# Patient Record
Sex: Female | Born: 1935 | Race: White | Hispanic: No | Marital: Married | Smoking: Never smoker
Health system: Southern US, Community
[De-identification: ages and names within clinical notes are randomized; demographics above are authoritative.]

## PROBLEM LIST (undated history)

## (undated) DIAGNOSIS — K439 Ventral hernia without obstruction or gangrene: Secondary | ICD-10-CM

## (undated) DIAGNOSIS — J9690 Respiratory failure, unspecified, unspecified whether with hypoxia or hypercapnia: Secondary | ICD-10-CM

## (undated) DIAGNOSIS — M1990 Unspecified osteoarthritis, unspecified site: Secondary | ICD-10-CM

## (undated) DIAGNOSIS — I10 Essential (primary) hypertension: Secondary | ICD-10-CM

## (undated) DIAGNOSIS — I219 Acute myocardial infarction, unspecified: Secondary | ICD-10-CM

## (undated) DIAGNOSIS — J45909 Unspecified asthma, uncomplicated: Secondary | ICD-10-CM

## (undated) DIAGNOSIS — K219 Gastro-esophageal reflux disease without esophagitis: Secondary | ICD-10-CM

## (undated) HISTORY — DX: Unspecified asthma, uncomplicated: J45.909

## (undated) HISTORY — PX: TONSILLECTOMY: SUR1361

## (undated) HISTORY — DX: Acute myocardial infarction, unspecified: I21.9

## (undated) HISTORY — PX: TUBAL LIGATION: SHX77

## (undated) HISTORY — PX: APPENDECTOMY: SHX54

## (undated) HISTORY — PX: EYE SURGERY: SHX253

## (undated) HISTORY — PX: TOE SURGERY: SHX1073

## (undated) HISTORY — PX: CHOLECYSTECTOMY: SHX55

---

## 2005-07-13 ENCOUNTER — Ambulatory Visit: Payer: Self-pay | Admitting: Cardiology

## 2005-07-14 ENCOUNTER — Ambulatory Visit: Payer: Self-pay | Admitting: Cardiology

## 2005-07-21 ENCOUNTER — Ambulatory Visit: Payer: Self-pay | Admitting: Cardiology

## 2005-09-10 ENCOUNTER — Ambulatory Visit: Payer: Self-pay | Admitting: Cardiology

## 2010-12-01 ENCOUNTER — Other Ambulatory Visit (INDEPENDENT_AMBULATORY_CARE_PROVIDER_SITE_OTHER): Payer: Self-pay | Admitting: Internal Medicine

## 2010-12-01 ENCOUNTER — Ambulatory Visit (INDEPENDENT_AMBULATORY_CARE_PROVIDER_SITE_OTHER): Payer: Medicare HMO | Admitting: Internal Medicine

## 2010-12-01 DIAGNOSIS — R609 Edema, unspecified: Secondary | ICD-10-CM

## 2010-12-01 DIAGNOSIS — R1011 Right upper quadrant pain: Secondary | ICD-10-CM

## 2010-12-02 ENCOUNTER — Ambulatory Visit (HOSPITAL_COMMUNITY)
Admission: RE | Admit: 2010-12-02 | Discharge: 2010-12-02 | Disposition: A | Discharge status: Home / Self Care | Payer: Medicare HMO | Source: Ambulatory Visit | Attending: Internal Medicine | Admitting: Internal Medicine

## 2010-12-02 DIAGNOSIS — R609 Edema, unspecified: Secondary | ICD-10-CM

## 2010-12-02 DIAGNOSIS — I10 Essential (primary) hypertension: Secondary | ICD-10-CM | POA: Insufficient documentation

## 2010-12-02 DIAGNOSIS — R1011 Right upper quadrant pain: Secondary | ICD-10-CM | POA: Insufficient documentation

## 2011-01-28 ENCOUNTER — Other Ambulatory Visit (INDEPENDENT_AMBULATORY_CARE_PROVIDER_SITE_OTHER): Payer: Self-pay | Admitting: Internal Medicine

## 2011-01-28 ENCOUNTER — Ambulatory Visit (INDEPENDENT_AMBULATORY_CARE_PROVIDER_SITE_OTHER): Payer: Medicare HMO | Admitting: Internal Medicine

## 2011-01-28 DIAGNOSIS — K219 Gastro-esophageal reflux disease without esophagitis: Secondary | ICD-10-CM

## 2011-01-28 DIAGNOSIS — R1310 Dysphagia, unspecified: Secondary | ICD-10-CM

## 2011-02-01 ENCOUNTER — Ambulatory Visit (HOSPITAL_COMMUNITY)
Admission: RE | Admit: 2011-02-01 | Discharge: 2011-02-01 | Disposition: A | Discharge status: Home / Self Care | Payer: Medicare HMO | Source: Ambulatory Visit | Attending: Internal Medicine | Admitting: Internal Medicine

## 2011-02-01 DIAGNOSIS — R1310 Dysphagia, unspecified: Secondary | ICD-10-CM | POA: Insufficient documentation

## 2011-02-01 DIAGNOSIS — K449 Diaphragmatic hernia without obstruction or gangrene: Secondary | ICD-10-CM | POA: Insufficient documentation

## 2011-02-01 DIAGNOSIS — K219 Gastro-esophageal reflux disease without esophagitis: Secondary | ICD-10-CM | POA: Insufficient documentation

## 2011-02-02 ENCOUNTER — Telehealth (INDEPENDENT_AMBULATORY_CARE_PROVIDER_SITE_OTHER): Payer: Self-pay | Admitting: Internal Medicine

## 2011-02-02 NOTE — Telephone Encounter (Signed)
Message left at home for her to return call.  

## 2011-02-11 ENCOUNTER — Telehealth (INDEPENDENT_AMBULATORY_CARE_PROVIDER_SITE_OTHER): Payer: Self-pay | Admitting: Internal Medicine

## 2011-02-11 NOTE — Telephone Encounter (Signed)
Samples of Dexilant will be given to patient x 5 boxes. Message left on her phone

## 2011-11-02 ENCOUNTER — Encounter (HOSPITAL_COMMUNITY): Payer: Self-pay | Admitting: Pharmacy Technician

## 2011-11-03 ENCOUNTER — Encounter (HOSPITAL_COMMUNITY): Payer: Self-pay

## 2011-11-03 ENCOUNTER — Encounter (HOSPITAL_COMMUNITY)
Admission: RE | Admit: 2011-11-03 | Discharge: 2011-11-03 | Disposition: A | Discharge status: Home / Self Care | Payer: Medicare Other | Source: Ambulatory Visit | Attending: Ophthalmology | Admitting: Ophthalmology

## 2011-11-03 HISTORY — DX: Unspecified osteoarthritis, unspecified site: M19.90

## 2011-11-03 HISTORY — DX: Gastro-esophageal reflux disease without esophagitis: K21.9

## 2011-11-03 HISTORY — DX: Essential (primary) hypertension: I10

## 2011-11-03 HISTORY — DX: Ventral hernia without obstruction or gangrene: K43.9

## 2011-11-03 LAB — BASIC METABOLIC PANEL
BUN: 18 mg/dL (ref 6–23)
GFR calc Af Amer: 68 mL/min — ABNORMAL LOW (ref >90)
GFR calc non Af Amer: 59 mL/min — ABNORMAL LOW (ref >90)
Potassium: 4.5 mEq/L (ref 3.5–5.1)
Sodium: 139 mEq/L (ref 135–145)

## 2011-11-03 NOTE — Pre-Procedure Instructions (Signed)
Pt. BP on arrival for pre-op interview was 237/109.  On recheck after 15 min. BP was 207/91.  Pt. States she has been off BP medicine for over a year.  Has recently started taking BP medicine again, today being her first dose.  BP reviewed with Dr. Jayme Cloud & states ok for procedure.  Pt. Encouraged to continue taking BP medicine & to take the morning of procedure.

## 2011-11-03 NOTE — Patient Instructions (Signed)
20 Melissa Ross  11/03/2011   Your procedure is scheduled on:  Tuesday, 11/09/11  Report to [facility] at 0830 AM.  Call this number if you have problems the morning of surgery: [phone]   Remember:   Do not eat food:After Midnight.  May have clear liquids:until Midnight .  Clear liquids include soda, tea, black coffee, apple or grape juice, broth.  Take these medicines the morning of surgery with A SIP OF WATER: nexium and cozaar   Do not wear jewelry, make-up or nail polish.  Do not wear lotions, powders, or perfumes. You may wear deodorant.  Do not shave 48 hours prior to surgery.  Do not bring valuables to the hospital.  Contacts, dentures or bridgework may not be worn into surgery.  Leave suitcase in the car. After surgery it may be brought to your room.  For patients admitted to the hospital, checkout time is 11:00 AM the day of discharge.   Patients discharged the day of surgery will not be allowed to drive home.  Name and phone number of your driver: driver  Special Instructions: Use eye drops as instructed.   Please read over the following fact sheets that you were given: Pain Booklet, Anesthesia Post-op Instructions and Care and Recovery After Surgery   PATIENT INSTRUCTIONS POST-ANESTHESIA  IMMEDIATELY FOLLOWING SURGERY:  Do not drive or operate machinery for the first twenty four hours after surgery.  Do not make any important decisions for twenty four hours after surgery or while taking narcotic pain medications or sedatives.  If you develop intractable nausea and vomiting or a severe headache please notify your doctor immediately.  FOLLOW-UP:  Please make an appointment with your surgeon as instructed. You do not need to follow up with anesthesia unless specifically instructed to do so.  WOUND CARE INSTRUCTIONS (if applicable):  Keep a dry clean dressing on the anesthesia/puncture wound site if there is drainage.  Once the wound has quit draining you may leave it  open to air.  Generally you should leave the bandage intact for twenty four hours unless there is drainage.  If the epidural site drains for more than 36-48 hours please call the anesthesia department.  QUESTIONS?:  Please feel free to call your physician or the hospital operator if you have any questions, and they will be happy to assist you.         Cataract Surgery  A cataract is a clouding of the lens of the eye. When a lens becomes cloudy, vision is reduced based on the degree and nature of the clouding. Surgery may be needed to improve vision. Surgery removes the cloudy lens and usually replaces it with a substitute lens (intraocular lens, IOL). LET YOUR EYE DOCTOR KNOW ABOUT:  Allergies to food or medicine.   Medicines taken including herbs, eyedrops, over-the-counter medicines, and creams.   Use of steroids (by mouth or creams).   Previous problems with anesthetics or numbing medicine.   History of bleeding problems or blood clots.   Previous surgery.   Other health problems, including diabetes and kidney problems.   Possibility of pregnancy, if this applies.  RISKS AND COMPLICATIONS  Infection.   Inflammation of the eyeball (endophthalmitis) that can spread to both eyes (sympathetic ophthalmia).   Poor wound healing.   If an IOL is inserted, it can later fall out of proper position. This is very uncommon.   Clouding of the part of your eye that holds an IOL in place. This is  called an "after-cataract." These are uncommon, but easily treated.  BEFORE THE PROCEDURE  Do not eat or drink anything except small amounts of water for 8 to 12 before your surgery, or as directed by your caregiver.   Unless you are told otherwise, continue any eyedrops you have been prescribed.   Talk to your primary caregiver about all other medicines that you take (both prescription and non-prescription). In some cases, you may need to stop or change medicines near the time of your  surgery. This is most important if you are taking blood-thinning medicine.Do not stop medicines unless you are told to do so.   Arrange for someone to drive you to and from the procedure.   Do not put contact lenses in either eye on the day of your surgery.  PROCEDURE There is more than one method for safely removing a cataract. Your doctor can explain the differences and help determine which is best for you. Phacoemulsification surgery is the most common form of cataract surgery.  An injection is given behind the eye or eyedrops are given to make this a painless procedure.   A small cut (incision) is made on the edge of the clear, dome-shaped surface that covers the front of the eye (cornea).   A tiny probe is painlessly inserted into the eye. This device gives off ultrasound waves that soften and break up the cloudy center of the lens. This makes it easier for the cloudy lens to be removed by suction.   An IOL may be implanted.   The normal lens of the eye is covered by a clear capsule. Part of that capsule is intentionally left in the eye to support the IOL.   Your surgeon may or may not use stitches to close the incision.  There are other forms of cataract surgery that require a larger incision and stiches to close the eye. This approach is taken in cases where the doctor feels that the cataract cannot be easily removed using phacoemulsification. AFTER THE PROCEDURE  When an IOL is implanted, it does not need care. It becomes a permanent part of your eye and cannot be seen or felt.   Your doctor will schedule follow-up exams to check on your progress.   Review your other medicines with your doctor to see which can be resumed after surgery.   Use eyedrops or take medicine as prescribed by your doctor.  Document Released: 06/17/2011 Document Reviewed: 06/14/2011 [facility] Patient Information 2012 [place], [place].

## 2011-11-08 MED ORDER — CYCLOPENTOLATE-PHENYLEPHRINE 0.2-1 % OP SOLN
OPHTHALMIC | Status: AC
  Administered 2011-11-09: 1 [drp] via OPHTHALMIC
  Filled 2011-11-08: qty 2

## 2011-11-08 MED ORDER — FLURBIPROFEN SODIUM 0.03 % OP SOLN
OPHTHALMIC | Status: AC
  Administered 2011-11-09: 1 [drp] via OPHTHALMIC
  Filled 2011-11-08: qty 2.5

## 2011-11-08 MED ORDER — PHENYLEPHRINE HCL 2.5 % OP SOLN
OPHTHALMIC | Status: AC
  Administered 2011-11-09: 1 [drp] via OPHTHALMIC
  Filled 2011-11-08: qty 2

## 2011-11-08 MED ORDER — TETRACAINE HCL 0.5 % OP SOLN
OPHTHALMIC | Status: AC
  Administered 2011-11-09: 1 [drp] via OPHTHALMIC
  Filled 2011-11-08: qty 2

## 2011-11-09 ENCOUNTER — Encounter (HOSPITAL_COMMUNITY): Payer: Self-pay | Admitting: Anesthesiology

## 2011-11-09 ENCOUNTER — Encounter (HOSPITAL_COMMUNITY)
Admission: RE | Disposition: A | Discharge status: Home / Self Care | Payer: Self-pay | Source: Ambulatory Visit | Attending: Ophthalmology

## 2011-11-09 ENCOUNTER — Encounter (HOSPITAL_COMMUNITY): Payer: Self-pay | Admitting: *Deleted

## 2011-11-09 ENCOUNTER — Ambulatory Visit (HOSPITAL_COMMUNITY): Payer: Medicare Other | Admitting: Anesthesiology

## 2011-11-09 ENCOUNTER — Ambulatory Visit (HOSPITAL_COMMUNITY)
Admission: RE | Admit: 2011-11-09 | Discharge: 2011-11-09 | Disposition: A | Discharge status: Home / Self Care | Payer: Medicare Other | Source: Ambulatory Visit | Attending: Ophthalmology | Admitting: Ophthalmology

## 2011-11-09 DIAGNOSIS — Z79899 Other long term (current) drug therapy: Secondary | ICD-10-CM | POA: Insufficient documentation

## 2011-11-09 DIAGNOSIS — Z01812 Encounter for preprocedural laboratory examination: Secondary | ICD-10-CM | POA: Insufficient documentation

## 2011-11-09 DIAGNOSIS — H2510 Age-related nuclear cataract, unspecified eye: Secondary | ICD-10-CM | POA: Insufficient documentation

## 2011-11-09 DIAGNOSIS — I10 Essential (primary) hypertension: Secondary | ICD-10-CM | POA: Insufficient documentation

## 2011-11-09 DIAGNOSIS — Z01810 Encounter for preprocedural cardiovascular examination: Secondary | ICD-10-CM | POA: Insufficient documentation

## 2011-11-09 HISTORY — PX: CATARACT EXTRACTION W/PHACO: SHX586

## 2011-11-09 SURGERY — PHACOEMULSIFICATION, CATARACT, WITH IOL INSERTION
Anesthesia: Monitor Anesthesia Care | Site: Eye | Laterality: Right | Wound class: Clean

## 2011-11-09 MED ORDER — PHENYLEPHRINE HCL 2.5 % OP SOLN
1.0000 [drp] | OPHTHALMIC | Status: AC
  Administered 2011-11-09 (×3): 1 [drp] via OPHTHALMIC

## 2011-11-09 MED ORDER — EPINEPHRINE HCL 1 MG/ML IJ SOLN
INTRAMUSCULAR | Status: AC
  Filled 2011-11-09: qty 1

## 2011-11-09 MED ORDER — MIDAZOLAM HCL 2 MG/2ML IJ SOLN: 1.0000 mg | INTRAMUSCULAR | Status: DC | PRN

## 2011-11-09 MED ORDER — BSS IO SOLN
INTRAOCULAR | Status: DC | PRN
  Administered 2011-11-09: 15 mL via INTRAOCULAR

## 2011-11-09 MED ORDER — LIDOCAINE 3.5 % OP GEL OPTIME - NO CHARGE
OPHTHALMIC | Status: DC | PRN
  Administered 2011-11-09: 2 [drp] via OPHTHALMIC

## 2011-11-09 MED ORDER — PROVISC 10 MG/ML IO SOLN
INTRAOCULAR | Status: DC | PRN
  Administered 2011-11-09: 8.5 mg via INTRAOCULAR

## 2011-11-09 MED ORDER — LACTATED RINGERS IV SOLN
INTRAVENOUS | Status: DC
  Administered 2011-11-09: 10:00:00 via INTRAVENOUS

## 2011-11-09 MED ORDER — ONDANSETRON HCL 4 MG/2ML IJ SOLN: 4.0000 mg | Freq: Once | INTRAMUSCULAR | Status: DC | PRN

## 2011-11-09 MED ORDER — TETRACAINE 0.5 % OP SOLN OPTIME - NO CHARGE
OPHTHALMIC | Status: DC | PRN
  Administered 2011-11-09: 2 [drp] via OPHTHALMIC

## 2011-11-09 MED ORDER — FLURBIPROFEN SODIUM 0.03 % OP SOLN
1.0000 [drp] | OPHTHALMIC | Status: AC
  Administered 2011-11-09 (×3): 1 [drp] via OPHTHALMIC

## 2011-11-09 MED ORDER — LIDOCAINE HCL 3.5 % OP GEL: 1.0000 "application " | Freq: Once | OPHTHALMIC | Status: DC

## 2011-11-09 MED ORDER — TETRACAINE HCL 0.5 % OP SOLN
1.0000 [drp] | OPHTHALMIC | Status: AC
  Administered 2011-11-09 (×3): 1 [drp] via OPHTHALMIC

## 2011-11-09 MED ORDER — FENTANYL CITRATE 0.05 MG/ML IJ SOLN: 25.0000 ug | INTRAMUSCULAR | Status: DC | PRN

## 2011-11-09 MED ORDER — LACTATED RINGERS IV SOLN
INTRAVENOUS | Status: DC
  Administered 2011-11-09: 1000 mL via INTRAVENOUS

## 2011-11-09 MED ORDER — MIDAZOLAM HCL 2 MG/2ML IJ SOLN
1.0000 mg | INTRAMUSCULAR | Status: DC | PRN
  Administered 2011-11-09: 2 mg via INTRAVENOUS

## 2011-11-09 MED ORDER — MIDAZOLAM HCL 2 MG/2ML IJ SOLN
INTRAMUSCULAR | Status: AC
  Administered 2011-11-09: 2 mg via INTRAVENOUS
  Filled 2011-11-09: qty 2

## 2011-11-09 MED ORDER — CYCLOPENTOLATE-PHENYLEPHRINE 0.2-1 % OP SOLN
1.0000 [drp] | OPHTHALMIC | Status: AC
  Administered 2011-11-09 (×3): 1 [drp] via OPHTHALMIC

## 2011-11-09 MED ORDER — EPINEPHRINE HCL 1 MG/ML IJ SOLN
INTRAOCULAR | Status: DC | PRN
  Administered 2011-11-09: 10:00:00

## 2011-11-09 SURGICAL SUPPLY — 23 items
CAPSULAR TENSION RING-AMO (OPHTHALMIC RELATED) IMPLANT
CLOTH BEACON ORANGE TIMEOUT ST (SAFETY) ×2 IMPLANT
EYE SHIELD UNIVERSAL CLEAR (GAUZE/BANDAGES/DRESSINGS) ×4 IMPLANT
GLOVE BIO SURGEON STRL SZ 6.5 (GLOVE) ×2 IMPLANT
GLOVE ECLIPSE 6.5 STRL STRAW (GLOVE) IMPLANT
GLOVE ECLIPSE 7.0 STRL STRAW (GLOVE) IMPLANT
GLOVE EXAM NITRILE LRG STRL (GLOVE) IMPLANT
GLOVE EXAM NITRILE MD LF STRL (GLOVE) ×2 IMPLANT
GLOVE SKINSENSE NS SZ6.5 (GLOVE)
GLOVE SKINSENSE STRL SZ6.5 (GLOVE) IMPLANT
HEALON 5 0.6 ML (INTRAOCULAR LENS) IMPLANT
KIT VITRECTOMY (OPHTHALMIC RELATED) IMPLANT
PAD ARMBOARD 7.5X6 YLW CONV (MISCELLANEOUS) ×2 IMPLANT
PROC W NO LENS (INTRAOCULAR LENS)
PROC W SPEC LENS (INTRAOCULAR LENS)
PROCESS W NO LENS (INTRAOCULAR LENS) IMPLANT
PROCESS W SPEC LENS (INTRAOCULAR LENS) IMPLANT
RING MALYGIN (MISCELLANEOUS) IMPLANT
SIGHTPATH CAT PROC W REG LENS (Ophthalmic Related) ×2 IMPLANT
TAPE SURG TRANSPORE 1 IN (GAUZE/BANDAGES/DRESSINGS) ×1 IMPLANT
TAPE SURGICAL TRANSPORE 1 IN (GAUZE/BANDAGES/DRESSINGS) ×1
VISCOELASTIC ADDITIONAL (OPHTHALMIC RELATED) IMPLANT
WATER STERILE IRR 250ML POUR (IV SOLUTION) ×2 IMPLANT

## 2011-11-09 NOTE — Discharge Instructions (Signed)
Arabell Neria [facility]  11/09/2011           Mollie Germany Care Instructions [address]- [place] [address] [address]      1. Avoid closing eyes tightly. One often closes the eye tightly when laughing, talking, sneezing, coughing or if they feel irritated. At these times, you should be careful not to close your eyes tightly.  2. Instill one drop [place] in operative eye 3 times each day until the bottle is finished. To instill drops in your eye, open it, look up and have someone gently pull the lower lid down and instill a couple of drops inside the lower lid.  3. Do not touch upper lid.  4. Take Advil or Tylenol for pain.  5. You may use either eye for near work, such as reading or sewing and you may watch television.  6. You may have your hair done at the beauty parlor at any time.  7. Wear dark glasses with or without your own glasses if you are in bright light.  8. Call our office at [phone] or [phone] if you have sharp pain in your eye or unusual symptoms.  9. Do not be concerned because vision in the operative eye is not good. It will not be good, no matter how successful the operation, until you get a special lens for it. Your old glasses will not be suited to the new eye that was operated on and you will not be ready for a new lens for about a month.  10. Follow up at the [facility] office.    I have received a copy of the above instructions and will follow them.

## 2011-11-09 NOTE — Anesthesia Preprocedure Evaluation (Addendum)
Anesthesia Evaluation  Patient identified by MRN, date of birth, ID band Patient awake    Reviewed: Allergy & Precautions, H&P , NPO status , Patient's Chart, lab work & pertinent test results  Airway Mallampati: II      Dental  (+) Teeth Intact   Pulmonary neg pulmonary ROS,  breath sounds clear to auscultation        Cardiovascular hypertension, Pt. on medications Rhythm:Regular     Neuro/Psych    GI/Hepatic GERD-  Medicated,  Endo/Other    Renal/GU      Musculoskeletal   Abdominal   Peds  Hematology   Anesthesia Other Findings   Reproductive/Obstetrics                          Anesthesia Physical Anesthesia Plan  ASA: II  Anesthesia Plan: MAC   Post-op Pain Management:    Induction: Intravenous  Airway Management Planned: Nasal Cannula  Additional Equipment:   Intra-op Plan:   Post-operative Plan:   Informed Consent: I have reviewed the patients History and Physical, chart, labs and discussed the procedure including the risks, benefits and alternatives for the proposed anesthesia with the patient or authorized representative who has indicated his/her understanding and acceptance.     Plan Discussed with:   Anesthesia Plan Comments:         Anesthesia Quick Evaluation

## 2011-11-09 NOTE — Transfer of Care (Signed)
Immediate Anesthesia Transfer of Care Note  Patient: Melissa Ross  Procedure(s) Performed: Procedure(s) (LRB): CATARACT EXTRACTION PHACO AND INTRAOCULAR LENS PLACEMENT (IOC) (Right)  Patient Location: PACU and Short Stay  Anesthesia Type: MAC  Level of Consciousness: awake, alert , oriented and patient cooperative  Airway & Oxygen Therapy: Patient Spontanous Breathing  Post-op Assessment: Report given to PACU RN and Patient moving all extremities  Post vital signs: Reviewed and stable  Complications: No apparent anesthesia complications

## 2011-11-09 NOTE — Op Note (Signed)
Patient brought to the operating room and prepped and draped in the usual manner.  Lid speculum inserted in right eye.  Stab incision made at the twelve o'clock position.  Provisc instilled in the anterior chamber.   A 2.4 mm. Stab incision was made temporally.  An anterior capsulotomy was done with a bent 25 gauge needle.  The nucleus was hydrodissected.  The Phaco tip was inserted in the anterior chamber and the nucleus was emulsified.  CDE was 10.01.  The cortical material was then removed with the I and A tip.  Posterior capsule was the polished.  The anterior chamber was deepened with Provisc.  A 24.0 Diopter Rayner 570C IOL was then inserted in the capsular bag.  Provisc was then removed with the I and A tip.  The wound was then hydrated.  Patient sent to the Recovery Room in good condition with follow up in my office.

## 2011-11-09 NOTE — Anesthesia Postprocedure Evaluation (Signed)
  Anesthesia Post-op Note  Patient: Melissa Ross  Procedure(s) Performed: Procedure(s) (LRB): CATARACT EXTRACTION PHACO AND INTRAOCULAR LENS PLACEMENT (IOC) (Right)  Patient Location: PACU and Short Stay  Anesthesia Type: MAC  Level of Consciousness: awake, alert , oriented and patient cooperative  Airway and Oxygen Therapy: Patient Spontanous Breathing  Post-op Pain: 3 /10, mild  Post-op Assessment: Post-op Vital signs reviewed, Patient's Cardiovascular Status Stable, Respiratory Function Stable, Patent Airway and No signs of Nausea or vomiting  Post-op Vital Signs: Reviewed and stable  Complications: No apparent anesthesia complications

## 2011-11-09 NOTE — H&P (Signed)
The patient was re examined and there is no change in the patients condition since the original H and P. 

## 2011-11-11 ENCOUNTER — Encounter (HOSPITAL_COMMUNITY): Payer: Self-pay | Admitting: Ophthalmology

## 2012-05-11 ENCOUNTER — Encounter (HOSPITAL_COMMUNITY): Payer: Self-pay | Admitting: *Deleted

## 2012-05-11 ENCOUNTER — Encounter (HOSPITAL_COMMUNITY)
Admission: RE | Disposition: A | Discharge status: Home / Self Care | Payer: Self-pay | Source: Ambulatory Visit | Attending: Internal Medicine

## 2012-05-11 ENCOUNTER — Other Ambulatory Visit (INDEPENDENT_AMBULATORY_CARE_PROVIDER_SITE_OTHER): Payer: Self-pay | Admitting: *Deleted

## 2012-05-11 ENCOUNTER — Ambulatory Visit (HOSPITAL_COMMUNITY)
Admission: RE | Admit: 2012-05-11 | Discharge: 2012-05-11 | Disposition: A | Discharge status: Home / Self Care | Payer: Medicare Other | Source: Ambulatory Visit | Attending: Internal Medicine | Admitting: Internal Medicine

## 2012-05-11 DIAGNOSIS — IMO0002 Reserved for concepts with insufficient information to code with codable children: Secondary | ICD-10-CM

## 2012-05-11 DIAGNOSIS — K449 Diaphragmatic hernia without obstruction or gangrene: Secondary | ICD-10-CM

## 2012-05-11 DIAGNOSIS — I10 Essential (primary) hypertension: Secondary | ICD-10-CM | POA: Insufficient documentation

## 2012-05-11 DIAGNOSIS — R1310 Dysphagia, unspecified: Secondary | ICD-10-CM | POA: Insufficient documentation

## 2012-05-11 DIAGNOSIS — K2960 Other gastritis without bleeding: Secondary | ICD-10-CM

## 2012-05-11 DIAGNOSIS — K2970 Gastritis, unspecified, without bleeding: Secondary | ICD-10-CM | POA: Insufficient documentation

## 2012-05-11 DIAGNOSIS — K222 Esophageal obstruction: Secondary | ICD-10-CM

## 2012-05-11 DIAGNOSIS — K2990 Gastroduodenitis, unspecified, without bleeding: Secondary | ICD-10-CM | POA: Insufficient documentation

## 2012-05-11 HISTORY — PX: ESOPHAGOGASTRODUODENOSCOPY (EGD) WITH ESOPHAGEAL DILATION: SHX5812

## 2012-05-11 SURGERY — ESOPHAGOGASTRODUODENOSCOPY (EGD) WITH ESOPHAGEAL DILATION
Anesthesia: Moderate Sedation

## 2012-05-11 MED ORDER — MEPERIDINE HCL 50 MG/ML IJ SOLN
INTRAMUSCULAR | Status: AC
  Filled 2012-05-11: qty 1

## 2012-05-11 MED ORDER — MEPERIDINE HCL 25 MG/ML IJ SOLN
INTRAMUSCULAR | Status: DC | PRN
  Administered 2012-05-11 (×2): 25 mg via INTRAVENOUS

## 2012-05-11 MED ORDER — BUTAMBEN-TETRACAINE-BENZOCAINE 2-2-14 % EX AERO
INHALATION_SPRAY | CUTANEOUS | Status: DC | PRN
  Administered 2012-05-11: 2 via TOPICAL

## 2012-05-11 MED ORDER — SODIUM CHLORIDE 0.45 % IV SOLN
INTRAVENOUS | Status: DC
  Administered 2012-05-11: 14:00:00 via INTRAVENOUS

## 2012-05-11 MED ORDER — STERILE WATER FOR IRRIGATION IR SOLN
Status: DC | PRN
  Administered 2012-05-11: 15:00:00

## 2012-05-11 MED ORDER — MIDAZOLAM HCL 5 MG/5ML IJ SOLN
INTRAMUSCULAR | Status: DC | PRN
  Administered 2012-05-11: 2 mg via INTRAVENOUS
  Administered 2012-05-11 (×2): 1 mg via INTRAVENOUS

## 2012-05-11 MED ORDER — MIDAZOLAM HCL 5 MG/5ML IJ SOLN
INTRAMUSCULAR | Status: AC
  Filled 2012-05-11: qty 10

## 2012-05-11 NOTE — H&P (Addendum)
Melissa Ross is an 78 y.o. female.   Chief Complaint: Patient is here for EGD and ED. HPI: Patient is 78 year old Caucasian female with chronic GERD and has had intermittent episodes of dysphagia usually relieved spontaneously. This morning she was eating cornbread and could not swallow. She had hypersalivation. She was seen by Dr. Sherril Ross in sent over here for EGD. She is not having difficulty swallowing saliva anymore. Feels she has pass the food bolus. She complains of frequent burping and occasional nocturnal if she eats late. He does not experience heartburn during the daytime as long as she takes her Nexium. She denies abdominal pain or melena.  Past Medical History  Diagnosis Date  . Hypertension   . GERD (gastroesophageal reflux disease)   . Arthritis   . Ventral hernia     Past Surgical History  Procedure Date  . Cholecystectomy   . Tubal ligation   . Toe surgery     right foot  . Tonsillectomy   . Appendectomy   . Eye surgery     left KPE  . Cataract extraction w/phaco 11/09/2011    Procedure: CATARACT EXTRACTION PHACO AND INTRAOCULAR LENS PLACEMENT (IOC);  Surgeon: Loraine Leriche T. Nile Riggs, MD;  Location: [facility];  Service: Ophthalmology;  Laterality: Right;  CDE 10.01    Family History  Problem Relation Age of Onset  . Malignant hyperthermia Neg Hx   . Pseudochol deficiency Neg Hx   . Hypotension Neg Hx   . Anesthesia problems Neg Hx    Social History:  reports that she has never smoked. She does not have any smokeless tobacco history on file. She reports that she does not drink alcohol or use illicit drugs.  Allergies:  Allergies  Allergen Reactions  . Neosporin (Neomycin-Bacitracin Zn-Polymyx)   . Penicillins     Medications Prior to Admission  Medication Sig Dispense Refill  . cholecalciferol (VITAMIN D) 1000 UNITS tablet Take 1,000 Units by mouth daily.      Marland Kitchen losartan (COZAAR) 25 MG tablet Take 25 mg by mouth daily.      . vitamin B-12 (CYANOCOBALAMIN) 1000 MCG  tablet Take 1,000 mcg by mouth daily.      . vitamin C (ASCORBIC ACID) 500 MG tablet Take 500 mg by mouth daily.      Marland Kitchen esomeprazole (NEXIUM) 40 MG capsule Take 40 mg by mouth daily before breakfast.        No results found for this or any previous visit (from the past 48 hour(s)). No results found.  ROS  Blood pressure 196/94, pulse 85, temperature 97.6 F (36.4 C), temperature source Oral, resp. rate 21, height 5\' 9"  (1.753 m), weight 198 lb (89.812 kg), SpO2 99.00%. Physical Exam  Constitutional: She appears well-developed and well-nourished.  HENT:  Mouth/Throat: Oropharynx is clear and moist.  Eyes: Conjunctivae normal are normal. No scleral icterus.  Neck: No thyromegaly present.  Cardiovascular: Normal rate, regular rhythm and normal heart sounds.   No murmur heard. GI: Soft. She exhibits no distension and no mass. There is no tenderness.       Completely reproducible mid abdominal hernia.  Musculoskeletal: She exhibits no edema.  Lymphadenopathy:    She has no cervical adenopathy.  Neurological: She is alert.  Skin: Skin is warm and dry.     Assessment/Plan Solid food dysphagia. Food impaction seem to have cleared spontaneously. EGD with ED.  Melissa Ross U 05/11/2012, 3:17 PM

## 2012-05-11 NOTE — Op Note (Signed)
EGD PROCEDURE REPORT  PATIENT:  Melissa Ross  MR#:  [id number] Birthdate:  1931-01-01, 78 y.o., female Endoscopist:  Dr. Malissa Hippo, MD Referred By:  Dr. Ignatius Specking, MD Procedure Date: 05/11/2012  Procedure:   EGD with ED.  Indications:  Patient is 78 year old Caucasian female with chronic GERD who is been having intermittent solid food dysphagia. She presented to her primary care physician Dr. Sherril Croon this morning with signs and symptoms of food impaction. She she is able to swallow saliva. It is felt that she has spontaneously passed food bolus. She is undergoing therapeutic EGD.            Informed Consent:  The risks, benefits, alternatives & imponderables which include, but are not limited to, bleeding, infection, perforation, drug reaction and potential missed lesion have been reviewed.  The potential for biopsy, lesion removal, esophageal dilation, etc. have also been discussed.  Questions have been answered.  All parties agreeable.  Please see history & physical in medical record for more information.  Medications:  Demerol 50 mg IV Versed 4 mg IV Cetacaine spray topically for oropharyngeal anesthesia  Description of procedure:  The endoscope was introduced through the mouth and advanced to the second portion of the duodenum without difficulty or limitations. The mucosal surfaces were surveyed very carefully during advancement of the scope and upon withdrawal.  Findings:  Esophagus:  Mucosa of the esophagus was normal. Somewhat tortuous body. Stricture noted at GE junction with focal erythema. I was able to pass the scope across it without any difficulty. GEJ:  32 cm Hiatus:  36 cm Stomach: There was some bile in the stomach which was suctioned out. Stomach distended very well with insufflation. Folds in the proximal stomach were normal. Examination mucosa at body was normal. Few small telangiectasia noted along with areas of erythema and granularity. Pyloric channel was  patent. Angularis fundus and cardia were examined by retroflexing the scope. Hernia was easily seen on this view.  Duodenum:  Normal bulbar and post bulbar mucosa.  Therapeutic/Diagnostic Maneuvers Performed:  Distal esophageal stricture was dilated with a balloon. Balloon dilator was passed of the scope. Guidewire was pushed into gastric lumen. Balloon dilator was positioned across the stricture and insufflated to a diameter of 18 mm and subsequently 19 mm. Long was passed distally. Mucosal disruption noted at 2 and 6 o'clock. Balloon was deflated and withdrawn. Stomach was decompressed the endoscope was withdrawn.  Complications:  None  Impression:  Stricture at GE junction. Was dilated with a balloon to 19 mm. Moderate size sliding hiatal hernia. Few antral telangiectasia along with changes of nonerosive gastritis.  Recommendations:  Standard instructions given. H. pylori serology.   Keishon Chavarin U  05/11/2012  3:59 PM  CC: Dr. Ignatius Specking., MD & Dr. Bonnetta Barry ref. provider found

## 2012-05-12 LAB — H. PYLORI ANTIBODY, IGG: H Pylori IgG: 1.08 {ISR} — ABNORMAL HIGH

## 2012-05-18 ENCOUNTER — Other Ambulatory Visit (INDEPENDENT_AMBULATORY_CARE_PROVIDER_SITE_OTHER): Payer: Self-pay | Admitting: Internal Medicine

## 2012-05-18 DIAGNOSIS — A048 Other specified bacterial intestinal infections: Secondary | ICD-10-CM

## 2012-05-18 MED ORDER — BIS SUBCIT-METRONID-TETRACYC 140-125-125 MG PO CAPS: 3.0000 | ORAL_CAPSULE | Freq: Three times a day (TID) | ORAL | Status: DC

## 2012-05-22 ENCOUNTER — Telehealth (INDEPENDENT_AMBULATORY_CARE_PROVIDER_SITE_OTHER): Payer: Self-pay | Admitting: *Deleted

## 2012-05-22 NOTE — Telephone Encounter (Signed)
Patient called and states that she got the Pylera, after reading the side effects bad side effects she is very concerned about taking the medication. She states in her message that she has some problems already and does not want to take any chances. Ms. Rundell is asking for a call back to help her understand this medication. I call her at the number she provided and it went to voicemail. Told her that Dr.Rehman would be made aware and either he or myself would call her back.

## 2012-05-23 NOTE — Telephone Encounter (Signed)
Per Dr.Rehman on 05/24/11 may call in Prevpac , Use as directed #1 This was called to Melissa Ross's/Melissa Ross Patient was called and a message was left, asking that she call me.

## 2012-06-22 ENCOUNTER — Encounter (INDEPENDENT_AMBULATORY_CARE_PROVIDER_SITE_OTHER): Payer: Self-pay | Admitting: General Surgery

## 2012-06-22 ENCOUNTER — Ambulatory Visit (INDEPENDENT_AMBULATORY_CARE_PROVIDER_SITE_OTHER): Payer: Medicare Other | Admitting: General Surgery

## 2012-06-22 VITALS — BP 120/72 | HR 84 | Resp 16 | Ht 68.0 in | Wt 200.0 lb

## 2012-06-22 DIAGNOSIS — K432 Incisional hernia without obstruction or gangrene: Secondary | ICD-10-CM | POA: Insufficient documentation

## 2012-06-22 NOTE — Patient Instructions (Signed)
We will get a CT scan to investigate the hernia and I will call you with the results

## 2012-06-22 NOTE — Progress Notes (Signed)
Patient ID: Melissa Ross, female   DOB: [birth year], 78 y.o.   MRN: [id number]  Chief Complaint  Patient presents with  . Hernia    HPI Melissa Ross is a 78 y.o. female.   HPI 78 year old Caucasian female comes in complaining of abdominal hernia. She states that shortly after her gallbladder surgery in 2010 she noticed a small bulge underneath her supraumbilical incision. She states over the past several years the bulge has gotten significantly larger. It tends to cause her more pain and discomfort first thing in the morning when she gets up. She states that it looks like her stomach is about to fall out of her abdomen. She states that it also causes discomfort when she coughs. She has chronic constipation-having one bowel movement per week. She also has significant reflux. She recently had her esophagus dilated at the end of October. Since having her esophagus dilated she hasn't had as much vomiting. She denies any weight loss. She denies any melena or hematochezia. She has had a prior tubal ligation and appendectomy. Her gallbladder surgery was performed at [facility]. Past Medical History  Diagnosis Date  . Hypertension   . GERD (gastroesophageal reflux disease)   . Arthritis   . Ventral hernia   . Asthma     Past Surgical History  Procedure Date  . Cholecystectomy   . Tubal ligation   . Toe surgery     right foot  . Tonsillectomy   . Appendectomy   . Eye surgery     left KPE  . Cataract extraction w/phaco 11/09/2011    Procedure: CATARACT EXTRACTION PHACO AND INTRAOCULAR LENS PLACEMENT (IOC);  Surgeon: Loraine Leriche T. Nile Riggs, MD;  Location: [facility];  Service: Ophthalmology;  Laterality: Right;  CDE 10.01  . Esophagogastroduodenoscopy (egd) with esophageal dilation 05/11/2012    Procedure: ESOPHAGOGASTRODUODENOSCOPY (EGD) WITH ESOPHAGEAL DILATION;  Surgeon: Malissa Hippo, MD;  Location: [facility] END[facility] SUITE;  Service: Endoscopy;  Laterality: N/A;    Family History  Problem  Relation Age of Onset  . Malignant hyperthermia Neg Hx   . Pseudochol deficiency Neg Hx   . Hypotension Neg Hx   . Anesthesia problems Neg Hx   . Breast cancer    . Ovarian cancer    . Colon cancer      Social History History  Substance Use Topics  . Smoking status: Never Smoker   . Smokeless tobacco: Not on file  . Alcohol Use: No    Allergies  Allergen Reactions  . Neosporin (Neomycin-Bacitracin Zn-Polymyx)   . Penicillins     Current Outpatient Prescriptions  Medication Sig Dispense Refill  . cholecalciferol (VITAMIN D) 1000 UNITS tablet Take 1,000 Units by mouth daily.      Marland Kitchen esomeprazole (NEXIUM) 40 MG capsule Take 40 mg by mouth daily before breakfast.      . losartan (COZAAR) 25 MG tablet Take 25 mg by mouth daily.      . vitamin B-12 (CYANOCOBALAMIN) 1000 MCG tablet Take 1,000 mcg by mouth daily.      . vitamin C (ASCORBIC ACID) 500 MG tablet Take 500 mg by mouth daily.        Review of Systems Review of Systems  Constitutional: Negative for fever, chills, activity change, appetite change and unexpected weight change.  HENT: Positive for hearing loss. Negative for facial swelling and neck pain.   Eyes: Negative for photophobia and visual disturbance.  Respiratory: Negative for chest tightness and shortness of breath.   Cardiovascular:  Positive for leg swelling. Negative for chest pain and palpitations.       Denies CP, SOB, orthopnea, PND. Sleeps on 4 pillows b/c of reflux  Gastrointestinal: Positive for vomiting and constipation. Negative for nausea and diarrhea.       See hpi  Genitourinary: Negative for dysuria and difficulty urinating.  Musculoskeletal:       OA pains  Neurological: Negative for dizziness, tremors, seizures, weakness, light-headedness and numbness.       No TIA, amaurosis fugax  Hematological: Does not bruise/bleed easily.  Psychiatric/Behavioral: Negative for confusion.    Blood pressure 120/72, pulse 84, resp. rate 16, height 5\' 8"   (1.727 m), weight 200 lb (90.719 kg).  Physical Exam Physical Exam  Vitals reviewed. Constitutional: She is oriented to person, place, and time. She appears well-developed and well-nourished. No distress.       obese  HENT:  Head: Normocephalic and atraumatic.  Right Ear: External ear normal.  Left Ear: External ear normal.  Eyes: Conjunctivae normal are normal. No scleral icterus.  Neck: Normal range of motion. Neck supple. No tracheal deviation present. No thyromegaly present.  Cardiovascular: Normal rate, regular rhythm, normal heart sounds and intact distal pulses.   Pulmonary/Chest: Effort normal and breath sounds normal. No respiratory distress. She has no wheezes. She has no rales.  Abdominal: Soft. Bowel sounds are normal. She exhibits no distension. There is no rebound and no guarding.         Examined supine and standing - small supraumbilical incision. Bulge above umbilicus, more pronounced standing; reducible. Difficult to palpate fasical borders. Some component of diastasis in upper midline. Mild TTP over hernia  Musculoskeletal: She exhibits no edema and no tenderness.  Neurological: She is alert and oriented to person, place, and time. She exhibits normal muscle tone.  Skin: Skin is warm and dry. No rash noted. She is not diaphoretic. No erythema.  Psychiatric: She has a normal mood and affect. Her behavior is normal. Judgment and thought content normal.    Data Reviewed EGD note - esophageal stricture - dilated  Assessment    Incisional ventral hernia Diastasis of upper midline    Plan    We discussed the etiology of ventral hernias. We discussed the signs and symptoms of incarceration and strangulation. The patient was given educational material. I also drew diagrams. She also has some evidence of a diastases of the hernia which makes it a little bit challenging on physical exam to delineate the fascial defect.  We discussed nonoperative and operative  management. With respect to operative management, we discussed both open repair and laparoscopic repair. We discussed the pros and cons of each approach. I discussed the typical aftercare with each procedure and how each procedure differs.  The patient has elected to first have a CT scan of her abdomen pelvis to delineate her abdominal wall anatomy. She is leaning toward a laparoscopic repair.  We discussed the risk and benefits of surgery including but not limited to bleeding, infection, injury to surrounding structures, hernia recurrence, mesh complications, hematoma/seroma formation, need to convert to an open procedure, blood clot formation, urinary retention, post operative ileus, general anesthesia risk, long-term abdominal pain. We discussed that this procedure can be quite uncomfortable and difficult to recover from based on how the mesh is secured to the abdominal wall. We discussed the importance of avoiding heavy lifting and straining for a period of 6 weeks.  She will be scheduled for a CT scan next week and I  will call her with the results and we will go from there. She was accompanied by her son today who is in agreement with the plan  [name]. Andrey Campanile, MD, FACS General, Bariatric, & Minimally Invasive Surgery [facility], [place]        [facility] M 06/22/2012, 2:09 PM

## 2012-06-26 ENCOUNTER — Ambulatory Visit
Admission: RE | Admit: 2012-06-26 | Discharge: 2012-06-26 | Disposition: A | Discharge status: Home / Self Care | Payer: Medicare Other | Source: Ambulatory Visit | Attending: General Surgery | Admitting: General Surgery

## 2012-06-26 DIAGNOSIS — K432 Incisional hernia without obstruction or gangrene: Secondary | ICD-10-CM

## 2012-06-26 MED ORDER — IOHEXOL 300 MG/ML  SOLN
125.0000 mL | Freq: Once | INTRAMUSCULAR | Status: AC | PRN
  Administered 2012-06-26: 125 mL via INTRAVENOUS

## 2012-06-28 ENCOUNTER — Telehealth (INDEPENDENT_AMBULATORY_CARE_PROVIDER_SITE_OTHER): Payer: Self-pay | Admitting: General Surgery

## 2012-06-28 NOTE — Telephone Encounter (Signed)
Spoke with patient and made her aware CT does show hernia. She does not know whether she wants a laparoscopic or open repair. He wants to know exactly how large this area is and wants to know what all her other organs look like. She wanted to speak directly with Dr Andrey Campanile about her scan and she told me she would let him know which surgery she wants. I told her I would pass the message to him.

## 2012-06-28 NOTE — Telephone Encounter (Signed)
Message copied by Liliana Cline on Wed Jun 28, 2012  3:29 PM ------      Message from: Zacarias Pontes      Created: Wed Jun 28, 2012  3:11 PM       Pt would like a call back with her CT Scan results please...[phone]..thanks

## 2012-06-29 ENCOUNTER — Other Ambulatory Visit (INDEPENDENT_AMBULATORY_CARE_PROVIDER_SITE_OTHER): Payer: Self-pay | Admitting: General Surgery

## 2012-06-29 NOTE — Progress Notes (Signed)
Patient ID: Melissa Ross, female   DOB: 04/19/1931, 78 y.o.   MRN: [id number] I spoke with patient and just now to discuss the results of her CT scan which confirmed our suspicions of a ventral incisional hernia. I explained that it roughly measured 5 cm x 4.5 cm. I explained that it contained a portion of colon that appeared to be nonobstructive. The patient has agreed to proceed with surgical repair. She did not have a hernia in her upper midline area. She just has this hernia supraumbilically. I explained that it would be performed after the first of the year. We reviewed signs and symptoms of incarceration strangulation. I encouraged the patient to contact our office should she have additional questions. Our schedulers will contact the patient to schedule surgery.

## 2012-07-03 ENCOUNTER — Encounter (HOSPITAL_COMMUNITY): Payer: Self-pay | Admitting: Pharmacy Technician

## 2012-07-11 ENCOUNTER — Ambulatory Visit (HOSPITAL_COMMUNITY)
Admission: RE | Admit: 2012-07-11 | Discharge: 2012-07-11 | Disposition: A | Discharge status: Home / Self Care | Payer: Medicare Other | Source: Ambulatory Visit | Attending: General Surgery | Admitting: General Surgery

## 2012-07-11 ENCOUNTER — Encounter (HOSPITAL_COMMUNITY)
Admission: RE | Admit: 2012-07-11 | Discharge: 2012-07-11 | Disposition: A | Discharge status: Home / Self Care | Payer: Medicare Other | Source: Ambulatory Visit | Attending: General Surgery | Admitting: General Surgery

## 2012-07-11 ENCOUNTER — Encounter (HOSPITAL_COMMUNITY): Payer: Self-pay

## 2012-07-11 DIAGNOSIS — Z01818 Encounter for other preprocedural examination: Secondary | ICD-10-CM | POA: Insufficient documentation

## 2012-07-11 DIAGNOSIS — K449 Diaphragmatic hernia without obstruction or gangrene: Secondary | ICD-10-CM | POA: Insufficient documentation

## 2012-07-11 LAB — CBC WITH DIFFERENTIAL/PLATELET
Eosinophils Absolute: 0.2 10*3/uL (ref 0.0–0.7)
Lymphs Abs: 1.5 10*3/uL (ref 0.7–4.0)
MCH: 30.3 pg (ref 26.0–34.0)
Neutrophils Relative %: 51 % (ref 43–77)
Platelets: 265 10*3/uL (ref 150–400)
RBC: 4.42 MIL/uL (ref 3.87–5.11)
WBC: 4.8 10*3/uL (ref 4.0–10.5)

## 2012-07-11 LAB — COMPREHENSIVE METABOLIC PANEL
ALT: 12 U/L (ref 0–35)
Albumin: 4 g/dL (ref 3.5–5.2)
Alkaline Phosphatase: 154 U/L — ABNORMAL HIGH (ref 39–117)
GFR calc Af Amer: 56 mL/min — ABNORMAL LOW (ref >90)
Glucose, Bld: 113 mg/dL — ABNORMAL HIGH (ref 70–99)
Potassium: 3.5 mEq/L (ref 3.5–5.1)
Sodium: 140 mEq/L (ref 135–145)
Total Protein: 7.2 g/dL (ref 6.0–8.3)

## 2012-07-11 LAB — SURGICAL PCR SCREEN: MRSA, PCR: NEGATIVE

## 2012-07-11 NOTE — Progress Notes (Signed)
Pt c/o n/v yest and today. She is to call dos if this cont. Surgery is 07/17/12

## 2012-07-11 NOTE — Pre-Procedure Instructions (Signed)
20 Melissa Ross  07/11/2012   Your procedure is scheduled on:  07/17/12  Report to [facility] at 900 AM.  Call this number if you have problems the morning of surgery: [phone]   Remember:   Do not eat food:After Midnight.  Take these medicines the morning of surgery with A SIP OF WATER: nexium   Do not wear jewelry, make-up or nail polish.  Do not wear lotions, powders, or perfumes. You may wear deodorant.  Do not shave 48 hours prior to surgery. Men may shave face and neck.  Do not bring valuables to the hospital.  Contacts, dentures or bridgework may not be worn into surgery.  Leave suitcase in the car. After surgery it may be brought to your room.  For patients admitted to the hospital, checkout time is 11:00 AM the day of discharge.   Patients discharged the day of surgery will not be allowed to drive home.  Name and phone number of your driver: family  Special Instructions: Shower using CHG 2 nights before surgery and the night before surgery.  If you shower the day of surgery use CHG.  Use special wash - you have one bottle of CHG for all showers.  You should use approximately 1/3 of the bottle for each shower.   Please read over the following fact sheets that you were given: Pain Booklet, Coughing and Deep Breathing, MRSA Information and Surgical Site Infection Prevention

## 2012-07-14 NOTE — Progress Notes (Signed)
Patient notified of new arrival time of 05:30 

## 2012-07-16 MED ORDER — VANCOMYCIN HCL IN DEXTROSE 1-5 GM/200ML-% IV SOLN
1000.0000 mg | INTRAVENOUS | Status: AC
  Administered 2012-07-17: 1000 mg via INTRAVENOUS
  Filled 2012-07-16: qty 200

## 2012-07-17 ENCOUNTER — Inpatient Hospital Stay (HOSPITAL_COMMUNITY)
Admission: RE | Admit: 2012-07-17 | Discharge: 2012-07-20 | DRG: 355 | Disposition: A | Discharge status: Skilled Nursing Facility | Payer: Medicare Other | Source: Ambulatory Visit | Attending: General Surgery | Admitting: General Surgery

## 2012-07-17 ENCOUNTER — Encounter (HOSPITAL_COMMUNITY): Payer: Self-pay | Admitting: Anesthesiology

## 2012-07-17 ENCOUNTER — Encounter (HOSPITAL_COMMUNITY): Payer: Self-pay | Admitting: General Practice

## 2012-07-17 ENCOUNTER — Encounter (HOSPITAL_COMMUNITY)
Admission: RE | Disposition: A | Discharge status: Skilled Nursing Facility | Payer: Self-pay | Source: Ambulatory Visit | Attending: General Surgery

## 2012-07-17 ENCOUNTER — Inpatient Hospital Stay (HOSPITAL_COMMUNITY): Payer: Medicare Other | Admitting: Anesthesiology

## 2012-07-17 DIAGNOSIS — K432 Incisional hernia without obstruction or gangrene: Secondary | ICD-10-CM

## 2012-07-17 DIAGNOSIS — H9190 Unspecified hearing loss, unspecified ear: Secondary | ICD-10-CM | POA: Diagnosis present

## 2012-07-17 DIAGNOSIS — J45909 Unspecified asthma, uncomplicated: Secondary | ICD-10-CM | POA: Diagnosis present

## 2012-07-17 DIAGNOSIS — Z888 Allergy status to other drugs, medicaments and biological substances status: Secondary | ICD-10-CM

## 2012-07-17 DIAGNOSIS — M129 Arthropathy, unspecified: Secondary | ICD-10-CM | POA: Diagnosis present

## 2012-07-17 DIAGNOSIS — Z880 Allergy status to penicillin: Secondary | ICD-10-CM

## 2012-07-17 DIAGNOSIS — R1110 Vomiting, unspecified: Secondary | ICD-10-CM | POA: Diagnosis present

## 2012-07-17 DIAGNOSIS — K219 Gastro-esophageal reflux disease without esophagitis: Secondary | ICD-10-CM | POA: Diagnosis present

## 2012-07-17 DIAGNOSIS — Z79899 Other long term (current) drug therapy: Secondary | ICD-10-CM

## 2012-07-17 DIAGNOSIS — K5909 Other constipation: Secondary | ICD-10-CM | POA: Diagnosis present

## 2012-07-17 DIAGNOSIS — I10 Essential (primary) hypertension: Secondary | ICD-10-CM | POA: Diagnosis present

## 2012-07-17 DIAGNOSIS — Z9089 Acquired absence of other organs: Secondary | ICD-10-CM

## 2012-07-17 HISTORY — PX: INSERTION OF MESH: SHX5868

## 2012-07-17 HISTORY — PX: VENTRAL HERNIA REPAIR: SHX424

## 2012-07-17 SURGERY — REPAIR, HERNIA, VENTRAL, LAPAROSCOPIC
Anesthesia: General | Site: Abdomen | Wound class: Clean

## 2012-07-17 MED ORDER — LIDOCAINE HCL (CARDIAC) 20 MG/ML IV SOLN
INTRAVENOUS | Status: DC | PRN
  Administered 2012-07-17: 100 mg via INTRAVENOUS

## 2012-07-17 MED ORDER — PROMETHAZINE HCL 25 MG/ML IJ SOLN
6.2500 mg | INTRAMUSCULAR | Status: DC | PRN
  Administered 2012-07-17: 12.5 mg via INTRAVENOUS

## 2012-07-17 MED ORDER — SUCCINYLCHOLINE CHLORIDE 20 MG/ML IJ SOLN
INTRAMUSCULAR | Status: DC | PRN
  Administered 2012-07-17: 100 mg via INTRAVENOUS

## 2012-07-17 MED ORDER — OXYCODONE HCL 5 MG PO TABS: 5.0000 mg | ORAL_TABLET | Freq: Once | ORAL | Status: DC | PRN

## 2012-07-17 MED ORDER — BUPIVACAINE 0.25 % ON-Q PUMP DUAL CATH 300 ML
300.0000 mL | INJECTION | Status: DC
  Filled 2012-07-17: qty 300

## 2012-07-17 MED ORDER — BUPIVACAINE 0.25 % ON-Q PUMP SINGLE CATH 300ML
INJECTION | Status: DC | PRN
  Administered 2012-07-17: 300 mL

## 2012-07-17 MED ORDER — LABETALOL HCL 5 MG/ML IV SOLN
INTRAVENOUS | Status: AC
  Filled 2012-07-17: qty 4

## 2012-07-17 MED ORDER — PROPOFOL 10 MG/ML IV BOLUS
INTRAVENOUS | Status: DC | PRN
  Administered 2012-07-17: 170 mg via INTRAVENOUS

## 2012-07-17 MED ORDER — KCL IN DEXTROSE-NACL 20-5-0.45 MEQ/L-%-% IV SOLN
INTRAVENOUS | Status: DC
Start: 2012-07-17 — End: 2012-07-20
  Administered 2012-07-17 – 2012-07-18 (×4): via INTRAVENOUS
  Filled 2012-07-17 (×8): qty 1000

## 2012-07-17 MED ORDER — BUPIVACAINE-EPINEPHRINE PF 0.25-1:200000 % IJ SOLN
INTRAMUSCULAR | Status: AC
  Filled 2012-07-17: qty 30

## 2012-07-17 MED ORDER — ROCURONIUM BROMIDE 100 MG/10ML IV SOLN
INTRAVENOUS | Status: DC | PRN
  Administered 2012-07-17: 40 mg via INTRAVENOUS

## 2012-07-17 MED ORDER — HYDROMORPHONE HCL PF 1 MG/ML IJ SOLN
INTRAMUSCULAR | Status: AC
  Filled 2012-07-17: qty 1

## 2012-07-17 MED ORDER — SODIUM CHLORIDE 0.9 % IV SOLN
INTRAVENOUS | Status: DC | PRN
  Administered 2012-07-17: 08:00:00 via INTRAVENOUS

## 2012-07-17 MED ORDER — LOSARTAN POTASSIUM 25 MG PO TABS
25.0000 mg | ORAL_TABLET | Freq: Every day | ORAL | Status: DC
  Administered 2012-07-17 – 2012-07-20 (×4): 25 mg via ORAL
  Filled 2012-07-17 (×4): qty 1

## 2012-07-17 MED ORDER — NEOSTIGMINE METHYLSULFATE 1 MG/ML IJ SOLN
INTRAMUSCULAR | Status: DC | PRN
  Administered 2012-07-17: 4 mg via INTRAVENOUS

## 2012-07-17 MED ORDER — ONDANSETRON HCL 4 MG/2ML IJ SOLN
4.0000 mg | Freq: Four times a day (QID) | INTRAMUSCULAR | Status: DC | PRN
  Administered 2012-07-19: 4 mg via INTRAVENOUS
  Filled 2012-07-17: qty 2

## 2012-07-17 MED ORDER — ONDANSETRON HCL 4 MG PO TABS: 4.0000 mg | ORAL_TABLET | Freq: Four times a day (QID) | ORAL | Status: DC | PRN

## 2012-07-17 MED ORDER — ONDANSETRON HCL 4 MG/2ML IJ SOLN
INTRAMUSCULAR | Status: DC | PRN
  Administered 2012-07-17: 4 mg via INTRAVENOUS

## 2012-07-17 MED ORDER — BUPIVACAINE 0.25 % ON-Q PUMP SINGLE CATH 300ML
300.0000 mL | INJECTION | Status: DC
  Filled 2012-07-17: qty 300

## 2012-07-17 MED ORDER — PROMETHAZINE HCL 25 MG/ML IJ SOLN
INTRAMUSCULAR | Status: AC
  Filled 2012-07-17: qty 1

## 2012-07-17 MED ORDER — SODIUM CHLORIDE 0.9 % IR SOLN
Status: DC | PRN
  Administered 2012-07-17: 1000 mL

## 2012-07-17 MED ORDER — HEPARIN SODIUM (PORCINE) 5000 UNIT/ML IJ SOLN
5000.0000 [IU] | Freq: Three times a day (TID) | INTRAMUSCULAR | Status: DC
  Administered 2012-07-17 – 2012-07-20 (×8): 5000 [IU] via SUBCUTANEOUS
  Filled 2012-07-17 (×11): qty 1

## 2012-07-17 MED ORDER — ACETAMINOPHEN 10 MG/ML IV SOLN
1000.0000 mg | Freq: Four times a day (QID) | INTRAVENOUS | Status: AC
  Administered 2012-07-17 – 2012-07-18 (×4): 1000 mg via INTRAVENOUS
  Filled 2012-07-17 (×4): qty 100

## 2012-07-17 MED ORDER — OXYCODONE HCL 5 MG/5ML PO SOLN: 5.0000 mg | Freq: Once | ORAL | Status: DC | PRN

## 2012-07-17 MED ORDER — HYDROMORPHONE HCL PF 1 MG/ML IJ SOLN
0.2500 mg | INTRAMUSCULAR | Status: DC | PRN
  Administered 2012-07-17 (×4): 0.5 mg via INTRAVENOUS

## 2012-07-17 MED ORDER — LACTATED RINGERS IV SOLN
INTRAVENOUS | Status: DC | PRN
  Administered 2012-07-17 (×2): via INTRAVENOUS

## 2012-07-17 MED ORDER — BUPIVACAINE-EPINEPHRINE 0.25% -1:200000 IJ SOLN
INTRAMUSCULAR | Status: DC | PRN
  Administered 2012-07-17: 14 mL

## 2012-07-17 MED ORDER — CHLORHEXIDINE GLUCONATE 4 % EX LIQD: 1.0000 "application " | Freq: Once | CUTANEOUS | Status: DC

## 2012-07-17 MED ORDER — 0.9 % SODIUM CHLORIDE (POUR BTL) OPTIME
TOPICAL | Status: DC | PRN
  Administered 2012-07-17: 1000 mL

## 2012-07-17 MED ORDER — MORPHINE SULFATE 2 MG/ML IJ SOLN
1.0000 mg | INTRAMUSCULAR | Status: DC | PRN
  Administered 2012-07-17 – 2012-07-18 (×3): 2 mg via INTRAVENOUS
  Filled 2012-07-17 (×3): qty 1

## 2012-07-17 MED ORDER — FENTANYL CITRATE 0.05 MG/ML IJ SOLN
INTRAMUSCULAR | Status: DC | PRN
  Administered 2012-07-17: 50 ug via INTRAVENOUS
  Administered 2012-07-17: 100 ug via INTRAVENOUS
  Administered 2012-07-17 (×2): 50 ug via INTRAVENOUS

## 2012-07-17 MED ORDER — LABETALOL HCL 5 MG/ML IV SOLN
5.0000 mg | INTRAVENOUS | Status: AC | PRN
  Administered 2012-07-17 (×4): 5 mg via INTRAVENOUS

## 2012-07-17 MED ORDER — PANTOPRAZOLE SODIUM 40 MG IV SOLR
40.0000 mg | INTRAVENOUS | Status: DC
  Administered 2012-07-17 – 2012-07-19 (×3): 40 mg via INTRAVENOUS
  Filled 2012-07-17 (×4): qty 40

## 2012-07-17 MED ORDER — GLYCOPYRROLATE 0.2 MG/ML IJ SOLN
INTRAMUSCULAR | Status: DC | PRN
  Administered 2012-07-17: 0.6 mg via INTRAVENOUS

## 2012-07-17 MED ORDER — BUPIVACAINE-EPINEPHRINE 0.25% -1:200000 IJ SOLN
INTRAMUSCULAR | Status: AC
  Filled 2012-07-17: qty 1

## 2012-07-17 SURGICAL SUPPLY — 55 items
ADH SKN CLS APL DERMABOND .7 (GAUZE/BANDAGES/DRESSINGS) ×1
APPLIER CLIP ROT 10 11.4 M/L (STAPLE) ×2
APR CLP MED LRG 11.4X10 (STAPLE) ×1
BINDER ABD UNIV 12 45-62 (WOUND CARE) ×1 IMPLANT
BINDER ABDOMINAL 46IN 62IN (WOUND CARE) ×2
BLADE SURG ROTATE 9660 (MISCELLANEOUS) IMPLANT
CANISTER SUCTION 2500CC (MISCELLANEOUS) ×2 IMPLANT
CATH KIT ON Q 10IN SLV (PAIN MANAGEMENT) ×2 IMPLANT
CATH KIT ON Q 5IN SLV (PAIN MANAGEMENT) ×4 IMPLANT
CHLORAPREP W/TINT 26ML (MISCELLANEOUS) ×2 IMPLANT
CLIP APPLIE ROT 10 11.4 M/L (STAPLE) ×1 IMPLANT
CLOTH BEACON ORANGE TIMEOUT ST (SAFETY) ×2 IMPLANT
COVER SURGICAL LIGHT HANDLE (MISCELLANEOUS) ×2 IMPLANT
DECANTER SPIKE VIAL GLASS SM (MISCELLANEOUS) ×2 IMPLANT
DERMABOND ADVANCED (GAUZE/BANDAGES/DRESSINGS) ×1
DERMABOND ADVANCED .7 DNX12 (GAUZE/BANDAGES/DRESSINGS) ×1 IMPLANT
DEVICE SECURE STRAP 25 ABSORB (INSTRUMENTS) ×4 IMPLANT
DEVICE TROCAR PUNCTURE CLOSURE (ENDOMECHANICALS) ×2 IMPLANT
DRAPE UTILITY 15X26 W/TAPE STR (DRAPE) ×4 IMPLANT
DRAPE WARM FLUID 44X44 (DRAPE) ×2 IMPLANT
ELECT REM PT RETURN 9FT ADLT (ELECTROSURGICAL) ×2
ELECTRODE REM PT RTRN 9FT ADLT (ELECTROSURGICAL) ×1 IMPLANT
GLOVE BIO SURGEON STRL SZ7 (GLOVE) ×2 IMPLANT
GLOVE BIO SURGEON STRL SZ7.5 (GLOVE) ×6 IMPLANT
GLOVE BIOGEL M STRL SZ7.5 (GLOVE) ×2 IMPLANT
GLOVE BIOGEL PI IND STRL 6.5 (GLOVE) ×2 IMPLANT
GLOVE BIOGEL PI IND STRL 7.5 (GLOVE) ×2 IMPLANT
GLOVE BIOGEL PI IND STRL 8 (GLOVE) ×1 IMPLANT
GLOVE BIOGEL PI INDICATOR 6.5 (GLOVE) ×2
GLOVE BIOGEL PI INDICATOR 7.5 (GLOVE) ×2
GLOVE BIOGEL PI INDICATOR 8 (GLOVE) ×1
GLOVE ECLIPSE 7.5 STRL STRAW (GLOVE) ×2 IMPLANT
GLOVE SURG SS PI 6.5 STRL IVOR (GLOVE) ×2 IMPLANT
GOWN PREVENTION PLUS XLARGE (GOWN DISPOSABLE) ×2 IMPLANT
GOWN STRL NON-REIN LRG LVL3 (GOWN DISPOSABLE) ×8 IMPLANT
KIT BASIN OR (CUSTOM PROCEDURE TRAY) ×2 IMPLANT
KIT ROOM TURNOVER OR (KITS) ×2 IMPLANT
MARKER SKIN DUAL TIP RULER LAB (MISCELLANEOUS) ×2 IMPLANT
MESH VENTRALIGHT ST 6IN CRC (Mesh General) ×2 IMPLANT
NEEDLE SPNL 22GX3.5 QUINCKE BK (NEEDLE) ×2 IMPLANT
NS IRRIG 1000ML POUR BTL (IV SOLUTION) ×2 IMPLANT
PAD ARMBOARD 7.5X6 YLW CONV (MISCELLANEOUS) ×4 IMPLANT
SCISSORS LAP 5X35 DISP (ENDOMECHANICALS) IMPLANT
SET IRRIG TUBING LAPAROSCOPIC (IRRIGATION / IRRIGATOR) ×2 IMPLANT
SLEEVE ENDOPATH XCEL 5M (ENDOMECHANICALS) ×2 IMPLANT
SUT MNCRL AB 4-0 PS2 18 (SUTURE) ×2 IMPLANT
SUT NOVA NAB DX-16 0-1 5-0 T12 (SUTURE) ×4 IMPLANT
TOWEL OR 17X24 6PK STRL BLUE (TOWEL DISPOSABLE) IMPLANT
TOWEL OR 17X26 10 PK STRL BLUE (TOWEL DISPOSABLE) ×2 IMPLANT
TRAY FOLEY CATH 14FR (SET/KITS/TRAYS/PACK) ×2 IMPLANT
TRAY LAPAROSCOPIC (CUSTOM PROCEDURE TRAY) ×2 IMPLANT
TROCAR XCEL BLUNT TIP 100MML (ENDOMECHANICALS) ×2 IMPLANT
TROCAR XCEL NON-BLD 11X100MML (ENDOMECHANICALS) ×2 IMPLANT
TROCAR XCEL NON-BLD 5MMX100MML (ENDOMECHANICALS) ×2 IMPLANT
TUNNELER SHEATH ON-Q 16GX12 DP (PAIN MANAGEMENT) ×2 IMPLANT

## 2012-07-17 NOTE — Anesthesia Procedure Notes (Signed)
Procedure Name: Intubation Date/Time: 07/17/2012 7:53 AM Performed by: Marni Griffon Pre-anesthesia Checklist: Patient identified, Emergency Drugs available, Suction available and Patient being monitored Patient Re-evaluated:Patient Re-evaluated prior to inductionOxygen Delivery Method: Circle system utilized Preoxygenation: Pre-oxygenation with 100% oxygen Intubation Type: IV induction Ventilation: Mask ventilation without difficulty Laryngoscope Size: Mac and 3 Grade View: Grade I Tube type: Oral Tube size: 7.5 mm Number of attempts: 1 Airway Equipment and Method: Stylet Placement Confirmation: ETT inserted through vocal cords under direct vision,  breath sounds checked- equal and bilateral and positive ETCO2 Secured at: 21 (cm at teeth) cm Tube secured with: Tape Dental Injury: Teeth and Oropharynx as per pre-operative assessment

## 2012-07-17 NOTE — Progress Notes (Signed)
Dr Andrey Campanile informed of pt BP,  Asked to inform anesthesia

## 2012-07-17 NOTE — Preoperative (Signed)
Beta Blockers   Reason not to administer Beta Blockers:Not Applicable 

## 2012-07-17 NOTE — Transfer of Care (Signed)
Immediate Anesthesia Transfer of Care Note  Patient: Melissa Ross  Procedure(s) Performed: Procedure(s) (LRB) with comments: LAPAROSCOPIC VENTRAL HERNIA (N/A) INSERTION OF MESH (N/A)  Patient Location: PACU  Anesthesia Type:General  Level of Consciousness: awake  Airway & Oxygen Therapy: Patient Spontanous Breathing and Patient connected to nasal cannula oxygen  Post-op Assessment: Report given to PACU RN, Post -op Vital signs reviewed and stable, Patient moving all extremities and moaning  Post vital signs: Reviewed and stable  Complications: No apparent anesthesia complications

## 2012-07-17 NOTE — Anesthesia Postprocedure Evaluation (Signed)
Anesthesia Post Note  Patient: Melissa Ross  Procedure(s) Performed: Procedure(s) (LRB): LAPAROSCOPIC VENTRAL HERNIA (N/A) INSERTION OF MESH (N/A)  Anesthesia type: general  Patient location: PACU  Post pain: Pain level controlled  Post assessment: Patient's Cardiovascular Status Stable  Last Vitals:  Filed Vitals:   07/17/12 1053  BP: 171/75  Pulse: 64  Temp:   Resp: 26    Post vital signs: Reviewed and stable  Level of consciousness: sedated  Complications: No apparent anesthesia complications

## 2012-07-17 NOTE — Op Note (Signed)
Laparoscopic Ventral Incisional Hernia Repair and Placement of 10 in On-Q pain catheter Procedure Note  Indications: Symptomatic ventral incisional hernia; patient has history of Laparoscopic cholecystectomy a few years ago and noticed a bulge at and above her umbilicus a few months after that procedure. She has stated that the area causes pain and is getting larger and desires repair  Pre-operative Diagnosis: ventral incisional hernia  Post-operative Diagnosis: same  Surgeon: Atilano Ina   Assistants: none  Anesthesia: General endotracheal anesthesia  ASA Class: 3  Procedure Details  The patient was seen in the Holding Room. The risks, benefits, complications, treatment options, and expected outcomes were discussed with the patient. The possibilities of reaction to medication, pulmonary aspiration, perforation of viscus, bleeding, recurrent infection, the need for additional procedures, failure to diagnose a condition, and creating a complication requiring transfusion or operation were discussed with the patient. The patient concurred with the proposed plan, giving informed consent.  The site of surgery properly noted/marked. The patient was taken to the operating room, identified as Melissa Ross and the procedure verified as laparoscopic ventral hernia repair with mesh. A Time Out was held and the above information confirmed.    The patient was placed supine.  After establishing general anesthesia, a Foley catheter was placed.  The abdomen was prepped with Chloraprep and draped in standard fashion.  A 5 mm Optiview was used the cannulate the peritoneal cavity in the left upper quadrant below the costal margin.  Pneumoperitoneum was obtained by insufflating CO2, maintaining a maximum pressure of 15 mmHg.  The 5 mm 30-degree laparoscopic was inserted.  There were some omental adhesions to the anterior abdominal wall in and around the hernia defect.  An 11-mm port was placed in the left  anterior axillary line at the level of the umbilicus.  Gentle traction and endoshears with and without electrocautery were used to dissect the omental adhesions away from the anterior abdominal wall. Some of the falciform ligament was taken down sharply with endoshears. There was some bleeding from the falciform ligament. It was still bleeding despite electrocautery so I placed three 10mm ligamax clips which obtained hemostasis. We cleared the entire abdominal wall and were able to visualize 1 swiss cheese fascial defect at and extending above the umbilicus toward the right. We used a spinal needle to identify the extent of the hernia defects.  This covered an area of about 6 x 5.5 cms.  We selected a 15cm piece of Bard Ventralight ST mesh.  We placed 6 stay sutures of 0 Novofil around the edges of the mesh.  The mesh was then rolled up and inserted through the 11 mm port site.  The mesh was then unrolled.  The stay sutures were then pulled up through small stab incisions using the Endo-close device.  This deployed the mesh widely over the fascial defects.  The stay sutures were then tied down.  The Secure Strap device was then used to tack down the edges of the mesh at 1 cm intervals circumferentially. We placed a few tacks inside the outer ring of tacks.  We inspected for hemostasis.  The mesh was well secured and flush against the abdominal wall. A stab incision was made in the LUQ skin and the On-Q blunt tunnler was placed thru the skin into the preperitoneal space and advanced down the left side toward the right side around the edges of the mesh. The sheath was then left in place and the tunnler was removed. A 10inch  On-Q pain catheter was advanced and the sheath was removed. The catheter was secured to the skin with steri-strips .  Pneumoperitoneum was then released as we removed the remainder of the trocars.  The port sites were closed with 4-0 Monocryl.  All of the incisions and stay suture sites were then  sealed with Dermabond.  An abdominal binder was placed around the patient's abdomen.  The patient was extubated and brought to the recovery room in stable condition.  All sponge, instrument, and needle counts were correct prior to closure and at the conclusion of the case.   Findings: 6 x 5.5 cm swiss cheese defect at & to the right of the umbilicus. Adding a 5 cm circumferential overlap, a 15 cm round Bard Ventralight ST mesh was placed into the peritoneal cavity flush against the abdominal wall  Estimated Blood Loss:  less than 50 mL         Complications:  None; patient tolerated the procedure well.         Disposition: PACU - hemodynamically stable.         Condition: stable  Melissa Ross. Andrey Campanile, MD, FACS General, Bariatric, & Minimally Invasive Surgery [facility], [place]

## 2012-07-17 NOTE — Interval H&P Note (Signed)
History and Physical Interval Note:  07/17/2012 7:22 AM  Melissa Ross  has presented today for surgery, with the diagnosis of VENTRAL INCISIONAL HERNIA  The various methods of treatment have been discussed with the patient and family. After consideration of risks, benefits and other options for treatment, the patient has consented to  Procedure(s) (LRB) with comments: LAPAROSCOPIC VENTRAL HERNIA (N/A) INSERTION OF MESH (N/A) as a surgical intervention .  The patient's history has been reviewed, patient examined, no change in status, stable for surgery.  I have reviewed the patient's chart and labs.  Questions were answered to the patient's satisfaction.    [name]. Andrey Campanile, MD, FACS General, Bariatric, & Minimally Invasive Surgery [facility], [place]   [facility] M

## 2012-07-17 NOTE — Anesthesia Preprocedure Evaluation (Addendum)
Anesthesia Evaluation  Patient identified by MRN, date of birth, ID band Patient awake    Reviewed: Allergy & Precautions, H&P , NPO status , Patient's Chart, lab work & pertinent test results, reviewed documented beta blocker date and time   History of Anesthesia Complications Negative for: history of anesthetic complications  Airway Mallampati: II TM Distance: >3 FB Neck ROM: Full    Dental  (+) Teeth Intact and Dental Advisory Given   Pulmonary asthma ,    Pulmonary exam normal       Cardiovascular hypertension, Pt. on medications     Neuro/Psych negative neurological ROS  negative psych ROS   GI/Hepatic Neg liver ROS, GERD-  Medicated and Controlled,  Endo/Other  negative endocrine ROS  Renal/GU negative Renal ROS     Musculoskeletal   Abdominal   Peds  Hematology   Anesthesia Other Findings   Reproductive/Obstetrics                         Anesthesia Physical Anesthesia Plan  ASA: III  Anesthesia Plan: MAC   Post-op Pain Management:    Induction: Intravenous  Airway Management Planned: Oral ETT  Additional Equipment:   Intra-op Plan:   Post-operative Plan: Extubation in OR  Informed Consent: I have reviewed the patients History and Physical, chart, labs and discussed the procedure including the risks, benefits and alternatives for the proposed anesthesia with the patient or authorized representative who has indicated his/her understanding and acceptance.   Dental advisory given  Plan Discussed with: CRNA, Anesthesiologist and Surgeon  Anesthesia Plan Comments:        Anesthesia Quick Evaluation

## 2012-07-17 NOTE — Progress Notes (Signed)
Spoke with dr. Krista Blue re ms. Kotz BP, ok to take to the room with a BP of 171/75

## 2012-07-17 NOTE — H&P (View-Only) (Signed)
Patient ID: Melissa Ross, female   DOB: 05/11/1931, 79 y.o.   MRN: [id number]  Chief Complaint  Patient presents with  . Hernia    HPI Melissa Ross is a 79 y.o. female.   HPI 79-year-old Caucasian female comes in complaining of abdominal hernia. She states that shortly after her gallbladder surgery in 2010 she noticed a small bulge underneath her supraumbilical incision. She states over the past several years the bulge has gotten significantly larger. It tends to cause her more pain and discomfort first thing in the morning when she gets up. She states that it looks like her stomach is about to fall out of her abdomen. She states that it also causes discomfort when she coughs. She has chronic constipation-having one bowel movement per week. She also has significant reflux. She recently had her esophagus dilated at the end of October. Since having her esophagus dilated she hasn't had as much vomiting. She denies any weight loss. She denies any melena or hematochezia. She has had a prior tubal ligation and appendectomy. Her gallbladder surgery was performed at [facility]. Past Medical History  Diagnosis Date  . Hypertension   . GERD (gastroesophageal reflux disease)   . Arthritis   . Ventral hernia   . Asthma     Past Surgical History  Procedure Date  . Cholecystectomy   . Tubal ligation   . Toe surgery     right foot  . Tonsillectomy   . Appendectomy   . Eye surgery     left KPE  . Cataract extraction w/phaco 11/09/2011    Procedure: CATARACT EXTRACTION PHACO AND INTRAOCULAR LENS PLACEMENT (IOC);  Surgeon: Mark T. Shapiro, MD;  Location: [facility];  Service: Ophthalmology;  Laterality: Right;  CDE 10.01  . Esophagogastroduodenoscopy (egd) with esophageal dilation 05/11/2012    Procedure: ESOPHAGOGASTRODUODENOSCOPY (EGD) WITH ESOPHAGEAL DILATION;  Surgeon: Najeeb U Rehman, MD;  Location: [facility] SUITE;  Service: Endoscopy;  Laterality: N/A;    Family History  Problem  Relation Age of Onset  . Malignant hyperthermia Neg Hx   . Pseudochol deficiency Neg Hx   . Hypotension Neg Hx   . Anesthesia problems Neg Hx   . Breast cancer    . Ovarian cancer    . Colon cancer      Social History History  Substance Use Topics  . Smoking status: Never Smoker   . Smokeless tobacco: Not on file  . Alcohol Use: No    Allergies  Allergen Reactions  . Neosporin (Neomycin-Bacitracin Zn-Polymyx)   . Penicillins     Current Outpatient Prescriptions  Medication Sig Dispense Refill  . cholecalciferol (VITAMIN D) 1000 UNITS tablet Take 1,000 Units by mouth daily.      . esomeprazole (NEXIUM) 40 MG capsule Take 40 mg by mouth daily before breakfast.      . losartan (COZAAR) 25 MG tablet Take 25 mg by mouth daily.      . vitamin B-12 (CYANOCOBALAMIN) 1000 MCG tablet Take 1,000 mcg by mouth daily.      . vitamin C (ASCORBIC ACID) 500 MG tablet Take 500 mg by mouth daily.        Review of Systems Review of Systems  Constitutional: Negative for fever, chills, activity change, appetite change and unexpected weight change.  HENT: Positive for hearing loss. Negative for facial swelling and neck pain.   Eyes: Negative for photophobia and visual disturbance.  Respiratory: Negative for chest tightness and shortness of breath.   Cardiovascular:   Positive for leg swelling. Negative for chest pain and palpitations.       Denies CP, SOB, orthopnea, PND. Sleeps on 4 pillows b/c of reflux  Gastrointestinal: Positive for vomiting and constipation. Negative for nausea and diarrhea.       See hpi  Genitourinary: Negative for dysuria and difficulty urinating.  Musculoskeletal:       OA pains  Neurological: Negative for dizziness, tremors, seizures, weakness, light-headedness and numbness.       No TIA, amaurosis fugax  Hematological: Does not bruise/bleed easily.  Psychiatric/Behavioral: Negative for confusion.    Blood pressure 120/72, pulse 84, resp. rate 16, height 5' 8"  (1.727 m), weight 200 lb (90.719 kg).  Physical Exam Physical Exam  Vitals reviewed. Constitutional: She is oriented to person, place, and time. She appears well-developed and well-nourished. No distress.       obese  HENT:  Head: Normocephalic and atraumatic.  Right Ear: External ear normal.  Left Ear: External ear normal.  Eyes: Conjunctivae normal are normal. No scleral icterus.  Neck: Normal range of motion. Neck supple. No tracheal deviation present. No thyromegaly present.  Cardiovascular: Normal rate, regular rhythm, normal heart sounds and intact distal pulses.   Pulmonary/Chest: Effort normal and breath sounds normal. No respiratory distress. She has no wheezes. She has no rales.  Abdominal: Soft. Bowel sounds are normal. She exhibits no distension. There is no rebound and no guarding.         Examined supine and standing - small supraumbilical incision. Bulge above umbilicus, more pronounced standing; reducible. Difficult to palpate fasical borders. Some component of diastasis in upper midline. Mild TTP over hernia  Musculoskeletal: She exhibits no edema and no tenderness.  Neurological: She is alert and oriented to person, place, and time. She exhibits normal muscle tone.  Skin: Skin is warm and dry. No rash noted. She is not diaphoretic. No erythema.  Psychiatric: She has a normal mood and affect. Her behavior is normal. Judgment and thought content normal.    Data Reviewed EGD note - esophageal stricture - dilated  Assessment    Incisional ventral hernia Diastasis of upper midline    Plan    We discussed the etiology of ventral hernias. We discussed the signs and symptoms of incarceration and strangulation. The patient was given educational material. I also drew diagrams. She also has some evidence of a diastases of the hernia which makes it a little bit challenging on physical exam to delineate the fascial defect.  We discussed nonoperative and operative  management. With respect to operative management, we discussed both open repair and laparoscopic repair. We discussed the pros and cons of each approach. I discussed the typical aftercare with each procedure and how each procedure differs.  The patient has elected to first have a CT scan of her abdomen pelvis to delineate her abdominal wall anatomy. She is leaning toward a laparoscopic repair.  We discussed the risk and benefits of surgery including but not limited to bleeding, infection, injury to surrounding structures, hernia recurrence, mesh complications, hematoma/seroma formation, need to convert to an open procedure, blood clot formation, urinary retention, post operative ileus, general anesthesia risk, long-term abdominal pain. We discussed that this procedure can be quite uncomfortable and difficult to recover from based on how the mesh is secured to the abdominal wall. We discussed the importance of avoiding heavy lifting and straining for a period of 6 weeks.  She will be scheduled for a CT scan next week and I   will call her with the results and we will go from there. She was accompanied by her son today who is in agreement with the plan  Blaine Hari M. Modena Bellemare, MD, FACS General, Bariatric, & Minimally Invasive Surgery [facility]        Neviah Braud M 06/22/2012, 2:09 PM    

## 2012-07-18 ENCOUNTER — Encounter (HOSPITAL_COMMUNITY): Payer: Self-pay | Admitting: General Surgery

## 2012-07-18 DIAGNOSIS — I10 Essential (primary) hypertension: Secondary | ICD-10-CM | POA: Diagnosis present

## 2012-07-18 LAB — CBC
Hemoglobin: 10.7 g/dL — ABNORMAL LOW (ref 12.0–15.0)
MCH: 30.1 pg (ref 26.0–34.0)
Platelets: 222 10*3/uL (ref 150–400)
RBC: 3.55 MIL/uL — ABNORMAL LOW (ref 3.87–5.11)

## 2012-07-18 LAB — BASIC METABOLIC PANEL
Calcium: 8.4 mg/dL (ref 8.4–10.5)
GFR calc Af Amer: 68 mL/min — ABNORMAL LOW (ref >90)
GFR calc non Af Amer: 58 mL/min — ABNORMAL LOW (ref >90)
Glucose, Bld: 139 mg/dL — ABNORMAL HIGH (ref 70–99)
Potassium: 4.1 mEq/L (ref 3.5–5.1)
Sodium: 139 mEq/L (ref 135–145)

## 2012-07-18 MED ORDER — DOCUSATE SODIUM 100 MG PO CAPS
100.0000 mg | ORAL_CAPSULE | Freq: Two times a day (BID) | ORAL | Status: DC
  Administered 2012-07-18 – 2012-07-20 (×5): 100 mg via ORAL
  Filled 2012-07-18 (×4): qty 1

## 2012-07-18 MED ORDER — ACETAMINOPHEN 10 MG/ML IV SOLN
1000.0000 mg | Freq: Four times a day (QID) | INTRAVENOUS | Status: AC
  Administered 2012-07-18 (×3): 1000 mg via INTRAVENOUS
  Filled 2012-07-18 (×4): qty 100

## 2012-07-18 MED ORDER — OXYCODONE HCL 5 MG PO TABS
5.0000 mg | ORAL_TABLET | ORAL | Status: DC | PRN
  Administered 2012-07-19: 10 mg via ORAL
  Filled 2012-07-18 (×3): qty 2

## 2012-07-18 NOTE — Evaluation (Signed)
Physical Therapy Evaluation Patient Details Name: Melissa Ross MRN: [id number] DOB: January 28, 1931 Today's Date: 07/18/2012 Time: 0865-7846 PT Time Calculation (min): 24 min  PT Assessment / Plan / Recommendation Clinical Impression  Pt is a 79 yo female s/p laproscopic hernia repair presenting with increased abd pain requiring patient to need assist for all transfers, adl's and mobility. Patient may need SNF placement upon d/c due to lack of assist available at home. Will con't to work with patient in hospital to improved transfer and ambulation abilities.    PT Assessment  Patient needs continued PT services    Follow Up Recommendations  SNF;Supervision/Assistance - 24 hour (can go home if have dependable 24/7 assist)    Does the patient have the potential to tolerate intense rehabilitation      Barriers to Discharge Decreased caregiver support      Equipment Recommendations  Rolling walker with 5" wheels (3n1 commode)    Recommendations for Other Services     Frequency Min 3X/week    Precautions / Restrictions Precautions Precautions: Fall Restrictions Weight Bearing Restrictions: No   Pertinent Vitals/Pain 8/10 abdominal pain      Mobility  Bed Mobility Bed Mobility: Rolling Right;Right Sidelying to Sit Rolling Right: 3: Mod assist;With rail Right Sidelying to Sit: 3: Mod assist;With rails;HOB elevated Details for Bed Mobility Assistance: max directional v/c's, assist for LE management and trunk elevation Transfers Transfers: Sit to Stand;Stand to Sit;Stand Pivot Transfers Sit to Stand: 1: +2 Total assist;With upper extremity assist;From bed Sit to Stand: Patient Percentage: 60% Stand to Sit: 4: Min assist;With upper extremity assist;With armrests;To chair/3-in-1 Stand Pivot Transfers: 1: +2 Total assist Stand Pivot Transfers: Patient Percentage: 60% Details for Transfer Assistance: bilat under arm assist used due to unsuccessful attempt earlier by RN only. pt  with short shuffled steps to chair Ambulation/Gait Ambulation/Gait Assistance: Not tested (comment) (only able to tolerate std pvt to chair)    Shoulder Instructions     Exercises     PT Diagnosis: Difficulty walking  PT Problem List: Decreased strength;Decreased activity tolerance;Decreased balance;Decreased mobility PT Treatment Interventions: Gait training;Stair training;Functional mobility training;Therapeutic activities;Therapeutic exercise   PT Goals Acute Rehab PT Goals PT Goal Formulation: With patient Time For Goal Achievement: 07/25/12 Potential to Achieve Goals: Good Pt will Roll Supine to Right Side: with supervision PT Goal: Rolling Supine to Right Side - Progress: Goal set today Pt will go Supine/Side to Sit: with supervision;with HOB 0 degrees PT Goal: Supine/Side to Sit - Progress: Goal set today Pt will go Sit to Supine/Side: with supervision;with HOB 0 degrees PT Goal: Sit to Supine/Side - Progress: Goal set today Pt will go Sit to Stand: with supervision;with upper extremity assist (up to RW) PT Goal: Sit to Stand - Progress: Goal set today Pt will Transfer Bed to Chair/Chair to Bed: with supervision (with RW) PT Transfer Goal: Bed to Chair/Chair to Bed - Progress: Goal set today Pt will Ambulate: 51 - 150 feet;with supervision;with rolling walker PT Goal: Ambulate - Progress: Goal set today Pt will Go Up / Down Stairs: 1-2 stairs;with min assist PT Goal: Up/Down Stairs - Progress: Goal set today  Visit Information  Last PT Received On: 07/18/12 Assistance Needed: +1    Subjective Data  Subjective: Pt received supine in bed with report "I'm really hurting right now." Rn provided pain meds to patient.   Prior Functioning  Home Living Lives With: Spouse Available Help at Discharge:  (pt reports spouse unable to assist due to alcoholic)  Type of Home: House Home Access: Stairs to enter [employer] of Steps: 1 Entrance Stairs-Rails: None Home  Layout: Two level;Able to live on main level with bedroom/bathroom Alternate Level Stairs-Number of Steps: 12 Bathroom Shower/Tub: Health visitor: Standard Bathroom Accessibility: Yes How Accessible: Accessible via walker Home Adaptive Equipment: Walker - standard Additional Comments: pt reports "I don't want my husband doing nothing for me." Prior Function Level of Independence: Independent Able to Take Stairs?: Yes Driving: Yes Vocation: Part time employment 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[employer] Dominant Hand: Right    Cognition  Overall Cognitive Status: Appears within functional limits for tasks assessed/performed Arousal/Alertness: Awake/alert Orientation Level: Oriented X4 / Intact Behavior During Session: [facility] for tasks performed    Extremity/Trunk Assessment Right Upper Extremity Assessment RUE ROM/Strength/Tone: Within functional levels;WFL for tasks assessed Left Upper Extremity Assessment LUE ROM/Strength/Tone: WFL for tasks assessed Right Lower Extremity Assessment RLE ROM/Strength/Tone: [facility] for tasks assessed Left Lower Extremity Assessment LLE ROM/Strength/Tone: [facility] for tasks assessed Trunk Assessment Trunk Assessment:  (increased trunk flex due to abd pain/soarness)   Balance    End of Session PT - End of Session Equipment Utilized During Treatment: Gait belt Activity Tolerance: Patient limited by pain Patient left: in chair;with call bell/phone within reach Nurse Communication: Mobility status  GP     Marcene Brawn 07/18/2012, 12:30 PM  Lewis Shock, PT, DPT Pager #: [phone] Office #: [phone]

## 2012-07-18 NOTE — Progress Notes (Signed)
1 Day Post-Op  Subjective: Doing ok. Didn't have postop shoulder pain which was her biggest concern after laparoscopic repair; no n/v. Tolerated liquids. Hasn't been out of bed yet. Pain ok but very sore  Objective: Vital signs in last 24 hours: Temp:  [97 F (36.1 C)-98.2 F (36.8 C)] 97.6 F (36.4 C) (01/07 0530) Pulse Rate:  [59-78] 73  (01/07 0530) Resp:  [11-26] 18  (01/07 0530) BP: (133-214)/(42-79) 135/45 mmHg (01/07 0530) SpO2:  [92 %-100 %] 97 % (01/07 0530) Weight:  [190 lb (86.183 kg)] 190 lb (86.183 kg) (01/06 1115)    Intake/Output from previous day: 01/06 0701 - 01/07 0700 In: 3473 [P.O.:360; I.V.:2913; IV Piggyback:200] Out: 3450 [Urine:3150; Blood:300] Intake/Output this shift:    Alert, nad cta ant with decreased BS at bases Reg Soft, obese, incisions c/d/i; +binder. +BS. Mild expected TTP No edema  Lab Results:  No results found for this basename: WBC:2,HGB:2,HCT:2,PLT:2 in the last 72 hours BMET No results found for this basename: NA:2,K:2,CL:2,CO2:2,GLUCOSE:2,BUN:2,CREATININE:2,CALCIUM:2 in the last 72 hours PT/INR No results found for this basename: LABPROT:2,INR:2 in the last 72 hours ABG No results found for this basename: PHART:2,PCO2:2,PO2:2,HCO3:2 in the last 72 hours  Studies/Results: No results found.  Anti-infectives: Anti-infectives     Start     Dose/Rate Route Frequency Ordered Stop   07/17/12 0600   vancomycin (VANCOCIN) IVPB 1000 mg/200 mL premix        1,000 mg 200 mL/hr over 60 Minutes Intravenous On call to O.R. 07/16/12 1356 07/17/12 0755          Assessment/Plan: s/p Procedure(s) (LRB) with comments: LAPAROSCOPIC VENTRAL HERNIA (N/A) INSERTION OF MESH (N/A) Adv diet  OOB, ambulate D/c foley PT/OT eval Add po pain meds + IV Tylenol  Not medically ready for discharge today. Hopefully on Wednesday depending on pain control  [name]. Andrey Campanile, MD, FACS General, Bariatric, & Minimally Invasive Surgery [facility], [place]   LOS: 1 day    Melissa Ross 07/18/2012

## 2012-07-19 MED ORDER — KETOROLAC TROMETHAMINE 15 MG/ML IJ SOLN
15.0000 mg | Freq: Four times a day (QID) | INTRAMUSCULAR | Status: DC
  Administered 2012-07-19 – 2012-07-20 (×4): 15 mg via INTRAVENOUS
  Filled 2012-07-19 (×7): qty 1

## 2012-07-19 MED ORDER — HYDRALAZINE HCL 20 MG/ML IJ SOLN
10.0000 mg | INTRAMUSCULAR | Status: DC | PRN
  Filled 2012-07-19: qty 0.5

## 2012-07-19 NOTE — Clinical Social Work Psychosocial (Addendum)
    Clinical Social Work Department BRIEF PSYCHOSOCIAL ASSESSMENT 07/19/2012  Patient:  Melissa Ross, Melissa Ross     Account Number:  [id number]     Admit date:  07/17/2012  Clinical Social Worker:  Hulan Fray  Date/Time:  07/19/2012 11:21 AM  Referred by:  Physician  Date Referred:  07/19/2012 Referred for  SNF Placement   Other Referral:   Interview type:  Patient Other interview type:    PSYCHOSOCIAL DATA Living Status:  ALONE Admitted from facility:   Level of care:   Primary support name:  Rudy Jew Primary support relationship to patient:  CHILD, ADULT Degree of support available:   supportive    CURRENT CONCERNS Current Concerns  Post-Acute Placement   Other Concerns:    SOCIAL WORK ASSESSMENT / PLAN Clinical Social Worker received referral for SNF placement at discharge. CSW introduced self and explained reason for visit. Patient reported that she is agreeable to SNF at discharge and is preferring [facility]. CSW encouraged patient to have alternative SNF's if her preferred facility does not have availability. Patient reported that she will discuss with her family about alternative facilities. Patient reported that she "has nobody at home" and her children work. CSW will complete FL2 for MD's signature and will update patient when bed offers are made.   Assessment/plan status:  Psychosocial Support/Ongoing Assessment of Needs Other assessment/ plan:   07-19-12 CSW initiated [employer] authorization for SNF placement. Information/referral to community resources:   SNF packet    PATIENT'S/FAMILY'S RESPONSE TO PLAN OF CARE: Patient is agreeable for SNF placement at discharge, with preference for [facility].

## 2012-07-19 NOTE — Progress Notes (Addendum)
2 Days Post-Op  Subjective: No n/v. Very sore when got up. Limited walking secondary to pain. It went "awful"  Objective: Vital signs in last 24 hours: Temp:  [97.9 F (36.6 C)-98.8 F (37.1 C)] 98.1 F (36.7 C) (01/08 0528) Pulse Rate:  [78-94] 94  (01/08 0528) Resp:  [18-20] 18  (01/08 0528) BP: (153-183)/(48-65) 155/64 mmHg (01/08 0528) SpO2:  [95 %-98 %] 95 % (01/08 0528)    Intake/Output from previous day: 01/07 0701 - 01/08 0700 In: 1060.8 [P.O.:480; I.V.:580.8] Out: 2100 [Urine:2100] Intake/Output this shift:    Alert, nad cta Reg Obese, soft, expected mild TTP. HypoBS. Incisions c/d/i  Lab Results:   Basename 07/18/12 0740  WBC 5.5  HGB 10.7*  HCT 33.2*  PLT 222   BMET  Basename 07/18/12 0950  NA 139  K 4.1  CL 105  CO2 25  GLUCOSE 139*  BUN 8  CREATININE 0.91  CALCIUM 8.4   PT/INR No results found for this basename: LABPROT:2,INR:2 in the last 72 hours ABG No results found for this basename: PHART:2,PCO2:2,PO2:2,HCO3:2 in the last 72 hours  Studies/Results: No results found.  Anti-infectives: Anti-infectives     Start     Dose/Rate Route Frequency Ordered Stop   07/17/12 0600   vancomycin (VANCOCIN) IVPB 1000 mg/200 mL premix        1,000 mg 200 mL/hr over 60 Minutes Intravenous On call to O.R. 07/16/12 1356 07/17/12 0755          Assessment/Plan: s/p Procedure(s) (LRB) with comments: LAPAROSCOPIC VENTRAL HERNIA (N/A) INSERTION OF MESH (N/A) Regular diet PT/OT OOB, ambulate Will add toradol for pain - monitor cbc SW consult for possible SNF placement. Will see how does with PT today. Pt states she lives at home with alcoholic husband and has limited assist  [name]. Andrey Campanile, MD, FACS General, Bariatric, & Minimally Invasive Surgery [facility], [place]   LOS: 2 days    Atilano Ina 07/19/2012

## 2012-07-19 NOTE — Progress Notes (Signed)
Physical Therapy Treatment Patient Details Name: Melissa Ross MRN: [id number] DOB: [birth year] Today's Date: 07/19/2012 Time: 1191-4782 PT Time Calculation (min): 19 min  PT Assessment / Plan / Recommendation Comments on Treatment Session  Patient able to ambulate in hallway today.  Still very limited tolerance due to nausea, belching and c/o "choking" after eating something with "garlic" at lunch.  Still likely to need increased support at discharge and will benefit from SNF level rehab.    Follow Up Recommendations  SNF                 Equipment Recommendations  Rolling walker with 5" wheels        Frequency Min 3X/week   Plan Discharge plan remains appropriate    Precautions / Restrictions Precautions Precautions: Fall   Pertinent Vitals/Pain Min c/o abdominal pain; mostly c/o nausea and choking    Mobility  Bed Mobility Details for Bed Mobility Assistance: Patient up in bathroom upon arrival Transfers Sit to Stand: 3: Mod assist;From toilet;With upper extremity assist (using grabbar on left) Stand to Sit: 4: Min assist;With armrests;To chair/3-in-1 Ambulation/Gait Ambulation/Gait Assistance: 4: Min guard Ambulation Distance (Feet): 85 Feet Assistive device: Rolling walker Ambulation/Gait Assistance Details: forward flexed, increased proximity to walker Gait Pattern: Decreased stride length      PT Goals Acute Rehab PT Goals Pt will go Sit to Stand: with supervision PT Goal: Sit to Stand - Progress: Progressing toward goal Pt will Transfer Bed to Chair/Chair to Bed: with supervision PT Transfer Goal: Bed to Chair/Chair to Bed - Progress: Progressing toward goal Pt will Ambulate: 51 - 150 feet;with supervision;with rolling walker PT Goal: Ambulate - Progress: Progressing toward goal  Visit Information  Last PT Received On: 07/19/12    Subjective Data  Subjective: Been feeling like I am choking since eating dinner.  (RN aware and plans to deliver meds)     Cognition  Overall Cognitive Status: Appears within functional limits for tasks assessed/performed Arousal/Alertness: Awake/alert Behavior During Session: [facility] for tasks performed    Balance  Static Standing Balance Static Standing - Balance Support: During functional activity;No upper extremity supported (washing hands) Static Standing - Level of Assistance: 5: Stand by assistance  End of Session PT - End of Session Activity Tolerance: Patient tolerated treatment well Patient left: in chair;with call bell/phone within reach Nurse Communication: Patient requests pain meds   GP     [facility] 07/19/2012, 3:48 PM Sheran Lawless, PT [phone] 07/19/2012

## 2012-07-19 NOTE — Clinical Social Work Placement (Addendum)
    Clinical Social Work Department CLINICAL SOCIAL WORK PLACEMENT NOTE 07/19/2012  Patient:  Melissa Ross, Melissa Ross  Account Number:  [id number] Admit date:  07/17/2012  Clinical Social Worker:  Hulan Fray  Date/time:  07/19/2012 11:54 AM  Clinical Social Work is seeking post-discharge placement for this patient at the following level of care:   SKILLED NURSING   (*CSW will update this form in Epic as items are completed)   07/19/2012  Patient/family provided with [facility] Department of Clinical Social Work's list of facilities offering this level of care within the geographic area requested by the patient (or if unable, by the patient's family).  07/19/2012  Patient/family informed of their freedom to choose among providers that offer the needed level of care, that participate in Medicare, Medicaid or managed care program needed by the patient, have an available bed and are willing to accept the patient.  07/19/2012  Patient/family informed of [facility]' ownership interest in [facility], as well as of the fact that they are under no obligation to receive care at this facility.  PASARR submitted to EDS on 07/19/2012 PASARR number received from EDS on 07/19/2012  FL2 transmitted to all facilities in geographic area requested by pt/family on  07/19/2012 FL2 transmitted to all facilities within larger geographic area on   Patient informed that his/her managed care company has contracts with or will negotiate with  certain facilities, including the following:     Patient/family informed of bed offers received:  07/20/12 Patient chooses bed at [facility] Physician recommends and patient chooses bed at    Patient to be transferred to [facility] on 07/20/12   Patient to be transferred to facility by [facility] private vehicle.  The following physician request were entered in Epic:   Additional Comments:

## 2012-07-19 NOTE — Progress Notes (Signed)
Occupational Therapy Evaluation Patient Details Name: Melissa Ross MRN: [id number] DOB: 1931-04-13 Today's Date: 07/19/2012 Time: 4401-0272 OT Time Calculation (min): 32 min  OT Assessment / Plan / Recommendation Clinical Impression  79yo s/p hernia repair. PTA, pt lived independently with husband. Pt states that husband will not be able to help her. Pt will need rehab at SNF prior to D/C home. Pt will benefit from skilled OT services to max independence with ADL and functional mobility for ADL to facilitate D/C to SNF. Pt in agreement    OT Assessment  Patient needs continued OT Services    Follow Up Recommendations  SNF    Barriers to Discharge Decreased caregiver support    Equipment Recommendations  None recommended by OT    Recommendations for Other Services  none  Frequency  Min 2X/week    Precautions / Restrictions Restrictions Weight Bearing Restrictions: No   Pertinent Vitals/Pain Requested pain meds at beginning of session. Meds given. At end of session pain score of 1    ADL  Eating/Feeding: Independent Where Assessed - Eating/Feeding: Chair Grooming: Supervision/safety;Set up Where Assessed - Grooming: Supported standing Upper Body Bathing: Set up;Supervision/safety Where Assessed - Upper Body Bathing: Supported sitting Lower Body Bathing: Moderate assistance Where Assessed - Lower Body Bathing: Supported sit to stand Upper Body Dressing: Minimal assistance Where Assessed - Upper Body Dressing: Supported sitting Lower Body Dressing: Maximal assistance Where Assessed - Lower Body Dressing: Supported sit to Pharmacist, hospital: Minimal Dentist Method: Sit to Barista: Comfort height toilet Toileting - Clothing Manipulation and Hygiene: Supervision/safety Where Assessed - Engineer, mining and Hygiene: Sit to stand from 3-in-1 or toilet Equipment Used: Gait belt;Rolling walker Transfers/Ambulation  Related to ADLs: Min A ADL Comments: Limited primiarily by pain and weakness    OT Diagnosis: Generalized weakness;Acute pain  OT Problem List: Decreased strength;Decreased activity tolerance;Decreased knowledge of use of DME or AE;Obesity;Pain OT Treatment Interventions: Self-care/ADL training;Therapeutic exercise;Energy conservation;DME and/or AE instruction;Therapeutic activities;Patient/family education   OT Goals Acute Rehab OT Goals OT Goal Formulation: With patient Time For Goal Achievement: 08/02/12 Potential to Achieve Goals: Good ADL Goals Pt Will Perform Lower Body Bathing: with min assist;Sit to stand from chair;Unsupported;with adaptive equipment;with cueing (comment type and amount) ADL Goal: Lower Body Bathing - Progress: Goal set today Pt Will Perform Lower Body Dressing: with min assist;Sit to stand from chair;Unsupported;with adaptive equipment;with cueing (comment type and amount) ADL Goal: Lower Body Dressing - Progress: Goal set today Pt Will Transfer to Toilet: with supervision;Ambulation;with DME ADL Goal: Toilet Transfer - Progress: Goal set today Additional ADL Goal #1: Pt will state 2 E conservation techniques to use during ADL. ADL Goal: Additional Goal #1 - Progress: Goal set today  Visit Information  Last OT Received On: 07/19/12    Subjective Data  Subjective: I live by myself   Prior Functioning     Home Living Lives With: Spouse Available Help at Discharge: Available PRN/intermittently (son - works full time) Type of Home: House Home Access: Stairs to enter Secretary/administrator of Steps: 1 Entrance Stairs-Rails: None Home Layout: Two level;Able to live on main level with bedroom/bathroom Alternate Level Stairs-Number of Steps: 12 Bathroom Shower/Tub: Health visitor: Standard Bathroom Accessibility: Yes How Accessible: Accessible via walker Home Adaptive Equipment: Walker - standard Additional Comments: husband is not  helpful Prior Function Level of Independence: Independent Able to Take Stairs?: Yes Driving: Yes 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 Communication Communication: No  difficulties Dominant Hand: Right         Vision/Perception  WNL   Cognition  Overall Cognitive Status: Appears within functional limits for tasks assessed/performed Arousal/Alertness: Awake/alert Orientation Level: Oriented X4 / Intact Behavior During Session: [facility] for tasks performed    Extremity/Trunk Assessment Right Upper Extremity Assessment RUE ROM/Strength/Tone: [facility] for tasks assessed Left Upper Extremity Assessment LUE ROM/Strength/Tone: WFL for tasks assessed Right Lower Extremity Assessment RLE ROM/Strength/Tone: [facility] for tasks assessed Left Lower Extremity Assessment LLE ROM/Strength/Tone: [facility] for tasks assessed Trunk Assessment Trunk Assessment: Other exceptions (kyphotic due to comfort position)     Mobility Bed Mobility Bed Mobility: Rolling Right;Right Sidelying to Sit;Sitting - Scoot to Edge of Bed Rolling Right: 5: Supervision;With rail Right Sidelying to Sit: 4: Min assist;With rails;HOB elevated Sitting - Scoot to Edge of Bed: 5: Supervision Details for Bed Mobility Assistance: vc for technique. reassurance extremely helpful Transfers Transfers: Sit to Stand;Stand to Sit Sit to Stand: 4: Min assist;With upper extremity assist;From bed Stand to Sit: 4: Min assist;With upper extremity assist;To chair/3-in-1;To toilet Details for Transfer Assistance: vc for safety and less painful technique               Balance Balance Balance Assessed: Yes Dynamic Sitting Balance Dynamic Sitting - Balance Support: No upper extremity supported;Feet supported Dynamic Sitting - Level of Assistance: 6: Modified independent (Device/Increase time) Static Standing Balance Static Standing - Balance Support: Bilateral upper extremity supported;During functional activity Static Standing  - Level of Assistance: 5: Stand by assistance   End of Session OT - End of Session Equipment Utilized During Treatment: Gait belt Activity Tolerance: Patient tolerated treatment well Patient left: in chair;with call bell/phone within reach Nurse Communication: Mobility status;Other (comment) (need for SNF)  GO     Lamoyne Palencia,HILLARY 07/19/2012, 10:25 AM [facility], OTR/L  [phone] 07/19/2012

## 2012-07-20 LAB — CBC
Hemoglobin: 11.5 g/dL — ABNORMAL LOW (ref 12.0–15.0)
MCH: 30.2 pg (ref 26.0–34.0)
Platelets: 271 10*3/uL (ref 150–400)
RBC: 3.81 MIL/uL — ABNORMAL LOW (ref 3.87–5.11)
WBC: 6.5 10*3/uL (ref 4.0–10.5)

## 2012-07-20 MED ORDER — PANTOPRAZOLE SODIUM 40 MG PO TBEC: 40.0000 mg | DELAYED_RELEASE_TABLET | Freq: Every day | ORAL | Status: DC

## 2012-07-20 MED ORDER — POLYETHYLENE GLYCOL 3350 17 G PO PACK
17.0000 g | PACK | Freq: Every day | ORAL | Status: DC
  Filled 2012-07-20: qty 1

## 2012-07-20 MED ORDER — OXYCODONE-ACETAMINOPHEN 5-325 MG PO TABS: 1.0000 | ORAL_TABLET | ORAL | Status: DC | PRN

## 2012-07-20 MED ORDER — MAGNESIUM HYDROXIDE 400 MG/5ML PO SUSP
30.0000 mL | Freq: Every day | ORAL | Status: DC | PRN
  Administered 2012-07-20: 30 mL via ORAL
  Filled 2012-07-20: qty 30

## 2012-07-20 MED ORDER — OXYCODONE-ACETAMINOPHEN 5-325 MG PO TABS
1.0000 | ORAL_TABLET | ORAL | Status: DC | PRN
  Administered 2012-07-20: 2 via ORAL
  Filled 2012-07-20: qty 2

## 2012-07-20 NOTE — Progress Notes (Signed)
3 Days Post-Op  Subjective: No n/v. No BM. Pt has chronic constipation. Yesterday was a better day than the day before. Spent more time in the chair  Objective: Vital signs in last 24 hours: Temp:  [98.2 F (36.8 C)-98.8 F (37.1 C)] 98.2 F (36.8 C) (01/09 0500) Pulse Rate:  [70-87] 70  (01/09 0500) Resp:  [18-20] 20  (01/09 0500) BP: (149-201)/(58-100) 158/58 mmHg (01/09 0500) SpO2:  [98 %-100 %] 100 % (01/09 0500) Last BM Date: 07/16/12  Intake/Output from previous day: 01/08 0701 - 01/09 0700 In: 1640.8 [P.O.:480; I.V.:1160.8] Out: 700 [Urine:700] Intake/Output this shift:    Alert, nad cta  Reg Obese, soft, approp TTP. Incision c/d/i. On-Q pain catheter removed  Lab Results:   Basename 07/20/12 0450 07/18/12 0740  WBC 6.5 5.5  HGB 11.5* 10.7*  HCT 35.7* 33.2*  PLT 271 222   BMET  Basename 07/18/12 0950  NA 139  K 4.1  CL 105  CO2 25  GLUCOSE 139*  BUN 8  CREATININE 0.91  CALCIUM 8.4   PT/INR No results found for this basename: LABPROT:2,INR:2 in the last 72 hours ABG No results found for this basename: PHART:2,PCO2:2,PO2:2,HCO3:2 in the last 72 hours  Studies/Results: No results found.  Anti-infectives: Anti-infectives     Start     Dose/Rate Route Frequency Ordered Stop   07/17/12 0600   vancomycin (VANCOCIN) IVPB 1000 mg/200 mL premix        1,000 mg 200 mL/hr over 60 Minutes Intravenous On call to O.R. 07/16/12 1356 07/17/12 0755          Assessment/Plan: s/p Procedure(s) (LRB) with comments: LAPAROSCOPIC VENTRAL HERNIA (N/A) INSERTION OF MESH (N/A)  Laxative for constipation PT/OT Cont home BP meds hgb stable, cont subcu heparin Cont toradol Switch oxycodone to percocet now that IV tylenol expired Ok for d/c when bed available.  Melissa Ross. Melissa Campanile, MD, FACS General, Bariatric, & Minimally Invasive Surgery [facility], [place]   LOS: 3 days    Melissa Ross 07/20/2012

## 2012-07-20 NOTE — Discharge Summary (Signed)
Physician Discharge Summary  Melissa Ross OZH:[id number] DOB: July 28, 1930 DOA: 07/17/2012  PCP: Ignatius Specking., MD  Admit date: 07/17/2012 Discharge date: 07/20/2012  Recommendations for Outpatient Follow-up:  1. Follow-up with your primary care physician in 4-6 weeks  Follow-up Information    Follow up with Atilano Ina, MD,FACS. On 08/01/2012. (3:00 PM)    Contact information:   [address] [place] [address] [phone]         Discharge Diagnoses:  Patient Active Problem List  Diagnosis  . Incisional hernia without mention of obstruction or gangrene  . HTN (hypertension)   Surgical Procedure: Laparoscopic repair of ventral incisional hernia with mesh, placement of On-Q pain catheter 07/17/12  Discharge Condition: fair Disposition: to SNF  Diet recommendation: Cardiac fit  Filed Weights   07/17/12 1115  Weight: 190 lb (86.183 kg)    Hospital Course:  79 year old Caucasian female admitted for planned laparoscopic repair of ventral incisional hernia. Her postoperative course was unremarkable. She was started on a clear liquid diet on postoperative day 1. Her diet was gradually advanced to solids which she tolerated. She was maintained on perioperative subcutaneous heparin for DVT prophylaxis. Physical therapy and occupational therapy were consulted which recommended skilled nursing facility placement. On day of discharge, the patient's On-Q pain catheter was removed. She was tolerating a regular diet. Her vital signs are stable. Her incisions were clean, dry and intact.  I would recommend continuing the patient on prophylactic DVT prophylaxis with either subcutaneous heparin or Lovenox while in rehabilitation. She may wear the abdominal binder for comfort.   Discharge Instructions      Discharge Orders    Future Appointments: Provider: Department: Dept Phone: Center:   08/01/2012 3:00 PM Atilano Ina, MD,FACS [facility], [place] [phone]  None     Future Orders Please Complete By Expires   Diet - low sodium heart healthy      Increase activity slowly      Discharge instructions      Comments:   See [facility] discharge instructions       Medication List     As of 07/20/2012 10:29 AM    TAKE these medications         cholecalciferol 1000 UNITS tablet   Commonly known as: VITAMIN D   Take 1,000 Units by mouth daily.      esomeprazole 40 MG capsule   Commonly known as: NEXIUM   Take 40 mg by mouth daily before breakfast.      losartan 25 MG tablet   Commonly known as: COZAAR   Take 25 mg by mouth daily.      LYSINE PO   Take 1 tablet by mouth 2 (two) times daily as needed. For fever blister outbreak      oxyCODONE-acetaminophen 5-325 MG per tablet   Commonly known as: PERCOCET/ROXICET   Take 1-2 tablets by mouth every 4 (four) hours as needed.      vitamin B-12 1000 MCG tablet   Commonly known as: CYANOCOBALAMIN   Take 1,000 mcg by mouth daily.      vitamin C 500 MG tablet   Commonly known as: ASCORBIC ACID   Take 500 mg by mouth daily.        Follow-up Information    Follow up with Atilano Ina, MD,FACS. On 08/01/2012. (3:00 PM)    Contact information:   [address] [place] [address] [phone]  The results of significant diagnostics from this hospitalization (including imaging, microbiology, ancillary and laboratory) are listed below for reference.    Significant Diagnostic Studies: Dg Chest 2 View  07/11/2012  *RADIOLOGY REPORT*  Clinical Data: Preop hiatal hernia repair  CHEST - 2 VIEW  Comparison: 06/26/12  Findings: Normal heart size.  No pleural effusion or edema.  No airspace consolidation.  Scarring noted in the left lower lobe. Review of the visualized osseous structures is unremarkable.  IMPRESSION:  1.  Left base scarring. 2.  No acute cardiopulmonary abnormalities.   Original Report Authenticated By: Signa Kell, M.D.    Ct Abdomen Pelvis W  Contrast  06/26/2012  *RADIOLOGY REPORT*  Clinical Data: Abdominal pain, evaluate for possible hernia  CT ABDOMEN AND PELVIS WITH CONTRAST  Technique:  Multidetector CT imaging of the abdomen and pelvis was performed following the standard protocol during bolus administration of intravenous contrast.  Contrast: OMNIPAQUE IOHEXOL 300 MG/ML  SOLN  Comparison: Ultrasound of the abdomen of 12/02/2010  BUN and creatinine were obtained on site at [facility] at [address] Results:  BUN 13 mg/dL,  Creatinine 1.0 mg/dL.  Findings: Linear scarring or atelectasis is noted at the lung bases.  The liver enhances with no focal abnormality and no ductal dilatation is seen.  Due to patient body habitus, a portion of the right abdomen is not within the scan circle.  Surgical clips are present from prior cholecystectomy.  The pancreas is normal in size and the pancreatic duct is not dilated.  The adrenal glands and spleen are unremarkable.  The stomach is moderately fluid distended the kidneys enhance with no calculus or mass and no hydronephrosis is seen.  The abdominal aorta is normal in caliber with moderate atheromatous change present.  No adenopathy is seen.  There is an anterior abdominal wall hernia present just to the right of midline and just above the umbilicus containing fat and a small portion of the mid transverse colon.  The uterus is normal in size and there is a calcified uterine fibroid present to the right of midline.  The urinary bladder is not well distended.  No pelvic mass or fluid is seen. Multiple diverticula are scattered throughout the entire colon.  The terminal ileum is unremarkable. Degenerative disc disease is noted at L5-S1.  Also there is coarse trabeculation of the right hemi pelvis with cortical thickening consistent with Paget's disease.  IMPRESSION:  1.  Ventral hernia in a right periumbilical location containing fat and a nondistended portion of the mid transverse colon.  2.  Scattered colonic diverticula 3.  Paget's disease of the right hemipelvis.   Original Report Authenticated By: Dwyane Dee, M.D.     Microbiology: Recent Results (from the past 240 hour(s))  SURGICAL PCR SCREEN     Status: Normal   Collection Time   07/11/12  3:22 PM      Component Value Range Status Comment   MRSA, PCR NEGATIVE  NEGATIVE Final    Staphylococcus aureus NEGATIVE  NEGATIVE Final      Labs: Basic Metabolic Panel:  Lab 07/18/12 1610  NA 139  K 4.1  CL 105  CO2 25  GLUCOSE 139*  BUN 8  CREATININE 0.91  CALCIUM 8.4  MG --  PHOS --   CBC:  Lab 07/20/12 0450 07/18/12 0740  WBC 6.5 5.5  NEUTROABS -- --  HGB 11.5* 10.7*  HCT 35.7* 33.2*  MCV 93.7 93.5  PLT 271 222  Principal Problem:  *Incisional hernia without mention of obstruction or gangrene Active Problems:  HTN (hypertension)   Time coordinating discharge: 20 minutes  Signed:  Atilano Ina, MD [facility], [place] [phone] 07/20/2012, 10:29 AM

## 2012-07-20 NOTE — Clinical Social Work Note (Addendum)
Clinical Social Worker spoke with patient regarding bed offer received from [facility], patient's preferred facility. Patient is arranging transport with family. CSW will facilitate discharge when medically stable. CSW will continue to follow.   10:13am CSW received insurance authorization for SNF placement.  10:57am CSW facilitated discharge by contacting facility and they are ready to accept patient at any time. Patient reported that her son will be able to transport patient to facility. CSW will sign off, as social work intervention is no longer needed.   Melissa Ross MSW, [employer] [phone]

## 2012-07-20 NOTE — Progress Notes (Signed)
PHARMACIST - PHYSICIAN COMMUNICATION  CONCERNING: Protonix IV to Oral Route Change Policy  RECOMMENDATION: This patient is receiving Protonix by the intravenous route.  Based on criteria approved by the Pharmacy and Therapeutics Committee, this is being converted to the equivalent oral dose form(s).   DESCRIPTION: These criteria include:  The patient is not neutropenic and does not exhibit a GI malabsorption state. Did not have GIB this admit.  The patient is eating (either orally or via tube) and/or has been taking other orally administered medications for a least 24 hours  If you have questions about this conversion, please contact the Pharmacy Department  Shandiin Eisenbeis K. Allena Katz, PharmD, BCPS.  Clinical Pharmacist Pager [phone]. 07/20/2012 9:55 AM

## 2012-07-20 NOTE — Progress Notes (Signed)
DC WITH GRANDDAUGHTER TO [facility]

## 2012-08-01 ENCOUNTER — Ambulatory Visit (INDEPENDENT_AMBULATORY_CARE_PROVIDER_SITE_OTHER): Payer: Medicare Other | Admitting: General Surgery

## 2012-08-01 ENCOUNTER — Encounter (INDEPENDENT_AMBULATORY_CARE_PROVIDER_SITE_OTHER): Payer: Self-pay | Admitting: General Surgery

## 2012-08-01 VITALS — BP 132/84 | HR 78 | Temp 98.4°F | Resp 18 | Ht 69.0 in | Wt 198.0 lb

## 2012-08-01 DIAGNOSIS — Z09 Encounter for follow-up examination after completed treatment for conditions other than malignant neoplasm: Secondary | ICD-10-CM

## 2012-08-01 NOTE — Patient Instructions (Signed)
Remember no to lift, push, or pull anything greater than 15 pounds for another 4 weeks

## 2012-08-01 NOTE — Progress Notes (Signed)
Subjective:     Patient ID: Melissa Ross, female   DOB: 02-03-1931, 79 y.o.   MRN: [id number]  HPI 79 year old Caucasian female comes in today for her first postoperative appointment after undergoing laparoscopic repair of a ventral incisional hernia with mesh on January 6. She was discharged to a skilled nursing facility on January 9 for additional physical therapy as well as lack of social support at home. She was just released from the skilled nursing facility today. She is accompanied by her son. She states that she is doing great. She denies any fevers or chills. She denies any nausea or vomiting. She denies any significant abdominal pain. She reports that she is having daily bowel movements. Occasionally she will have to take some MiraLAX.  Review of Systems     Objective:   Physical Exam BP 132/84  Pulse 78  Temp 98.4 F (36.9 C)  Resp 18  Ht 5\' 9"  (1.753 m)  Wt 198 lb (89.812 kg)  BMI 29.24 kg/m2  Gen: alert, NAD, non-toxic appearing Abd: soft, nontender, nondistended. Well-healed trocar sites. No cellulitis. No incisional hernia. No seroma Ext: no edema, no calf tenderness Skin: no rash, no jaundice     Assessment:     Status post laparoscopic repair of incisional ventral hernia with mesh January 6    Plan:     Overall I am very pleased with how she is doing. There is no evidence of significant seroma. Her incisions look well. I reminded her that she should not do any strenuous activity or heavy lifting for another 4 weeks. I'll see her back in the office in about 8 weeks to see how she is doing  [name]. Andrey Campanile, MD, FACS General, Bariatric, & Minimally Invasive Surgery [facility], [place]

## 2012-08-07 ENCOUNTER — Telehealth (INDEPENDENT_AMBULATORY_CARE_PROVIDER_SITE_OTHER): Payer: Self-pay | Admitting: General Surgery

## 2012-08-07 NOTE — Telephone Encounter (Signed)
Pt called to ask if it is still OK to wear the belt for her tummy.  She states she likes the support and comfort is gives.  Reassured the pt this is okay to wear for as long as she wants.

## 2012-09-04 ENCOUNTER — Encounter (INDEPENDENT_AMBULATORY_CARE_PROVIDER_SITE_OTHER): Payer: Self-pay | Admitting: *Deleted

## 2012-09-18 ENCOUNTER — Ambulatory Visit (INDEPENDENT_AMBULATORY_CARE_PROVIDER_SITE_OTHER): Payer: Medicare Other | Admitting: Internal Medicine

## 2012-09-27 ENCOUNTER — Ambulatory Visit (INDEPENDENT_AMBULATORY_CARE_PROVIDER_SITE_OTHER): Payer: Medicare Other | Admitting: General Surgery

## 2012-09-27 ENCOUNTER — Encounter (INDEPENDENT_AMBULATORY_CARE_PROVIDER_SITE_OTHER): Payer: Self-pay | Admitting: General Surgery

## 2012-09-27 VITALS — BP 124/84 | HR 78 | Temp 98.0°F | Resp 18 | Ht 69.0 in | Wt 194.2 lb

## 2012-09-27 DIAGNOSIS — Z09 Encounter for follow-up examination after completed treatment for conditions other than malignant neoplasm: Secondary | ICD-10-CM

## 2012-09-27 NOTE — Progress Notes (Signed)
Subjective:     Patient ID: Melissa Ross, female   DOB: 1930-09-08, 79 y.o.   MRN: [id number]  HPI 79 year old Caucasian female comes in for follow her regarding her laparoscopic ventral incisional hernia repair with mesh. Her surgery was done on 07/17/2012. I last saw her in the office on January 21. She states that she has been doing well. She denies any fever or chills. She denies any nausea or vomiting. She denies any diarrhea or constipation. She denies any abdominal pain.  Review of Systems     Objective:   Physical Exam BP 124/84  Pulse 78  Temp(Src) 98 F (36.7 C) (Temporal)  Resp 18  Ht 5\' 9"  (1.753 m)  Wt 194 lb 3.2 oz (88.089 kg)  BMI 28.67 kg/m2  Gen: alert, NAD, non-toxic appearing Pupils: equal, no scleral icterus Pulm: Lungs clear to auscultation, symmetric chest rise CV: regular rate and rhythm Abd: soft, nontender, nondistended. Well-healed trocar sites. No cellulitis. No incisional hernia Ext: no edema,  Skin: no rash, no jaundice     Assessment:     Status post laparoscopic repair of ventral incisional hernia with mesh     Plan:     Overall I think she is doing great. I have released her to gradually resume full activities. All of her questions were asked and answered. Followup as needed  [name]. Andrey Campanile, MD, FACS General, Bariatric, & Minimally Invasive Surgery [facility], [place]

## 2012-09-27 NOTE — Patient Instructions (Signed)
Can gradually resume full activities

## 2013-03-12 DIAGNOSIS — I219 Acute myocardial infarction, unspecified: Secondary | ICD-10-CM

## 2013-03-12 HISTORY — DX: Acute myocardial infarction, unspecified: I21.9

## 2013-03-18 ENCOUNTER — Inpatient Hospital Stay (HOSPITAL_COMMUNITY)
Admission: AD | Admit: 2013-03-18 | Discharge: 2013-03-20 | DRG: 282 | Disposition: A | Discharge status: Home / Self Care | Payer: Medicare Other | Source: Other Acute Inpatient Hospital | Attending: Cardiology | Admitting: Cardiology

## 2013-03-18 ENCOUNTER — Encounter (HOSPITAL_COMMUNITY): Payer: Self-pay

## 2013-03-18 DIAGNOSIS — I214 Non-ST elevation (NSTEMI) myocardial infarction: Principal | ICD-10-CM | POA: Diagnosis present

## 2013-03-18 DIAGNOSIS — Z880 Allergy status to penicillin: Secondary | ICD-10-CM

## 2013-03-18 DIAGNOSIS — Z9089 Acquired absence of other organs: Secondary | ICD-10-CM

## 2013-03-18 DIAGNOSIS — M1990 Unspecified osteoarthritis, unspecified site: Secondary | ICD-10-CM | POA: Diagnosis present

## 2013-03-18 DIAGNOSIS — E7800 Pure hypercholesterolemia, unspecified: Secondary | ICD-10-CM | POA: Diagnosis present

## 2013-03-18 DIAGNOSIS — J45909 Unspecified asthma, uncomplicated: Secondary | ICD-10-CM | POA: Diagnosis present

## 2013-03-18 DIAGNOSIS — Z961 Presence of intraocular lens: Secondary | ICD-10-CM

## 2013-03-18 DIAGNOSIS — D649 Anemia, unspecified: Secondary | ICD-10-CM | POA: Diagnosis present

## 2013-03-18 DIAGNOSIS — K219 Gastro-esophageal reflux disease without esophagitis: Secondary | ICD-10-CM | POA: Diagnosis present

## 2013-03-18 DIAGNOSIS — R079 Chest pain, unspecified: Secondary | ICD-10-CM

## 2013-03-18 DIAGNOSIS — Z9849 Cataract extraction status, unspecified eye: Secondary | ICD-10-CM

## 2013-03-18 DIAGNOSIS — I10 Essential (primary) hypertension: Secondary | ICD-10-CM | POA: Diagnosis present

## 2013-03-18 DIAGNOSIS — Z9851 Tubal ligation status: Secondary | ICD-10-CM

## 2013-03-18 LAB — COMPREHENSIVE METABOLIC PANEL
AST: 24 U/L (ref 0–37)
CO2: 25 mEq/L (ref 19–32)
Calcium: 8.4 mg/dL (ref 8.4–10.5)
Creatinine, Ser: 0.9 mg/dL (ref 0.50–1.10)
GFR calc Af Amer: 69 mL/min — ABNORMAL LOW (ref >90)
GFR calc non Af Amer: 59 mL/min — ABNORMAL LOW (ref >90)

## 2013-03-18 LAB — CBC WITH DIFFERENTIAL/PLATELET
Basophils Absolute: 0 10*3/uL (ref 0.0–0.1)
Basophils Relative: 1 % (ref 0–1)
Eosinophils Absolute: 0.1 10*3/uL (ref 0.0–0.7)
Hemoglobin: 11.7 g/dL — ABNORMAL LOW (ref 12.0–15.0)
MCH: 30.7 pg (ref 26.0–34.0)
MCHC: 33.2 g/dL (ref 30.0–36.0)
Monocytes Relative: 7 % (ref 3–12)
Neutrophils Relative %: 61 % (ref 43–77)
Platelets: 230 10*3/uL (ref 150–400)
RDW: 13.1 % (ref 11.5–15.5)

## 2013-03-18 LAB — MRSA PCR SCREENING: MRSA by PCR: NEGATIVE

## 2013-03-18 LAB — PROTIME-INR
INR: 1.14 (ref 0.00–1.49)
Prothrombin Time: 14.4 seconds (ref 11.6–15.2)

## 2013-03-18 LAB — TROPONIN I: Troponin I: 2.99 ng/mL (ref <0.30)

## 2013-03-18 MED ORDER — FAMOTIDINE 20 MG PO TABS
20.0000 mg | ORAL_TABLET | Freq: Two times a day (BID) | ORAL | Status: DC
  Administered 2013-03-18 – 2013-03-19 (×3): 20 mg via ORAL
  Filled 2013-03-18 (×6): qty 1

## 2013-03-18 MED ORDER — NITROGLYCERIN IN D5W 200-5 MCG/ML-% IV SOLN: 5.0000 ug/min | INTRAVENOUS | Status: DC

## 2013-03-18 MED ORDER — NITROGLYCERIN 0.4 MG SL SUBL: 0.4000 mg | SUBLINGUAL_TABLET | SUBLINGUAL | Status: DC | PRN

## 2013-03-18 MED ORDER — PNEUMOCOCCAL VAC POLYVALENT 25 MCG/0.5ML IJ INJ
0.5000 mL | INJECTION | INTRAMUSCULAR | Status: AC
  Filled 2013-03-18: qty 0.5

## 2013-03-18 MED ORDER — ASPIRIN EC 81 MG PO TBEC
81.0000 mg | DELAYED_RELEASE_TABLET | Freq: Every day | ORAL | Status: DC
  Filled 2013-03-18: qty 1

## 2013-03-18 MED ORDER — ASPIRIN 81 MG PO CHEW
324.0000 mg | CHEWABLE_TABLET | ORAL | Status: AC
  Administered 2013-03-18: 324 mg via ORAL
  Filled 2013-03-18: qty 1

## 2013-03-18 MED ORDER — ASPIRIN 81 MG PO CHEW
324.0000 mg | CHEWABLE_TABLET | ORAL | Status: AC
  Administered 2013-03-19: 324 mg via ORAL
  Filled 2013-03-18: qty 3
  Filled 2013-03-18: qty 4

## 2013-03-18 MED ORDER — SODIUM CHLORIDE 0.9 % IJ SOLN
3.0000 mL | INTRAMUSCULAR | Status: DC | PRN
  Administered 2013-03-19 (×2): 3 mL via INTRAVENOUS

## 2013-03-18 MED ORDER — ATORVASTATIN CALCIUM 80 MG PO TABS
80.0000 mg | ORAL_TABLET | Freq: Every day | ORAL | Status: DC
  Administered 2013-03-18 – 2013-03-19 (×2): 80 mg via ORAL
  Filled 2013-03-18 (×5): qty 1

## 2013-03-18 MED ORDER — ACETAMINOPHEN 325 MG PO TABS
650.0000 mg | ORAL_TABLET | ORAL | Status: DC | PRN
  Administered 2013-03-19: 650 mg via ORAL
  Filled 2013-03-18: qty 2

## 2013-03-18 MED ORDER — SODIUM CHLORIDE 0.9 % IJ SOLN
3.0000 mL | Freq: Two times a day (BID) | INTRAMUSCULAR | Status: DC
  Administered 2013-03-18 – 2013-03-19 (×2): 3 mL via INTRAVENOUS

## 2013-03-18 MED ORDER — ASPIRIN 300 MG RE SUPP
300.0000 mg | RECTAL | Status: AC
  Filled 2013-03-18: qty 1

## 2013-03-18 MED ORDER — SODIUM CHLORIDE 0.9 % IV SOLN: 250.0000 mL | INTRAVENOUS | Status: DC | PRN

## 2013-03-18 MED ORDER — CLOPIDOGREL BISULFATE 300 MG PO TABS
300.0000 mg | ORAL_TABLET | Freq: Once | ORAL | Status: AC
  Administered 2013-03-18: 300 mg via ORAL
  Filled 2013-03-18: qty 1

## 2013-03-18 MED ORDER — SODIUM CHLORIDE 0.9 % IV SOLN
INTRAVENOUS | Status: DC
  Administered 2013-03-19: via INTRAVENOUS

## 2013-03-18 MED ORDER — HEPARIN (PORCINE) IN NACL 100-0.45 UNIT/ML-% IJ SOLN
1050.0000 [IU]/h | INTRAMUSCULAR | Status: DC
  Administered 2013-03-18: 950 [IU]/h via INTRAVENOUS
  Filled 2013-03-18 (×2): qty 250

## 2013-03-18 MED ORDER — SODIUM CHLORIDE 0.9 % IV SOLN
INTRAVENOUS | Status: DC
  Administered 2013-03-18 – 2013-03-19 (×2): via INTRAVENOUS

## 2013-03-18 MED ORDER — METOPROLOL TARTRATE 12.5 MG HALF TABLET
12.5000 mg | ORAL_TABLET | Freq: Two times a day (BID) | ORAL | Status: DC
  Administered 2013-03-18 – 2013-03-20 (×4): 12.5 mg via ORAL
  Filled 2013-03-18 (×6): qty 1

## 2013-03-18 MED ORDER — DIAZEPAM 5 MG PO TABS
5.0000 mg | ORAL_TABLET | ORAL | Status: AC
  Administered 2013-03-19: 5 mg via ORAL
  Filled 2013-03-18: qty 1

## 2013-03-18 MED ORDER — ONDANSETRON HCL 4 MG/2ML IJ SOLN: 4.0000 mg | Freq: Four times a day (QID) | INTRAMUSCULAR | Status: DC | PRN

## 2013-03-18 MED ORDER — HEPARIN BOLUS VIA INFUSION
4000.0000 [IU] | Freq: Once | INTRAVENOUS | Status: AC
  Administered 2013-03-18: 4000 [IU] via INTRAVENOUS
  Filled 2013-03-18: qty 4000

## 2013-03-18 NOTE — Progress Notes (Signed)
Paged Dr regarding elevated troponin 2.99

## 2013-03-18 NOTE — Progress Notes (Addendum)
ANTICOAGULATION CONSULT NOTE - Initial Consult  Pharmacy Consult for Heparin Indication: chest pain/ACS  Allergies  Allergen Reactions  . Neosporin [Neomycin-Bacitracin Zn-Polymyx]   . Penicillins   Patient Measurements: Height: 5\' 8"  (172.7 cm) Weight: 189 lb 6 oz (85.9 kg) IBW/kg (Calculated) : 63.9 Heparin Dosing Weight: 81.7 kg Vital Signs: Temp: 97.9 F (36.6 C) (09/07 1800) Temp src: Oral (09/07 1800) BP: 124/58 mmHg (09/07 1900) Pulse Rate: 80 (09/07 1900) Labs: No results found for this basename: HGB, HCT, PLT, APTT, LABPROT, INR, HEPARINUNFRC, CREATININE, CKTOTAL, CKMB, TROPONINI,  in the last 72 hours  Estimated Creatinine Clearance: 57.5 ml/min (by C-G formula based on Cr of 0.91).   Medical History: Past Medical History  Diagnosis Date  . Hypertension   . GERD (gastroesophageal reflux disease)   . Arthritis   . Ventral hernia   . Asthma    Assessment: 59 YOF admitted for ACS to start IV heparin. Patient was not anticoagulation prior to admission. Patient is currently chest pain free. Baseline INR and CBC pending. Cycling cardiac enzymes. Baseline SCr is 0.90 (last in Jan 2014).  BMET pending.   Goal of Therapy:  Heparin level 0.3-0.7 units/ml Monitor platelets by anticoagulation protocol: Yes   Plan:  1. Heparin bolus of 4000 units x1 2. Heparin drip rate at 950 units/hr.  3. Heparin level in 6 hours.  4. Daily heparin level and CBC.  5. Will follow-up baseline labs and make adjustments if needed.   Link Snuffer, PharmD, BCPS Clinical Pharmacist [phone] 03/18/2013,7:13 PM  Addendum: Baseline INR 1.14. SCr 0.9, ok to continue at current dosing.

## 2013-03-18 NOTE — H&P (Signed)
Melissa Ross is an 79 y.o. female.   Chief Complaint: Chest pain HPI: Patient is 79 year old female with past medical history significant for hypertension GERD arthritis, on no medications who was transferred from [facility] for further evaluation and treatment. Patient while in chair around 11 AM developed sudden onset of retrosternal chest pain radiating to neck grade 10 over 10 EKG done in the ER showed normal sinus rhythm with septal Q waves with minimal ST elevation repeat EKG showed the septal Q waves with tall R-wave in V2 with minimal ST elevation her patient received sublingual nitroglycerin and aspirin and was started on nitro drip with relief of chest pain EKG done here her showed normal sinus rhythm with septal Q waves with minimal elevation in V2 her when V3 and nondiagnostic Q waves in infiltrates. Patient presently denies any chest pain. Patient was noted to have her elevated troponin I. of 1.6 and was transferred to [facility] for further treatment. Patient denies any history of exertional chest pain. Denies any palpitation lightheadedness or syncope. Denies history of PND orthopnea or leg swelling. Denies any cardiac workup in the past.  Past Medical History  Diagnosis Date  . Hypertension   . GERD (gastroesophageal reflux disease)   . Arthritis   . Ventral hernia   . Asthma     Past Surgical History  Procedure Laterality Date  . Cholecystectomy    . Tubal ligation    . Toe surgery      right foot  . Tonsillectomy    . Appendectomy    . Eye surgery      left KPE  . Cataract extraction w/phaco  11/09/2011    Procedure: CATARACT EXTRACTION PHACO AND INTRAOCULAR LENS PLACEMENT (IOC);  Surgeon: Loraine Leriche T. Nile Riggs, MD;  Location: [facility] ORS;  Service: Ophthalmology;  Laterality: Right;  CDE 10.01  . Esophagogastroduodenoscopy (egd) with esophageal dilation  05/11/2012    Procedure: ESOPHAGOGASTRODUODENOSCOPY (EGD) WITH ESOPHAGEAL DILATION;  Surgeon: Malissa Hippo, MD;   Location: [facility] ENDO SUITE;  Service: Endoscopy;  Laterality: N/A;  . Ventral hernia repair  07/17/2012    Procedure: LAPAROSCOPIC VENTRAL HERNIA;  Surgeon: Atilano Ina, MD,FACS;  Location: [facility] OR;  Service: General;  Laterality: N/A;  . Insertion of mesh  07/17/2012    Procedure: INSERTION OF MESH;  Surgeon: Atilano Ina, MD,FACS;  Location: [facility] OR;  Service: General;  Laterality: N/A;    Family History  Problem Relation Age of Onset  . Malignant hyperthermia Neg Hx   . Pseudochol deficiency Neg Hx   . Hypotension Neg Hx   . Anesthesia problems Neg Hx   . Breast cancer    . Ovarian cancer    . Colon cancer     Social History:  reports that she has never smoked. She does not have any smokeless tobacco history on file. She reports that she does not drink alcohol or use illicit drugs.  Allergies:  Allergies  Allergen Reactions  . Neosporin [Neomycin-Bacitracin Zn-Polymyx]   . Penicillins     Medications Prior to Admission  Medication Sig Dispense Refill  . diphenhydrAMINE (BENADRYL) 25 mg capsule Take 25-50 mg by mouth daily as needed (helps ease choking).        No results found for this or any previous visit (from the past 48 hour(s)). No results found.  Review of Systems  Constitutional: Negative for chills and weight loss.  Eyes: Negative for blurred vision, double vision and photophobia.  Respiratory: Negative for cough,  hemoptysis and sputum production.   Cardiovascular: Positive for chest pain. Negative for palpitations, orthopnea, claudication and leg swelling.  Gastrointestinal: Negative for nausea, vomiting and abdominal pain.  Genitourinary: Negative for dysuria.  Neurological: Negative for dizziness and headaches.    There were no vitals taken for this visit. Physical Exam  Constitutional: She is oriented to person, place, and time.  HENT:  Head: Normocephalic and atraumatic.  Eyes: Conjunctivae are normal. No scleral icterus.  Neck: Normal range of motion. Neck  supple. No JVD present. No tracheal deviation present. No thyromegaly present.  Cardiovascular: Normal rate and regular rhythm.   Murmur (soft systolic murmur noted no S3 gallop) heard. Respiratory: Effort normal and breath sounds normal. No respiratory distress. She has no wheezes. She has no rales.  GI: Soft. Bowel sounds are normal. She exhibits no distension. There is no tenderness. There is no rebound and no guarding.  Musculoskeletal: She exhibits no edema and no tenderness.  Neurological: She is alert and oriented to person, place, and time.     Assessment/Plan Acute septal wall myocardial infarction Hypertension GERD Degenerative joint disease Plan As per orders Patient presently chest pain-free we'll continue with IV heparin nitrates statins and antiplatelet medications. Discussed with patient regarding left eye possible PTCA stenting its risk and benefits i.e. death MI stroke need for emergency CABG local last complications etc. and consents for PCI  [facility] N 03/18/2013, 6:14 PM

## 2013-03-19 ENCOUNTER — Encounter (HOSPITAL_COMMUNITY)
Admission: AD | Disposition: A | Discharge status: Home / Self Care | Payer: Self-pay | Source: Other Acute Inpatient Hospital | Attending: Cardiology

## 2013-03-19 HISTORY — PX: LEFT HEART CATHETERIZATION WITH CORONARY ANGIOGRAM: SHX5451

## 2013-03-19 LAB — BASIC METABOLIC PANEL
BUN: 19 mg/dL (ref 6–23)
CO2: 18 mEq/L — ABNORMAL LOW (ref 19–32)
Calcium: 8.9 mg/dL (ref 8.4–10.5)
Chloride: 117 mEq/L — ABNORMAL HIGH (ref 96–112)
GFR calc non Af Amer: 64 mL/min — ABNORMAL LOW (ref >90)
Glucose, Bld: 106 mg/dL — ABNORMAL HIGH (ref 70–99)
Sodium: 142 mEq/L (ref 135–145)

## 2013-03-19 LAB — LIPID PANEL
Cholesterol: 92 mg/dL (ref 0–200)
HDL: 26 mg/dL — ABNORMAL LOW (ref >39)
Total CHOL/HDL Ratio: 3.5 RATIO

## 2013-03-19 LAB — TROPONIN I
Troponin I: 0.92 ng/mL (ref <0.30)
Troponin I: 0.52 ng/mL (ref <0.30)

## 2013-03-19 LAB — T4, FREE: Free T4: 1.41 ng/dL (ref 0.80–1.80)

## 2013-03-19 LAB — CBC
MCV: 93.4 fL (ref 78.0–100.0)
Platelets: 185 10*3/uL (ref 150–400)
RBC: 3.34 MIL/uL — ABNORMAL LOW (ref 3.87–5.11)
WBC: 6 10*3/uL (ref 4.0–10.5)

## 2013-03-19 LAB — HEPARIN LEVEL (UNFRACTIONATED): Heparin Unfractionated: 0.31 IU/mL (ref 0.30–0.70)

## 2013-03-19 SURGERY — LEFT HEART CATHETERIZATION WITH CORONARY ANGIOGRAM
Anesthesia: LOCAL

## 2013-03-19 MED ORDER — MIDAZOLAM HCL 2 MG/2ML IJ SOLN
INTRAMUSCULAR | Status: AC
  Filled 2013-03-19: qty 2

## 2013-03-19 MED ORDER — ONDANSETRON HCL 4 MG/2ML IJ SOLN: 4.0000 mg | Freq: Four times a day (QID) | INTRAMUSCULAR | Status: DC | PRN

## 2013-03-19 MED ORDER — ASPIRIN 81 MG PO CHEW
81.0000 mg | CHEWABLE_TABLET | Freq: Every day | ORAL | Status: DC
  Administered 2013-03-20: 81 mg via ORAL
  Filled 2013-03-19: qty 1

## 2013-03-19 MED ORDER — SODIUM CHLORIDE 0.9 % IV SOLN: INTRAVENOUS | Status: AC

## 2013-03-19 MED ORDER — HEPARIN (PORCINE) IN NACL 2-0.9 UNIT/ML-% IJ SOLN
INTRAMUSCULAR | Status: AC
  Filled 2013-03-19: qty 1000

## 2013-03-19 MED ORDER — FENTANYL CITRATE 0.05 MG/ML IJ SOLN
INTRAMUSCULAR | Status: AC
  Filled 2013-03-19: qty 2

## 2013-03-19 MED ORDER — LIDOCAINE HCL (PF) 1 % IJ SOLN
INTRAMUSCULAR | Status: AC
  Filled 2013-03-19: qty 30

## 2013-03-19 MED ORDER — RAMIPRIL 2.5 MG PO CAPS
2.5000 mg | ORAL_CAPSULE | Freq: Every day | ORAL | Status: DC
  Administered 2013-03-19 – 2013-03-20 (×2): 2.5 mg via ORAL
  Filled 2013-03-19 (×2): qty 1

## 2013-03-19 MED ORDER — INFLUENZA VAC SPLIT QUAD 0.5 ML IM SUSP
0.5000 mL | INTRAMUSCULAR | Status: DC
  Filled 2013-03-19 (×2): qty 0.5

## 2013-03-19 MED ORDER — NITROGLYCERIN 0.2 MG/ML ON CALL CATH LAB
INTRAVENOUS | Status: AC
  Filled 2013-03-19: qty 1

## 2013-03-19 MED ORDER — ACETAMINOPHEN 325 MG PO TABS: 650.0000 mg | ORAL_TABLET | ORAL | Status: DC | PRN

## 2013-03-19 MED FILL — Nitroglycerin IV Soln 100 MCG/ML in D5W: INTRAVENOUS | Qty: 250 | Status: AC

## 2013-03-19 NOTE — Progress Notes (Signed)
Subjective:  Patient denies any chest pain or shortness of breath. Cardiac enzymes are trending down. EKG this a.m. shows ST-T wave changes in anterolateral leads  Objective:  Vital Signs in the last 24 hours: Temp:  [97.1 F (36.2 C)-98.4 F (36.9 C)] 98.4 F (36.9 C) (09/08 0800) Pulse Rate:  [65-95] 70 (09/08 1023) Resp:  [10-20] 11 (09/08 1000) BP: (101-133)/(39-83) 109/47 mmHg (09/08 1023) SpO2:  [96 %-100 %] 100 % (09/08 1000) Weight:  [77.474 kg (170 lb 12.8 oz)-85.9 kg (189 lb 6 oz)] 77.474 kg (170 lb 12.8 oz) (09/08 0415)  Intake/Output from previous day: 09/07 0701 - 09/08 0700 In: 2053.1 [P.O.:240; I.V.:1813.1] Out: 1350 [Urine:1350] Intake/Output from this shift: Total I/O In: 340 [I.V.:340] Out: 325 [Urine:325]  Physical Exam: Neck: no adenopathy, no carotid bruit, no JVD and supple, symmetrical, trachea midline Lungs: clear to auscultation bilaterally Heart: regular rate and rhythm, S1, S2 normal, no murmur, click, rub or gallop Abdomen: soft, non-tender; bowel sounds normal; no masses,  no organomegaly Extremities: extremities normal, atraumatic, no cyanosis or edema  Lab Results:  Recent Labs  03/18/13 2021 03/19/13 0145  WBC 8.3 6.0  HGB 11.7* 10.3*  PLT 230 185    Recent Labs  03/19/13 0145 03/19/13 0633  NA 142 137  K 2.5* 4.4  CL 117* 105  CO2 18* 23  GLUCOSE 98 106*  BUN 14 19  CREATININE 0.59 0.84    Recent Labs  03/19/13 0035 03/19/13 0633  TROPONINI 0.92* 0.52*   Hepatic Function Panel  Recent Labs  03/18/13 2021  PROT 5.8*  ALBUMIN 3.3*  AST 24  ALT 12  ALKPHOS 105  BILITOT 0.3    Recent Labs  03/19/13 0145  CHOL 92   No results found for this basename: PROTIME,  in the last 72 hours  Imaging: Imaging results have been reviewed and No results found.  Cardiac Studies:  Assessment/Plan:  Status post acute non-Q-wave myocardial infarction Hypertension  GERD  Degenerative joint disease   Hypercholesteremia Acute on chronic anemia secondary to hydration Plan Continue present management Discussed again with patient regarding left eye possible PTCA stenting its risk and benefits and consents for PCI  LOS: 1 day    Melissa Ross N 03/19/2013, 11:13 AM

## 2013-03-19 NOTE — Progress Notes (Signed)
CRITICAL VALUE ALERT  Critical value received:  Potassium 2.5 and calcium 5.5  Date of notification:  03/19/13  Time of notification:  0333  Critical value read back:yes  Nurse who received alert:  Joesph July RN  MD notified :  Dr. Sharyn Lull seen critical labs and ordered repeat labs to be drawn

## 2013-03-19 NOTE — Progress Notes (Signed)
ANTICOAGULATION CONSULT NOTE Pharmacy Consult for Heparin Indication: chest pain/ACS  Allergies  Allergen Reactions  . Neosporin [Neomycin-Bacitracin Zn-Polymyx]   . Penicillins   Patient Measurements: Height: 5\' 8"  (172.7 cm) Weight: 189 lb 6 oz (85.9 kg) IBW/kg (Calculated) : 63.9 Heparin Dosing Weight: 81.7 kg Vital Signs: Temp: 97.6 F (36.4 C) (09/08 0200) Temp src: Oral (09/08 0200) BP: 107/62 mmHg (09/08 0200) Pulse Rate: 69 (09/08 0200) Labs:  Recent Labs  03/18/13 2021 03/19/13 0035 03/19/13 0145  HGB 11.7*  --  10.3*  HCT 35.2*  --  31.2*  PLT 230  --  185  APTT >200*  --   --   LABPROT 14.4  --   --   INR 1.14  --   --   HEPARINUNFRC  --   --  0.31  CREATININE 0.90  --   --   TROPONINI 2.99* 0.92*  --     Estimated Creatinine Clearance: 58.2 ml/min (by C-G formula based on Cr of 0.9).  Assessment: 79 y.o. female with chest pain for heparin  Goal of Therapy:  Heparin level 0.3-0.7 units/ml Monitor platelets by anticoagulation protocol: Yes   Plan:  Increase Heparin 1050 units/hr to keep within goal range F/U after cath today  Geannie Risen, PharmD, BCPS

## 2013-03-19 NOTE — Progress Notes (Signed)
Utilization review completed.  P.J. Kyngston Pickelsimer,RN,BSN Case Manager [phone]  

## 2013-03-20 LAB — BASIC METABOLIC PANEL
CO2: 28 mEq/L (ref 19–32)
Calcium: 9.2 mg/dL (ref 8.4–10.5)
Chloride: 104 mEq/L (ref 96–112)
Creatinine, Ser: 0.91 mg/dL (ref 0.50–1.10)
Glucose, Bld: 113 mg/dL — ABNORMAL HIGH (ref 70–99)

## 2013-03-20 LAB — CBC
HCT: 36.9 % (ref 36.0–46.0)
MCH: 30.8 pg (ref 26.0–34.0)
MCV: 93.9 fL (ref 78.0–100.0)
Platelets: 214 10*3/uL (ref 150–400)
RBC: 3.93 MIL/uL (ref 3.87–5.11)
RDW: 13.2 % (ref 11.5–15.5)

## 2013-03-20 MED ORDER — ASPIRIN 81 MG PO CHEW: 81.0000 mg | CHEWABLE_TABLET | Freq: Every day | ORAL | Status: DC

## 2013-03-20 MED ORDER — METOPROLOL SUCCINATE ER 25 MG PO TB24: 25.0000 mg | ORAL_TABLET | Freq: Every day | ORAL | Status: DC

## 2013-03-20 MED ORDER — RAMIPRIL 2.5 MG PO CAPS: 2.5000 mg | ORAL_CAPSULE | Freq: Every day | ORAL | Status: DC

## 2013-03-20 MED ORDER — CLOPIDOGREL BISULFATE 75 MG PO TABS: 75.0000 mg | ORAL_TABLET | Freq: Every day | ORAL | Status: DC

## 2013-03-20 MED ORDER — NITROGLYCERIN 0.4 MG SL SUBL: 0.4000 mg | SUBLINGUAL_TABLET | SUBLINGUAL | Status: DC | PRN

## 2013-03-20 MED ORDER — ATORVASTATIN CALCIUM 20 MG PO TABS: 20.0000 mg | ORAL_TABLET | Freq: Every day | ORAL | Status: DC

## 2013-03-20 MED ORDER — FAMOTIDINE 20 MG PO TABS: 20.0000 mg | ORAL_TABLET | Freq: Two times a day (BID) | ORAL | Status: DC

## 2013-03-20 NOTE — Discharge Summary (Signed)
NAMEAALAYAH, Melissa Ross              ACCOUNT NO.:  [id number]  MEDICAL RECORD NO.:  [id number]  LOCATION:  3W26C                        FACILITY:  [facility]  PHYSICIAN:  Renette Hsu N. Sharyn Lull, M.D. DATE OF BIRTH:  [birth year]  DATE OF ADMISSION:  03/18/2013 DATE OF DISCHARGE:  03/20/2013                              DISCHARGE SUMMARY   ADMITTING DIAGNOSES: 1. Possible acute septal wall myocardial infarction. 2. Hypertension. 3. Gastroesophageal reflux disease. 4. Degenerative joint disease.  DISCHARGE DIAGNOSES: 1. Status post non-Q-wave myocardial infarction. 2. Possible Takotsubo cardiomyopathy. 3. Hypertension. 4. Hypercholesteremia. 5. Gastroesophageal reflux disease. 6. Degenerative joint disease.  DISCHARGE HOME MEDICATIONS: 1. Aspirin 81 mg 1 tablet daily. 2. Atorvastatin 20 mg 1 tablet daily. 3. Clopidogrel 75 mg 1 tablet daily. 4. Pepcid 20 mg twice daily. 5. Toprol-XL 25 mg 1 tablet daily. 6. Ramipril 2.5 mg 1 capsule daily. 7. Nitrostat 0.4 mg sublingual use as directed.  DIET:  Low salt, low cholesterol.  ACTIVITY:  Increase activity slowly as tolerated.  Post cardiac cath instructions have been given.  Follow up with me in 1 week.  CONDITION AT DISCHARGE:  Stable.  BRIEF HISTORY AND HOSPITAL COURSE:  Melissa Ross is a 79 year old female with past medical history significant for hypertension, GERD, arthritis on no medications, was transferred from [facility] for further evaluation and treatment.  The patient while in church around 11 a.m., developed sudden onset of retrosternal chest pain radiating to the neck, grade 10/10.  EKG done in the ER showed normal sinus rhythm with septal Q-waves with minimal ST elevation.  Repeat EKG showed septal Q- waves with R-wave in V2 with minimal ST elevation.  The patient received sublingual nitro, aspirin and was started on nitro drip with relief of chest pain.  EKG done here at [facility] upon arrival showed normal sinus  rhythm with septal Q-waves with minimal ST elevation in lead V2 and V3 and nondiagnostic Q-waves in the inferior leads.  The patient presently denies any chest pain, and was noted to have elevated troponin- I of 1.6.  The patient was transferred to [facility] for further evaluation and treatment.  The patient denies any history of exertional chest pain.  Denies any palpitation, lightheadedness, or syncope. Denies history of PND, orthopnea, or leg swelling.  Denies any cardiac workup in the past.  PHYSICAL EXAMINATION:  GENERAL:  She was alert, awake, oriented x3, in no acute distress.  Hemodynamically stable.  HEENT:  Conjunctivae was pink. NECK:  Supple.  No JVD.  No bruit. LUNGS:  Clear to auscultation without rhonchi or rales. CARDIOVASCULAR:  S1, S2 was normal.  There was soft systolic murmur.  No S3 gallop. ABDOMEN:  Soft.  Bowel sounds were present.  Nontender. EXTREMITIES:  There is no clubbing, cyanosis, or edema.  LABORATORY DATA:  Sodium was 139, potassium 3.8, BUN 23, creatinine 0.90.  Blood sugar was 109.  Repeat fasting blood sugar was 98.  First set of troponin I here was 2.99.  Repeat troponin next set was 0.92, yesterday was 0.52, this morning is 0.32 which is trending down.  Her hemoglobin was 11.7, hematocrit 35.2, white count of 8.3; today hemoglobin 12.1, hematocrit 36.9, white count of  5.7.  Her cholesterol was 92, triglycerides 59, HDL was low at 26, LDL 54.  Repeat EKG yesterday showed normal sinus rhythm.  Possible inferoposterior wall MI, ST-T wave changes in anterolateral leads with minimal ST elevation.  BRIEF HOSPITAL COURSE:  The patient was admitted to step-down unit.  The patient was started on IV heparin and nitrates.  The patient ruled in for small non-Q-wave myocardial infarction due to typical chest pain and mildly elevated troponin-I.  The patient subsequently underwent left cardiac cath with selective left and right coronary angiography as  per procedure report.  The patient did not had any critical angiographic stenosis, but had significant moderate RCA and LAD stenosis with TIMI grade 3 distal flow.  The patient did not had any further episodes of chest pain during the hospital stay.  Her groin is stable with no evidence of hematoma or bruit.  LV gram was suggestive of marked anterolateral wall hypokinesia, suggestive of Takotsubo cardiomyopathy. The patient was started on ACE inhibitors, which she is tolerating well. The patient has been ambulating in hallway without any problems.  The patient will be discharged home on above medications, and will be followed up in my office in 1 week.     Eduardo Osier. Sharyn Lull, M.D.     MNH/MEDQ  D:  03/20/2013  T:  03/20/2013  Job:  [id number]

## 2013-03-20 NOTE — Discharge Summary (Signed)
  Discharge summary dictated on in 2014 dictation number is [phone]

## 2013-03-20 NOTE — Cardiovascular Report (Signed)
NAMEPOONAM, WOEHRLE              ACCOUNT NO.:  [id number]  MEDICAL RECORD NO.:  [id number]  LOCATION:  3W26C                        FACILITY:  [facility]  PHYSICIAN:  Tyjai Matuszak N. Sharyn Lull, M.D. DATE OF BIRTH:  1931/04/13  DATE OF PROCEDURE:  03/19/2013 DATE OF DISCHARGE:                           CARDIAC CATHETERIZATION   PROCEDURE:  Left cardiac catheterization with selective left and right coronary angiography, left ventriculography via right groin using Judkins technique.  INDICATION FOR THE PROCEDURE:  Ms. Helf is a 79 year old female with a past medical history significant for hypertension, GERD, arthritis, on no medications who was transferred from [facility] for further evaluation and treatment.  The patient while in the church around 11 a.m. developed sudden onset of retrosternal chest pain radiating to the neck, grade 10/10.  EKG done in the ER showed normal sinus rhythm with septal Q-waves and minimal ST elevation.  Repeat EKG showed septal Q-waves and tall R-waves in V2 with minimal ST elevation. The patient received sublingual nitro, aspirin, and was started on nitro drip with relief of chest pain.  EKG done here showed normal sinus rhythm with septal Q-waves with minimal ST elevation in leads V2 and V3 and nondiagnostic Q-waves in inferior leads.  The patient presently denies any chest pain.  The patient was noted to have elevated troponin- I of 1.6, and was transferred to [facility] for further treatment.  The patient denies any history of exertional chest pain.  Denies any palpitation, lightheadedness, or syncope.  Denies history of PND, orthopnea, or leg swelling.  Denies any cardiac workup in the past.  The patient's EKG this morning showed normal sinus rhythm with ST-T wave changes in anterolateral leads with minimal ST elevation.  Her cardiac enzymes are trending down due to typical anginal chest pain, EKG changes, and elevated cardiac enzymes.  Discussed  with the patient regarding left cath, possible PTCA, stenting, its risks and benefits, i.e., death, MI, stroke, need for emergency CABG, local vascular complications, etc., and consented for PCI.  PROCEDURE:  After obtaining the informed consent, the patient was brought to the cath lab and was placed on fluoroscopy table.  Right groin was prepped and draped in usual fashion.  With the help of thin- walled needle, 6-French arterial sheath was placed.  The sheath was aspirated and flushed.  Next, 6-French left Judkins catheter was advanced over the wire under fluoroscopic guidance up to the ascending aorta.  Wire was pulled out.  The catheter was aspirated and connected to the Manifold.  Catheter was further advanced and engaged into left coronary ostium.  Multiple views of the left system were taken.  Next, catheter was disengaged and was pulled out over the wire and was replaced with 5-French right Judkins catheter, which was advanced over the wire under fluoroscopic guidance up to the ascending aorta.  Wire was pulled out.  The catheter was aspirated and connected to the Manifold.  Catheter was further advanced and engaged into right coronary ostium.  Multiple views of the right system were taken.  Next, catheter was disengaged and was pulled out over the wire and was replaced with 5- French pigtail catheter, which was advanced over the wire under fluoroscopic  guidance up to the ascending aorta.  Wire was pulled out. The catheter was aspirated and connected to the Manifold.  Catheter was further advanced across the aortic valve into the LV.  LV pressures were recorded.  Next, LV graphy was done in 30-degree RAO position.  Post- angiographic pressures were recorded from LV and then pullback pressures were recorded from the aorta.  There was no gradient across the aortic valve.  Next, the pigtail catheter was pulled out over the wire. Sheaths were aspirated and flushed.  FINDINGS:  LV  showed anterolateral wall severe hypokinesia, EF of 45% to 50%.  Left main was patent.  LAD has 20% to 25% proximal and 50% to 60% mid stenosis.  Diagonal 1 was very small, which was patent.  Diagonal 2 was small, which was patent.  Left circumflex has 15% to 20% ostial and 20% to 30% mid stenosis.  OM 1 and OM 2 were less than 0.5 mm, which were very small.  OM 3 was large, which was patent.  RCA has 50% to 60% proximal stenosis with haziness with TIMI grade 3 distal flow.  PDA and PLV branches were patent.  The patient tolerated the procedure well. There were no complications.  The patient was transferred to recovery room in stable condition.  The patient appears to have takotsubo cardiomyopathy, we will add ACE inhibitors and continue with beta- blockers, and rest of her medications.     Eduardo Osier. Sharyn Lull, M.D.     MNH/MEDQ  D:  03/19/2013  T:  03/20/2013  Job:  [id number]

## 2013-10-03 ENCOUNTER — Encounter: Payer: Self-pay | Admitting: Podiatry

## 2013-10-03 ENCOUNTER — Ambulatory Visit (INDEPENDENT_AMBULATORY_CARE_PROVIDER_SITE_OTHER): Payer: Medicare Other | Admitting: Podiatry

## 2013-10-03 VITALS — BP 134/73 | HR 70 | Resp 16 | Ht 68.5 in | Wt 185.0 lb

## 2013-10-03 DIAGNOSIS — M79609 Pain in unspecified limb: Secondary | ICD-10-CM

## 2013-10-03 DIAGNOSIS — B351 Tinea unguium: Secondary | ICD-10-CM

## 2013-10-03 DIAGNOSIS — Q828 Other specified congenital malformations of skin: Secondary | ICD-10-CM

## 2013-10-03 NOTE — Progress Notes (Signed)
   Subjective:    Patient ID: Melissa Ross, female    DOB: [birth year], 80 y.o.   MRN: [id number]  HPI N foot pain        L right lateral dorsal foot        D 5 days ago swelling began        O after touring        C swelling and itching, hard pain at lateral right foot        A walking        T No treatment tried  Pt also complains of hard callouses and thick B/L 1st toenails. Review of Systems  Constitutional: Positive for fatigue.  HENT: Positive for hearing loss and trouble swallowing.   Eyes: Positive for visual disturbance.  Respiratory: Positive for cough and shortness of breath.   Cardiovascular: Positive for chest pain and leg swelling.  Gastrointestinal: Positive for constipation.  Endocrine: Positive for polyuria.  Genitourinary: Positive for frequency.  Musculoskeletal: Positive for arthralgias and back pain.  Skin: Positive for rash.  Allergic/Immunologic: Negative.   Neurological: Negative.   Hematological: Negative.   Psychiatric/Behavioral: Negative.        Objective:   Physical Exam  Orientated x3 space white female  Vascular: DP and PTs are 1/4 bilaterally  Neurological: Sensation to 10 g monofilament wire intact 5/5 bilaterally. Vibratory nonreactive bilaterally. Ankle reflexes reactive bilaterally.  Dermatological: Surgical scar second right toe noted. The hallux nails are hypertrophic, discolored and tender to pressure. Nucleated keratoses plantar right foot x2.  Musculoskeletal: There is no restriction of ankle, subtalar, midtarsal joints bilaterally. There is mild palpable tenderness on the dorsal lateral aspect of right foot without a palpable lesions or skin lesions noted in the area.        Assessment & Plan:    Assessment: Sprain lateral right foot with minimal symptoms Satisfactory neurovascular status Symptomatic onychomycoses 1-5 Porokeratoses x2  Plan: Nails x2 and keratoses x2 debrided without any bleeding. Advised patient  to wear his sturdy shoes and return if the pain in the right foot increases over time. Reappoint at patient's request.

## 2013-10-04 ENCOUNTER — Encounter: Payer: Self-pay | Admitting: Podiatry

## 2013-11-15 ENCOUNTER — Ambulatory Visit (INDEPENDENT_AMBULATORY_CARE_PROVIDER_SITE_OTHER): Payer: Medicare Other | Admitting: General Surgery

## 2013-11-15 ENCOUNTER — Encounter (INDEPENDENT_AMBULATORY_CARE_PROVIDER_SITE_OTHER): Payer: Self-pay | Admitting: General Surgery

## 2013-11-15 VITALS — BP 130/70 | HR 74 | Temp 97.6°F | Resp 14 | Ht 69.0 in | Wt 195.6 lb

## 2013-11-15 DIAGNOSIS — Z09 Encounter for follow-up examination after completed treatment for conditions other than malignant neoplasm: Secondary | ICD-10-CM

## 2013-11-15 NOTE — Patient Instructions (Signed)
Follow up with Dr Terrence Dupont regarding your fatigue [facility] [phone]  Wear abdominal binder as needed

## 2013-11-15 NOTE — Progress Notes (Signed)
Subjective:     Patient ID: Melissa Ross, female   DOB: 10-Dec-1930, 80 y.o.   MRN: [id number]  HPI 80 year old Caucasian female comes in because of concerns of possible hernia recurrence. She underwent laparoscopic repair of ventral hernia with mesh in January 2014. I last saw her in March 2014. She states that she had been moving some mulch around in her yard and she noticed some discomfort in her upper abdomen. It is more noticeable first thing in the morning. She also notices the bulge occasionally. She denies any problems with bowel movements. It is never hard or swollen.  She did suffer a myocardial infarction in the fall requiring a cardiac catheterization.  She complains of recent issues with fatigue PMHx, PSHx, SOCHx, FAMHx, ALL reviewed and unchanged  Review of Systems     Objective:   Physical Exam BP 130/70  Pulse 74  Temp(Src) 97.6 F (36.4 C)  Resp 14  Ht 5\' 9"  (1.753 m)  Wt 195 lb 9.6 oz (88.724 kg)  BMI 28.87 kg/m2  Gen: alert, NAD, non-toxic appearing Pupils: equal, no scleral icterus Pulm: Lungs clear to auscultation, symmetric chest rise CV: regular rate and rhythm Abd: soft, nontender, nondistended. Well-healed trocar sites. No cellulitis. No incisional hernia. Upper midline diastasis. No hernia. Pt examined supine and standing. No palpable defects Skin: no rash, no jaundice     Assessment:     S/p lap ventral hernia repair with mesh     Plan:     There is no evidence of hernia recurrence. She has a normal bowing affect of her abdominal wall due to the mesh brigding the defect. I advised her to try to avoid heavy lifting. He also discussed wearing her abdominal binder for support.  With respect to her fatigue I encouraged her to contact her cardiologist. Followup as needed  Leighton Ruff. Redmond Pulling, MD, FACS General, Bariatric, & Minimally Invasive Surgery [facility], [place]

## 2014-06-20 ENCOUNTER — Encounter (HOSPITAL_COMMUNITY): Payer: Self-pay | Admitting: Cardiology

## 2014-08-22 LAB — PULMONARY FUNCTION TEST

## 2015-07-24 ENCOUNTER — Encounter (INDEPENDENT_AMBULATORY_CARE_PROVIDER_SITE_OTHER): Payer: Self-pay | Admitting: *Deleted

## 2015-08-25 ENCOUNTER — Encounter (INDEPENDENT_AMBULATORY_CARE_PROVIDER_SITE_OTHER): Payer: Self-pay | Admitting: Internal Medicine

## 2015-08-25 ENCOUNTER — Telehealth (INDEPENDENT_AMBULATORY_CARE_PROVIDER_SITE_OTHER): Payer: Self-pay | Admitting: *Deleted

## 2015-08-25 ENCOUNTER — Other Ambulatory Visit (INDEPENDENT_AMBULATORY_CARE_PROVIDER_SITE_OTHER): Payer: Self-pay | Admitting: Internal Medicine

## 2015-08-25 ENCOUNTER — Other Ambulatory Visit (INDEPENDENT_AMBULATORY_CARE_PROVIDER_SITE_OTHER): Payer: Self-pay | Admitting: *Deleted

## 2015-08-25 ENCOUNTER — Encounter (INDEPENDENT_AMBULATORY_CARE_PROVIDER_SITE_OTHER): Payer: Self-pay | Admitting: *Deleted

## 2015-08-25 ENCOUNTER — Ambulatory Visit (INDEPENDENT_AMBULATORY_CARE_PROVIDER_SITE_OTHER): Payer: Medicare Other | Admitting: Internal Medicine

## 2015-08-25 VITALS — BP 180/100 | HR 72 | Temp 97.6°F | Ht 66.0 in | Wt 181.4 lb

## 2015-08-25 DIAGNOSIS — K5909 Other constipation: Secondary | ICD-10-CM | POA: Diagnosis not present

## 2015-08-25 DIAGNOSIS — K5900 Constipation, unspecified: Secondary | ICD-10-CM | POA: Insufficient documentation

## 2015-08-25 DIAGNOSIS — Z1211 Encounter for screening for malignant neoplasm of colon: Secondary | ICD-10-CM

## 2015-08-25 MED ORDER — SOD PHOS MONO-SOD PHOS DIBASIC 1.102-0.398 G PO TABS: 32.0000 | ORAL_TABLET | Freq: Once | ORAL | Status: DC

## 2015-08-25 NOTE — Patient Instructions (Addendum)
Colonscopy. The risks and benefits such as perforation, bleeding, and infection were reviewed with the patient and is agreeable. 

## 2015-08-25 NOTE — Progress Notes (Addendum)
Subjective:    Patient ID: Melissa Ross, female    DOB: 1931/05/08, 82 y.o.   MRN: [id number]  HPI Referred to office by Dr. Woody Seller for constipation.  She was last seen in our office in 2012 for abdominal pain and nausea.  She tells me she was in the hospital at [facility] In January for dehydration and abdominal pain. She was admitted for less than 24 hrs.  She had been taking Pepto Bismol. She had a BM in the hospital and it was black. The CT scan revealed.Colonic diverticulosis. No evidence of acute diverticulitis. Moderate stool within terminal ileum, probable incompetent ileocecal valve.  Mild redundant sigmoid colon. No evidence of distal colonic  obstruction. Her BMs are normal now. She can eat anything she wants. She usually has a BM x 3 a day. Her stools are brown in color.  She has never undergone a colonoscopy.    05/11/2012  EGD with ED.  Indications: Patient is 82 year old Caucasian female with chronic GERD who is been having intermittent solid food dysphagia. She presented to her primary care physician Dr. Woody Seller this morning with signs and symptoms of food impaction. She she is able to swallow saliva. It is felt that she has spontaneously passed food bolus. She is undergoing therapeutic EGD.  Impression: Stricture at GE junction. Was dilated with a balloon to 19 mm. Moderate size sliding hiatal hernia. Few antral telangiectasia along with changes of nonerosive gastritis.     Review of Systems Past Medical History  Diagnosis Date  . Hypertension   . GERD (gastroesophageal reflux disease)   . Arthritis   . Ventral hernia   . Asthma   . Myocardial infarction [facility]) 03/2013    Past Surgical History  Procedure Laterality Date  . Cholecystectomy    . Tubal ligation    . Toe surgery      right foot  . Tonsillectomy    . Appendectomy    . Eye surgery      left  KPE  . Cataract extraction w/phaco  11/09/2011    Procedure: CATARACT EXTRACTION PHACO AND INTRAOCULAR LENS PLACEMENT (IOC);  Surgeon: Elta Guadeloupe T. Gershon Crane, MD;  Location: [facility];  Service: Ophthalmology;  Laterality: Right;  CDE 10.01  . Esophagogastroduodenoscopy (egd) with esophageal dilation  05/11/2012    Procedure: ESOPHAGOGASTRODUODENOSCOPY (EGD) WITH ESOPHAGEAL DILATION;  Surgeon: Rogene Houston, MD;  Location: [facility];  Service: Endoscopy;  Laterality: N/A;  . Ventral hernia repair  07/17/2012    Procedure: LAPAROSCOPIC VENTRAL HERNIA;  Surgeon: Gayland Curry, MD,FACS;  Location: [place];  Service: General;  Laterality: N/A;  . Insertion of mesh  07/17/2012    Procedure: INSERTION OF MESH;  Surgeon: Gayland Curry, MD,FACS;  Location: [place];  Service: General;  Laterality: N/A;  . Left heart catheterization with coronary angiogram N/A 03/19/2013    Procedure: LEFT HEART CATHETERIZATION WITH CORONARY ANGIOGRAM;  Surgeon: Clent Demark, MD;  [facility] C[facility] LAB;  Service: Cardiovascular;  Laterality: N/A;    Allergies  Allergen Reactions  . Neosporin [Neomycin-Bacitracin Zn-Polymyx]   . Penicillins     Current Outpatient Prescriptions on File Prior to Visit  Medication Sig Dispense Refill  . aspirin 81 MG chewable tablet Chew 1 tablet (81 mg total) by mouth daily. 30 tablet 3  . atorvastatin (LIPITOR) 20 MG tablet Take 1 tablet (20 mg total) by mouth daily at 6 PM. 30 tablet 3  . clopidogrel (PLAVIX) 75 MG tablet Take 1 tablet (  75 mg total) by mouth daily. 30 tablet 3  . famotidine (PEPCID) 20 MG tablet Take 1 tablet (20 mg total) by mouth 2 (two) times daily. 60 tablet 3  . nitroGLYCERIN (NITROSTAT) 0.4 MG SL tablet Place 1 tablet (0.4 mg total) under the tongue every 5 (five) minutes x 3 doses as needed for chest pain. 25 tablet 12   No current facility-administered medications on file prior to visit.        Objective:   Physical Exam Blood pressure 180/100, pulse 72,  temperature 97.6 F (36.4 C), height 5\' 6"  (1.676 m), weight 181 lb 6.4 oz (82.283 kg). aAlert and oriented. Skin warm and dry. Oral mucosa is moist.   . Sclera anicteric, conjunctivae is pink. Thyroid not enlarged. No cervical lymphadenopathy. Lungs clear. Heart regular rate and rhythm.  Abdomen is soft. Bowel sounds are positive. No hepatomegaly. No abdominal masses felt. No tenderness.  No edema to lower extremities.      Lot [id number] Ex 9/17    Assessment & Plan:  Constipation which has now resolved. She has a BM x 3 a day since coming home from hospital.  Colonoscopy. The risks and benefits such as perforation, bleeding, and infection were reviewed with the patient and is agreeable.

## 2015-08-25 NOTE — Telephone Encounter (Signed)
Patient needs osmo pill prep 

## 2015-08-25 NOTE — Telephone Encounter (Signed)
Per Dr Terrence Dupont it is ok for Melissa Ross to hold Plavix 5 days and ASA 2 days prior to TCS sch'd 09/24/15

## 2015-09-11 ENCOUNTER — Encounter (HOSPITAL_COMMUNITY): Payer: Self-pay | Admitting: *Deleted

## 2015-09-11 ENCOUNTER — Other Ambulatory Visit: Payer: Self-pay

## 2015-09-11 ENCOUNTER — Emergency Department (HOSPITAL_COMMUNITY): Payer: Medicare Other

## 2015-09-11 ENCOUNTER — Observation Stay (HOSPITAL_COMMUNITY)
Admission: EM | Admit: 2015-09-11 | Discharge: 2015-09-13 | Disposition: A | Discharge status: Home / Self Care | Payer: Medicare Other | Attending: Internal Medicine | Admitting: Internal Medicine

## 2015-09-11 DIAGNOSIS — M889 Osteitis deformans of unspecified bone: Secondary | ICD-10-CM | POA: Diagnosis not present

## 2015-09-11 DIAGNOSIS — E785 Hyperlipidemia, unspecified: Secondary | ICD-10-CM | POA: Diagnosis present

## 2015-09-11 DIAGNOSIS — Z7902 Long term (current) use of antithrombotics/antiplatelets: Secondary | ICD-10-CM | POA: Insufficient documentation

## 2015-09-11 DIAGNOSIS — Z880 Allergy status to penicillin: Secondary | ICD-10-CM | POA: Insufficient documentation

## 2015-09-11 DIAGNOSIS — R109 Unspecified abdominal pain: Secondary | ICD-10-CM

## 2015-09-11 DIAGNOSIS — W19XXXA Unspecified fall, initial encounter: Secondary | ICD-10-CM | POA: Diagnosis not present

## 2015-09-11 DIAGNOSIS — R262 Difficulty in walking, not elsewhere classified: Secondary | ICD-10-CM | POA: Insufficient documentation

## 2015-09-11 DIAGNOSIS — M79605 Pain in left leg: Secondary | ICD-10-CM

## 2015-09-11 DIAGNOSIS — M79606 Pain in leg, unspecified: Secondary | ICD-10-CM | POA: Diagnosis not present

## 2015-09-11 DIAGNOSIS — R0602 Shortness of breath: Secondary | ICD-10-CM | POA: Diagnosis present

## 2015-09-11 DIAGNOSIS — M79604 Pain in right leg: Secondary | ICD-10-CM

## 2015-09-11 DIAGNOSIS — Y92510 Bank as the place of occurrence of the external cause: Secondary | ICD-10-CM | POA: Diagnosis not present

## 2015-09-11 DIAGNOSIS — R531 Weakness: Secondary | ICD-10-CM | POA: Diagnosis not present

## 2015-09-11 DIAGNOSIS — E43 Unspecified severe protein-calorie malnutrition: Secondary | ICD-10-CM | POA: Diagnosis not present

## 2015-09-11 DIAGNOSIS — R55 Syncope and collapse: Secondary | ICD-10-CM | POA: Diagnosis not present

## 2015-09-11 DIAGNOSIS — I2510 Atherosclerotic heart disease of native coronary artery without angina pectoris: Secondary | ICD-10-CM | POA: Diagnosis present

## 2015-09-11 DIAGNOSIS — M79609 Pain in unspecified limb: Secondary | ICD-10-CM | POA: Insufficient documentation

## 2015-09-11 DIAGNOSIS — G8929 Other chronic pain: Secondary | ICD-10-CM | POA: Diagnosis present

## 2015-09-11 DIAGNOSIS — Z79899 Other long term (current) drug therapy: Secondary | ICD-10-CM | POA: Insufficient documentation

## 2015-09-11 DIAGNOSIS — R110 Nausea: Secondary | ICD-10-CM | POA: Diagnosis not present

## 2015-09-11 DIAGNOSIS — J209 Acute bronchitis, unspecified: Secondary | ICD-10-CM | POA: Insufficient documentation

## 2015-09-11 DIAGNOSIS — W228XXA Striking against or struck by other objects, initial encounter: Secondary | ICD-10-CM | POA: Diagnosis not present

## 2015-09-11 DIAGNOSIS — Z6826 Body mass index (BMI) 26.0-26.9, adult: Secondary | ICD-10-CM | POA: Insufficient documentation

## 2015-09-11 DIAGNOSIS — Z7982 Long term (current) use of aspirin: Secondary | ICD-10-CM | POA: Diagnosis not present

## 2015-09-11 DIAGNOSIS — I252 Old myocardial infarction: Secondary | ICD-10-CM | POA: Diagnosis not present

## 2015-09-11 DIAGNOSIS — J40 Bronchitis, not specified as acute or chronic: Secondary | ICD-10-CM | POA: Diagnosis not present

## 2015-09-11 DIAGNOSIS — J9690 Respiratory failure, unspecified, unspecified whether with hypoxia or hypercapnia: Secondary | ICD-10-CM | POA: Insufficient documentation

## 2015-09-11 DIAGNOSIS — I10 Essential (primary) hypertension: Secondary | ICD-10-CM | POA: Diagnosis not present

## 2015-09-11 HISTORY — DX: Respiratory failure, unspecified, unspecified whether with hypoxia or hypercapnia: J96.90

## 2015-09-11 LAB — URINALYSIS, ROUTINE W REFLEX MICROSCOPIC
Bilirubin Urine: NEGATIVE
GLUCOSE, UA: NEGATIVE mg/dL
Hgb urine dipstick: NEGATIVE
Ketones, ur: NEGATIVE mg/dL
LEUKOCYTES UA: NEGATIVE
NITRITE: NEGATIVE
PH: 7 (ref 5.0–8.0)
Protein, ur: NEGATIVE mg/dL
SPECIFIC GRAVITY, URINE: 1.01 (ref 1.005–1.030)

## 2015-09-11 LAB — BASIC METABOLIC PANEL
ANION GAP: 8 (ref 5–15)
BUN: 28 mg/dL — ABNORMAL HIGH (ref 6–20)
CHLORIDE: 104 mmol/L (ref 101–111)
CO2: 28 mmol/L (ref 22–32)
Calcium: 9.2 mg/dL (ref 8.9–10.3)
Creatinine, Ser: 1.17 mg/dL — ABNORMAL HIGH (ref 0.44–1.00)
GFR calc Af Amer: 49 mL/min — ABNORMAL LOW (ref >60)
GFR, EST NON AFRICAN AMERICAN: 42 mL/min — AB (ref >60)
GLUCOSE: 132 mg/dL — AB (ref 65–99)
POTASSIUM: 4.8 mmol/L (ref 3.5–5.1)
SODIUM: 140 mmol/L (ref 135–145)

## 2015-09-11 LAB — BRAIN NATRIURETIC PEPTIDE: B NATRIURETIC PEPTIDE 5: 25.9 pg/mL (ref 0.0–100.0)

## 2015-09-11 LAB — CBC
HEMATOCRIT: 37.6 % (ref 36.0–46.0)
HEMOGLOBIN: 11.9 g/dL — AB (ref 12.0–15.0)
MCH: 30.1 pg (ref 26.0–34.0)
MCHC: 31.6 g/dL (ref 30.0–36.0)
MCV: 94.9 fL (ref 78.0–100.0)
Platelets: 221 10*3/uL (ref 150–400)
RBC: 3.96 MIL/uL (ref 3.87–5.11)
RDW: 13.8 % (ref 11.5–15.5)
WBC: 8 10*3/uL (ref 4.0–10.5)

## 2015-09-11 LAB — TSH: TSH: 1.161 u[IU]/mL (ref 0.350–4.500)

## 2015-09-11 LAB — I-STAT TROPONIN, ED: Troponin i, poc: 0 ng/mL (ref 0.00–0.08)

## 2015-09-11 LAB — TROPONIN I: Troponin I: <0.03 ng/mL (ref <0.031)

## 2015-09-11 MED ORDER — HEPARIN SODIUM (PORCINE) 5000 UNIT/ML IJ SOLN
5000.0000 [IU] | Freq: Three times a day (TID) | INTRAMUSCULAR | Status: DC
  Administered 2015-09-11 – 2015-09-13 (×6): 5000 [IU] via SUBCUTANEOUS
  Filled 2015-09-11 (×6): qty 1

## 2015-09-11 MED ORDER — RAMIPRIL 10 MG PO CAPS: 10.0000 mg | ORAL_CAPSULE | Freq: Every day | ORAL | Status: DC

## 2015-09-11 MED ORDER — ACETAMINOPHEN 650 MG RE SUPP: 650.0000 mg | Freq: Four times a day (QID) | RECTAL | Status: DC | PRN

## 2015-09-11 MED ORDER — ALUM & MAG HYDROXIDE-SIMETH 200-200-20 MG/5ML PO SUSP: 30.0000 mL | Freq: Four times a day (QID) | ORAL | Status: DC | PRN

## 2015-09-11 MED ORDER — ONDANSETRON HCL 4 MG/2ML IJ SOLN
4.0000 mg | Freq: Four times a day (QID) | INTRAMUSCULAR | Status: DC | PRN
  Administered 2015-09-13: 4 mg via INTRAVENOUS
  Filled 2015-09-11: qty 2

## 2015-09-11 MED ORDER — SENNOSIDES-DOCUSATE SODIUM 8.6-50 MG PO TABS
1.0000 | ORAL_TABLET | Freq: Every evening | ORAL | Status: DC | PRN
  Administered 2015-09-11: 1 via ORAL
  Filled 2015-09-11: qty 1

## 2015-09-11 MED ORDER — ATORVASTATIN CALCIUM 10 MG PO TABS
5.0000 mg | ORAL_TABLET | Freq: Every day | ORAL | Status: DC
Start: 2015-09-11 — End: 2015-09-11

## 2015-09-11 MED ORDER — CLOPIDOGREL BISULFATE 75 MG PO TABS
75.0000 mg | ORAL_TABLET | Freq: Every day | ORAL | Status: DC
  Administered 2015-09-12 – 2015-09-13 (×2): 75 mg via ORAL
  Filled 2015-09-11 (×2): qty 1

## 2015-09-11 MED ORDER — CLOPIDOGREL BISULFATE 75 MG PO TABS: 75.0000 mg | ORAL_TABLET | Freq: Every day | ORAL | Status: DC

## 2015-09-11 MED ORDER — SODIUM CHLORIDE 0.9% FLUSH
3.0000 mL | Freq: Two times a day (BID) | INTRAVENOUS | Status: DC
  Administered 2015-09-11 – 2015-09-13 (×4): 3 mL via INTRAVENOUS

## 2015-09-11 MED ORDER — BUDESONIDE-FORMOTEROL FUMARATE 160-4.5 MCG/ACT IN AERO
2.0000 | INHALATION_SPRAY | Freq: Two times a day (BID) | RESPIRATORY_TRACT | Status: DC
Start: 2015-09-12 — End: 2015-09-12
  Filled 2015-09-11: qty 6

## 2015-09-11 MED ORDER — FAMOTIDINE 20 MG PO TABS
20.0000 mg | ORAL_TABLET | Freq: Two times a day (BID) | ORAL | Status: DC
  Administered 2015-09-11 – 2015-09-13 (×4): 20 mg via ORAL
  Filled 2015-09-11 (×4): qty 1

## 2015-09-11 MED ORDER — ACETAMINOPHEN 325 MG PO TABS: 650.0000 mg | ORAL_TABLET | Freq: Four times a day (QID) | ORAL | Status: DC | PRN

## 2015-09-11 MED ORDER — ONDANSETRON HCL 4 MG PO TABS
4.0000 mg | ORAL_TABLET | Freq: Four times a day (QID) | ORAL | Status: DC | PRN
  Administered 2015-09-12: 4 mg via ORAL
  Filled 2015-09-11: qty 1

## 2015-09-11 MED ORDER — ALBUTEROL SULFATE HFA 108 (90 BASE) MCG/ACT IN AERS: 1.0000 | INHALATION_SPRAY | Freq: Four times a day (QID) | RESPIRATORY_TRACT | Status: DC | PRN

## 2015-09-11 MED ORDER — NITROGLYCERIN 0.4 MG SL SUBL: 0.4000 mg | SUBLINGUAL_TABLET | SUBLINGUAL | Status: DC | PRN

## 2015-09-11 MED ORDER — ONDANSETRON HCL 4 MG/2ML IJ SOLN
4.0000 mg | Freq: Once | INTRAMUSCULAR | Status: DC
  Filled 2015-09-11: qty 2

## 2015-09-11 MED ORDER — ATORVASTATIN CALCIUM 10 MG PO TABS
5.0000 mg | ORAL_TABLET | Freq: Every day | ORAL | Status: DC
  Administered 2015-09-12 – 2015-09-13 (×2): 5 mg via ORAL
  Filled 2015-09-11 (×2): qty 1

## 2015-09-11 MED ORDER — ASPIRIN 81 MG PO CHEW: 81.0000 mg | CHEWABLE_TABLET | Freq: Every day | ORAL | Status: DC

## 2015-09-11 MED ORDER — ASPIRIN 81 MG PO CHEW: 324.0000 mg | CHEWABLE_TABLET | Freq: Once | ORAL | Status: DC

## 2015-09-11 MED ORDER — ASPIRIN 81 MG PO CHEW
81.0000 mg | CHEWABLE_TABLET | Freq: Every day | ORAL | Status: DC
  Administered 2015-09-11 – 2015-09-13 (×3): 81 mg via ORAL
  Filled 2015-09-11 (×3): qty 1

## 2015-09-11 MED ORDER — CARVEDILOL 3.125 MG PO TABS
3.1250 mg | ORAL_TABLET | Freq: Two times a day (BID) | ORAL | Status: DC
  Administered 2015-09-11 – 2015-09-13 (×4): 3.125 mg via ORAL
  Filled 2015-09-11 (×4): qty 1

## 2015-09-11 NOTE — ED Notes (Signed)
PT was at the bank when she experienced a syncopal episode. PT states she felt "sick" and nauseated and became very sweaty. EMS reports low palpated BP upon arrival and PT was "out of it." EMS reports PT was drenched in sweat. EMS reports ST elevation in leads one and two that resolved in route.

## 2015-09-11 NOTE — H&P (Signed)
[facility] History and Physical   Patient: Melissa Ross [id number]   PCP: Melissa Ross., MD DOB: February 02, 1931   DOA: 09/11/2015   DOS: 09/11/2015   DOS: the patient was seen and examined on 09/11/2015  Referring physician: Dr. Regenia Skeeter Chief Complaint: Passing out episode  HPI: Melissa Ross is a 82 y.o. female with Past medical history of coronary artery disease, hypertension, dyslipidemia, chronic leg pain. The patient presents with complaints of passing out episode. Patient mentions that while she was in the shower this morning she suddenly felt nauseated and did not have any vomiting but felt significantly weak and went back to sleep. Patient later on was feeling better and went to bank, but she was feeling dizzy and lightheaded as well as feeling warm. She was diaphoretic there. Patient does not remember the actual event but she remembers that she was feeling weak and laid her head down on the task of the bank teller. As per the history provided from the EMS patient passed out and hit the head at the desk. [place] teller mentioned that her pulse was low, her blood pressure was low as per EMS as well. At the time of my evaluation here the patient does not have any complains of headache, vision changes, chest pain, shortness of breath, palpitation, nausea, vomiting, diarrhea, constipation, focal deficit, tingling or numbness anywhere. Patient mentions that she was recently asked to increase her Coreg and she has only taken 2 doses of the new dose, before that she has been taking 2 tablets twice a day of her older dose. No other medication change has been reported. She has cough for last 1 month. Has been treated with Z-Pak as well as prednisone as an outpatient. She also has chronic leg pain primarily affecting her in the middle of the night and feels like burning as well as tightness.  The patient is coming from home At her baseline ambulates without any support And is independent  for most of her ADL; manages her medication on her own.  Review of Systems: as mentioned in the history of present illness.  A comprehensive review of the other systems is negative.  Past Medical History  Diagnosis Date  . Hypertension   . GERD (gastroesophageal reflux disease)   . Arthritis   . Ventral hernia   . Asthma   . Myocardial infarction ([place]) 03/2013  . Respiratory failure [facility])     August 2016, requiring intubation   Past Surgical History  Procedure Laterality Date  . Cholecystectomy    . Tubal ligation    . Toe surgery      right foot  . Tonsillectomy    . Appendectomy    . Eye surgery      left KPE  . Cataract extraction w/phaco  11/09/2011    Procedure: CATARACT EXTRACTION PHACO AND INTRAOCULAR LENS PLACEMENT (IOC);  Surgeon: Melissa Guadeloupe T. Gershon Crane, MD;  Location: [facility];  Service: Ophthalmology;  Laterality: Right;  CDE 10.01  . Esophagogastroduodenoscopy (egd) with esophageal dilation  05/11/2012    Procedure: ESOPHAGOGASTRODUODENOSCOPY (EGD) WITH ESOPHAGEAL DILATION;  Surgeon: Melissa Houston, MD;  [facility];  Service: Endoscopy;  Laterality: N/A;  . Ventral hernia repair  07/17/2012    Procedure: LAPAROSCOPIC VENTRAL HERNIA;  Surgeon: Melissa Curry, MD,FACS;  Location: [place];  Service: General;  Laterality: N/A;  . Insertion of mesh  07/17/2012    Procedure: INSERTION OF MESH;  Surgeon: Melissa Curry, MD,FACS;  Location: [place];  Service: General;  Laterality: N/A;  . Left heart catheterization with coronary angiogram N/A 03/19/2013    Procedure: LEFT HEART CATHETERIZATION WITH CORONARY ANGIOGRAM;  Surgeon: Melissa Demark, MD;  Location: [facility] CATH LAB;  Service: Cardiovascular;  Laterality: N/A;   Social History:  reports that she has never smoked. She does not have any smokeless tobacco history on file. She reports that she does not drink alcohol or use illicit drugs.  Allergies  Allergen Reactions  . Neosporin [Neomycin-Bacitracin Zn-Polymyx] Other (See  Comments)    blisters  . Penicillins Swelling and Rash    Has patient had a PCN reaction causing immediate rash, facial/tongue/throat swelling, SOB or lightheadedness with hypotension:unknown Has patient had a PCN reaction causing severe rash involving mucus membranes or skin necrosis: unknown Has patient had a PCN reaction that required hospitalization: unknown Has patient had a PCN reaction occurring within the last 10 years: unknown If all of the above answers are "NO", then may proceed with Cephalosporin use.      Family History  Problem Relation Age of Onset  . Malignant hyperthermia Neg Hx   . Pseudochol deficiency Neg Hx   . Hypotension Neg Hx   . Anesthesia problems Neg Hx   . Breast cancer    . Ovarian cancer    . Colon cancer      Prior to Admission medications   Medication Sig Start Date End Date Taking? Authorizing Provider  albuterol (PROAIR HFA) 108 (90 Base) MCG/ACT inhaler Inhale 1-2 puffs into the lungs every 6 (six) hours as needed for wheezing or shortness of breath.  05/22/15  Yes Historical Provider, MD  Ascorbic Acid (VITAMIN C PO) Take 4-5 tablets by mouth daily as needed (cold).   Yes Historical Provider, MD  aspirin 81 MG chewable tablet Chew 1 tablet (81 mg total) by mouth daily. Patient taking differently: Chew 81 mg by mouth at bedtime.  03/20/13  Yes Melissa Forward, MD  atorvastatin (LIPITOR) 10 MG tablet Take 5 mg by mouth daily. 09/02/15  Yes Historical Provider, MD  BREO ELLIPTA 100-25 MCG/INH AEPB Inhale 1-2 puffs into the lungs every 6 (six) hours as needed (shortness of breath).  06/17/15  Yes Historical Provider, MD  carvedilol (COREG) 6.25 MG tablet Take 6.25 mg by mouth 2 (two) times daily. 09/02/15  Yes Historical Provider, MD  clopidogrel (PLAVIX) 75 MG tablet Take 1 tablet (75 mg total) by mouth daily. 03/20/13  Yes Melissa Forward, MD  famotidine (PEPCID) 20 MG tablet Take 1 tablet (20 mg total) by mouth 2 (two) times daily. 03/20/13  Yes Melissa Forward,  MD  nitroGLYCERIN (NITROSTAT) 0.4 MG SL tablet Place 1 tablet (0.4 mg total) under the tongue every 5 (five) minutes x 3 doses as needed for chest pain. 03/20/13  Yes Melissa Forward, MD  ramipril (ALTACE) 10 MG capsule Take 10 mg by mouth daily.   Yes Historical Provider, MD  atorvastatin (LIPITOR) 20 MG tablet Take 1 tablet (20 mg total) by mouth daily at 6 PM. Patient not taking: Reported on 09/11/2015 03/20/13   Melissa Forward, MD  sodium phosphates (OSMOPREP) 1.102-0.398 g TABS tablet Take 32 tablets by mouth once. Patient not taking: Reported on 09/11/2015 08/25/15   Butch Penny, NP    Physical Exam: Filed Vitals:   09/11/15 1645 09/11/15 1715 09/11/15 1745 09/11/15 1815  BP: 136/50 129/53 115/52 139/54  Pulse: 71 73 72 73  Resp: 10 17 18 16   Height:      Weight:  SpO2: 97% 96% 94% 95%    General: Alert, Awake and Oriented to Time, Place and Person. Appear in mild distress Eyes: PERRL ENT: Oral Mucosa clear moist. Neck: no JVD Cardiovascular: S1 and S2 Present, no Murmur, Peripheral Pulses Present Respiratory: Bilateral Air entry equal and Decreased,  Clear to Auscultation, no Crackles, no wheezes Abdomen: Bowel Sound present, Soft and no tenderness Skin: no Rash Extremities: no Pedal edema, no calf tenderness Neurologic: Mental status AAOx3, speech normal, attention normal,  Cranial Nerves PERRL, EOM normal and present, Motor strength bilateral equal strength 5/5,  Sensation present to light touch,  Cerebellar test normal finger nose finger.  Labs on Admission:  CBC:  Recent Labs Lab 09/11/15 1720  WBC 8.0  HGB 11.9*  HCT 37.6  MCV 94.9  PLT 221    CMP     Component Value Date/Time   NA 140 09/11/2015 1720   K 4.8 09/11/2015 1720   CL 104 09/11/2015 1720   CO2 28 09/11/2015 1720   GLUCOSE 132* 09/11/2015 1720   BUN 28* 09/11/2015 1720   CREATININE 1.17* 09/11/2015 1720   CALCIUM 9.2 09/11/2015 1720   PROT 5.8* 03/18/2013 2021   ALBUMIN 3.3* 03/18/2013  2021   AST 24 03/18/2013 2021   ALT 12 03/18/2013 2021   ALKPHOS 105 03/18/2013 2021   BILITOT 0.3 03/18/2013 2021   GFRNONAA 42* 09/11/2015 1720   GFRAA 49* 09/11/2015 1720    No results for input(s): CKTOTAL, CKMB, CKMBINDEX, TROPONINI in the last 168 hours. BNP (last 3 results) No results for input(s): BNP in the last 8760 hours.  ProBNP (last 3 results) No results for input(s): PROBNP in the last 8760 hours.   Radiological Exams on Admission: Dg Chest 2 View  09/11/2015  CLINICAL DATA:  Syncope today, cold like symptoms EXAM: CHEST  2 VIEW COMPARISON:  07/16/2015 FINDINGS: Cardiomediastinal silhouette is stable. No acute infiltrate or pleural effusion. No pulmonary edema. Minimal perihilar increased bronchial markings without focal consolidation. Thoracic spine osteopenia. Mild degenerative changes mid thoracic spine. IMPRESSION: No infiltrate or pulmonary edema. Minimal perihilar increased bronchial markings without focal consolidation. Thoracic spine osteopenia. Electronically Signed   By: Lahoma Crocker M.D.   On: 09/11/2015 16:27   Ct Head Wo Contrast  09/11/2015  CLINICAL DATA:  Nausea and diaphoresis well at the bank, hypotensive. Fall. EXAM: CT HEAD WITHOUT CONTRAST CT CERVICAL SPINE WITHOUT CONTRAST TECHNIQUE: Multidetector CT imaging of the head and cervical spine was performed following the standard protocol without intravenous contrast. Multiplanar CT image reconstructions of the cervical spine were also generated. COMPARISON:  03/07/2015 and 03/18/2013 FINDINGS: CT HEAD FINDINGS Partially empty sella. Otherwise, The brainstem, cerebellum, cerebral peduncles, thalami, basal ganglia, basilar cisterns, and ventricular system appear within normal limits. Periventricular white matter and corona radiata hypodensities favor chronic ischemic microvascular white matter disease. No intracranial hemorrhage, mass lesion, or acute CVA. Mild chronic ethmoid sinusitis. CT CERVICAL SPINE FINDINGS  Spurring at the anterior C1-2 articulation. No cervical spine fracture or acute subluxation identified. Mild left foraminal stenosis due to uncinate spurring at C5-6. Mild central narrowing of the thecal sac at C3-4 due to a central disc protrusion. IMPRESSION: 1. No acute intracranial findings. No acute cervical spine fracture or subluxation. 2. Partially empty sella. 3. Mild chronic ethmoid sinusitis. 4. Mild left foraminal stenosis at C5-6 due to uncinate spurring 5. Mild central narrowing of the thecal sac at C3-4 due to a central disc protrusion. Electronically Signed   By: Cindra Eves.D.  On: 09/11/2015 16:52   Ct Cervical Spine Wo Contrast  09/11/2015  CLINICAL DATA:  Nausea and diaphoresis well at the bank, hypotensive. Fall. EXAM: CT HEAD WITHOUT CONTRAST CT CERVICAL SPINE WITHOUT CONTRAST TECHNIQUE: Multidetector CT imaging of the head and cervical spine was performed following the standard protocol without intravenous contrast. Multiplanar CT image reconstructions of the cervical spine were also generated. COMPARISON:  03/07/2015 and 03/18/2013 FINDINGS: CT HEAD FINDINGS Partially empty sella. Otherwise, The brainstem, cerebellum, cerebral peduncles, thalami, basal ganglia, basilar cisterns, and ventricular system appear within normal limits. Periventricular white matter and corona radiata hypodensities favor chronic ischemic microvascular white matter disease. No intracranial hemorrhage, mass lesion, or acute CVA. Mild chronic ethmoid sinusitis. CT CERVICAL SPINE FINDINGS Spurring at the anterior C1-2 articulation. No cervical spine fracture or acute subluxation identified. Mild left foraminal stenosis due to uncinate spurring at C5-6. Mild central narrowing of the thecal sac at C3-4 due to a central disc protrusion. IMPRESSION: 1. No acute intracranial findings. No acute cervical spine fracture or subluxation. 2. Partially empty sella. 3. Mild chronic ethmoid sinusitis. 4. Mild left  foraminal stenosis at C5-6 due to uncinate spurring 5. Mild central narrowing of the thecal sac at C3-4 due to a central disc protrusion. Electronically Signed   By: Van Clines M.D.   On: 09/11/2015 16:52   EKG: Independently reviewed. normal sinus rhythm, nonspecific ST and T waves changes.  Assessment/Plan 1. Syncopal episodes The patient is presenting with complaints of syncope. She also had another near syncope event earlier in the morning. With this the patient will be admitted in the hospital. Probability of medication induced with increase in dose of her Coreg is more likely. Requesting the patient to bring her medication bottles to the hospital. Possibility of cardiac syncope also cannot be ruled out. With this the patient will be admitted in the hospital to monitor her on telemetry. Check orthostatic in the morning. We will reduce the dose of the Coreg at present. Check an echo cardiogram in the morning. Recent echo program in August 2016 showed ejection fraction of 55-60%.  2. Chronic leg pain. Check 0000000 folic acid level as well as ABI in the morning. Patient's list multiple vitamin with her benign. Next and possibility of sleep apnea or restless leg syndrome cannot be ruled out. We'll prefer outpatient workup for this conditions.  3. Subacute bronchitis. Chest x-ray without any pneumonia. We'll continue to closely monitor.  4. Essential hypertension. Continue ramipril. Lower dose of the Coreg at present and may be able to resume tomorrow.  5. Coronary artery disease. EKG does not show any evidence of acute ischemia. We will monitor serial troponin for the patient. Continue aspirin and Plavix  Nutrition: Tolerating low-salt diet DVT Prophylaxis: subcutaneous Heparin  Advance goals of care discussion: full code   Consults: none  Family Communication: no family was present at bedside, at the time of interview. Disposition: Admitted as observation, telemetry  unit.  Author: Berle Mull, MD [facility] Pager: [phone] 09/11/2015  If 7PM-7AM, please contact night-coverage [email or web address] Password [email or web address]
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
## 2015-09-12 ENCOUNTER — Ambulatory Visit (HOSPITAL_COMMUNITY): Payer: Medicare Other

## 2015-09-12 ENCOUNTER — Ambulatory Visit (HOSPITAL_BASED_OUTPATIENT_CLINIC_OR_DEPARTMENT_OTHER): Payer: Medicare Other

## 2015-09-12 DIAGNOSIS — M79604 Pain in right leg: Secondary | ICD-10-CM

## 2015-09-12 DIAGNOSIS — E43 Unspecified severe protein-calorie malnutrition: Secondary | ICD-10-CM | POA: Insufficient documentation

## 2015-09-12 DIAGNOSIS — M79605 Pain in left leg: Secondary | ICD-10-CM

## 2015-09-12 DIAGNOSIS — M79606 Pain in leg, unspecified: Secondary | ICD-10-CM | POA: Diagnosis not present

## 2015-09-12 DIAGNOSIS — I2510 Atherosclerotic heart disease of native coronary artery without angina pectoris: Secondary | ICD-10-CM | POA: Diagnosis not present

## 2015-09-12 DIAGNOSIS — R55 Syncope and collapse: Secondary | ICD-10-CM | POA: Diagnosis not present

## 2015-09-12 DIAGNOSIS — J40 Bronchitis, not specified as acute or chronic: Secondary | ICD-10-CM | POA: Diagnosis not present

## 2015-09-12 LAB — CBC
HCT: 34.7 % — ABNORMAL LOW (ref 36.0–46.0)
Hemoglobin: 11 g/dL — ABNORMAL LOW (ref 12.0–15.0)
MCH: 30.2 pg (ref 26.0–34.0)
MCHC: 31.7 g/dL (ref 30.0–36.0)
MCV: 95.3 fL (ref 78.0–100.0)
Platelets: 218 K/uL (ref 150–400)
RBC: 3.64 MIL/uL — ABNORMAL LOW (ref 3.87–5.11)
RDW: 14.1 % (ref 11.5–15.5)
WBC: 7 K/uL (ref 4.0–10.5)

## 2015-09-12 LAB — TROPONIN I

## 2015-09-12 LAB — COMPREHENSIVE METABOLIC PANEL
ALBUMIN: 3.1 g/dL — AB (ref 3.5–5.0)
ALT: 10 U/L — ABNORMAL LOW (ref 14–54)
AST: 15 U/L (ref 15–41)
Alkaline Phosphatase: 106 U/L (ref 38–126)
Anion gap: 10 (ref 5–15)
BILIRUBIN TOTAL: 0.6 mg/dL (ref 0.3–1.2)
BUN: 25 mg/dL — AB (ref 6–20)
CALCIUM: 9 mg/dL (ref 8.9–10.3)
CO2: 26 mmol/L (ref 22–32)
CREATININE: 1.04 mg/dL — AB (ref 0.44–1.00)
Chloride: 104 mmol/L (ref 101–111)
GFR calc Af Amer: 56 mL/min — ABNORMAL LOW (ref >60)
GFR, EST NON AFRICAN AMERICAN: 49 mL/min — AB (ref >60)
GLUCOSE: 143 mg/dL — AB (ref 65–99)
Potassium: 4.7 mmol/L (ref 3.5–5.1)
Sodium: 140 mmol/L (ref 135–145)
TOTAL PROTEIN: 5.1 g/dL — AB (ref 6.5–8.1)

## 2015-09-12 LAB — VITAMIN B12: Vitamin B-12: 521 pg/mL (ref 180–914)

## 2015-09-12 LAB — GLUCOSE, CAPILLARY: Glucose-Capillary: 101 mg/dL — ABNORMAL HIGH (ref 65–99)

## 2015-09-12 LAB — FOLATE: Folate: 49.8 ng/mL (ref >5.9)

## 2015-09-12 MED ORDER — ENSURE ENLIVE PO LIQD
237.0000 mL | Freq: Two times a day (BID) | ORAL | Status: DC
  Administered 2015-09-13: 237 mL via ORAL

## 2015-09-12 NOTE — Progress Notes (Signed)
*  PRELIMINARY RESULTS* Echocardiogram 2D Echocardiogram has been performed.  Leavy Cella 09/12/2015, 4:38 PM

## 2015-09-12 NOTE — Progress Notes (Signed)
[facility] Progress Note  Patient: Melissa Ross [id number]   PCP: Glenda Chroman., MD DOB: [birth year]   DOA: 09/11/2015   DOS: 09/12/2015   Date of Service: the patient was seen and examined on 09/12/2015  Subjective: No events on telemetry noted no complaints of chest pain nausea vomiting or abdominal pain. No dizziness or lightheadedness. Nutrition: Tolerating oral diet Activity: Walking in the hallway Last BM: Prior to arrival  Assessment and Plan: 1. Syncopal episodes The patient is presenting with complaints of syncope. She also had another near syncope event earlier in the morning. With this the patient will be admitted in the hospital. Probability of medication induced with increase in dose of her Coreg is more likely. Requesting the patient to bring her medication bottles to the hospital. Possibility of cardiac syncope also cannot be ruled out. With this the patient will be admitted in the hospital to monitor her on telemetry. Check orthostatic in the morning. We will reduce the dose of the Coreg at present. Echogram currently pending Recent echo in August 2016 showed ejection fraction of 55-60%.  2. Chronic leg pain. ABI unremarkable, 0000000 and folic acid level and are also normal. possibility of sleep apnea or restless leg syndrome cannot be ruled out. We'll prefer outpatient workup for this conditions.  3. Subacute bronchitis. Chest x-ray without any pneumonia. We'll continue to closely monitor.  4. Essential hypertension. Discontinue ramipril due to orthostasis Lower dose of the Coreg at present and may be able to resume tomorrow.  5. Coronary artery disease. EKG does not show any evidence of acute ischemia. We will monitor serial troponin for the patient. Continue aspirin and Plavix  DVT Prophylaxis: subcutaneous Heparin Nutrition: Cardiac diet Advance goals of care discussion: Full code  Brief Summary of Hospitalization:  Daily update, Procedures:  Vascular ultrasound ABI, echocardiogram Consultants: none Antibiotics: Anti-infectives    None       Family Communication: family was present at bedside, at the time of interview.  Opportunity was given to ask question and all questions were answered satisfactorily.   Disposition:  Expected discharge date: 09/13/2015 Barriers to safe discharge: Echocardiogram   Intake/Output Summary (Last 24 hours) at 09/12/15 1736 Last data filed at 09/12/15 1300  Gross per 24 hour  Intake    483 ml  Output    800 ml  Net   -317 ml   Filed Weights   09/11/15 1550 09/11/15 2035 09/12/15 0421  Weight: 81.647 kg (180 lb) 81.3 kg (179 lb 3.7 oz) 79.7 kg (175 lb 11.3 oz)    Objective: Physical Exam: Filed Vitals:   09/12/15 1434 09/12/15 1514 09/12/15 1516 09/12/15 1517  BP: 152/71     Pulse: 89     Temp: 98.3 F (36.8 C)     TempSrc: Oral     Resp: 18     Height:      Weight:      SpO2: 95% 99% 96% 99%     General: Appear in mild distress, no Rash; Oral Mucosa moist. Cardiovascular: S1 and S2 Present, no Murmur, no JVD Respiratory: Bilateral Air entry present and Clear to Auscultation, no Crackles, no wheezes Abdomen: Bowel Sound present, Soft and no tenderness Extremities: no Pedal edema, no calf tenderness Neurology: Grossly no focal neuro deficit.  Data Reviewed: CBC:  Recent Labs Lab 09/11/15 1720 09/12/15 0044  WBC 8.0 7.0  HGB 11.9* 11.0*  HCT 37.6 34.7*  MCV 94.9 95.3  PLT 221 [id number]   Basic Metabolic Panel:  Recent Labs Lab 09/11/15 1720 09/12/15 0044  NA 140 140  K 4.8 4.7  CL 104 104  CO2 28 26  GLUCOSE 132* 143*  BUN 28* 25*  CREATININE 1.17* 1.04*  CALCIUM 9.2 9.0   Liver Function Tests:  Recent Labs Lab 09/12/15 0044  AST 15  ALT 10*  ALKPHOS 106  BILITOT 0.6  PROT 5.1*  ALBUMIN 3.1*   No results for input(s): LIPASE, AMYLASE in the last 168 hours. No results for input(s): AMMONIA in the last 168 hours.  Cardiac Enzymes:  Recent  Labs Lab 09/11/15 1947 09/12/15 0044 09/12/15 0559  TROPONINI <0.03 <0.03 <0.03    BNP (last 3 results)  Recent Labs  09/11/15 1947  BNP 25.9    CBG:  Recent Labs Lab 09/12/15 0706  GLUCAP 101*    No results found for this or any previous visit (from the past 240 hour(s)).   Studies: No results found.   Scheduled Meds: . aspirin  81 mg Oral Daily  . atorvastatin  5 mg Oral Daily  . carvedilol  3.125 mg Oral BID  . clopidogrel  75 mg Oral Daily  . famotidine  20 mg Oral BID  . feeding supplement (ENSURE ENLIVE)  237 mL Oral BID BM  . heparin  5,000 Units Subcutaneous 3 times per day  . ondansetron (ZOFRAN) IV  4 mg Intravenous Once  . sodium chloride flush  3 mL Intravenous Q12H   Continuous Infusions:  PRN Meds: acetaminophen **OR** acetaminophen, alum & mag hydroxide-simeth, nitroGLYCERIN, ondansetron **OR** ondansetron (ZOFRAN) IV, senna-docusate  Time spent: 30 minutes  Author: Berle Mull, MD [facility] Pager: [phone] 09/12/2015 5:36 PM  If 7PM-7AM, please contact night-coverage at [email or web address], password [facility]

## 2015-09-12 NOTE — Evaluation (Signed)
Physical Therapy Evaluation Patient Details Name: Melissa Ross MRN: [id number] DOB: 09/09/1930 Today's Date: 09/12/2015   History of Present Illness  Patient is a 82 y/o female with hx of HTN, MI, CAD, chronic LE pain and respiratory failure presents with episode of passing out. Workup pending.  Clinical Impression  Patient tolerated ambulation with supervision progressing to Mod I without difficulty or balance deficits. No symptoms during ambulation. Reports mobility is at baseline. Pt independent and driving PTA. Encouraged daily ambulation with RN to maintain strength/mobility while in hospital. Pt agreeable. Pt does not require skilled therapy services as pt functioning at baseline. Discharge from therapy.     Follow Up Recommendations No PT follow up    Equipment Recommendations  None recommended by PT    Recommendations for Other Services       Precautions / Restrictions Precautions Precautions: None Restrictions Weight Bearing Restrictions: No      Mobility  Bed Mobility Overal bed mobility: Modified Independent                Transfers Overall transfer level: Needs assistance Equipment used: None Transfers: Sit to/from Stand Sit to Stand: Modified independent (Device/Increase time)         General transfer comment: Able to stand without assist. No dizziness. Transferred to chair post ambulation bout.  Ambulation/Gait Ambulation/Gait assistance: Modified independent (Device/Increase time);Supervision Ambulation Distance (Feet): 200 Feet Assistive device: None Gait Pattern/deviations: Step-through pattern;Decreased stride length Gait velocity: decreased Gait velocity interpretation: <1.8 ft/sec, indicative of risk for recurrent falls General Gait Details: Slow, steady gait with no apparent balance deficits. Pt reports as baseline. HR stable. No dizziness.   Stairs            Wheelchair Mobility    Modified Rankin (Stroke Patients Only)        Balance Overall balance assessment: Needs assistance Sitting-balance support: Feet supported;No upper extremity supported Sitting balance-Leahy Scale: Normal     Standing balance support: During functional activity Standing balance-Leahy Scale: Good                               Pertinent Vitals/Pain Pain Assessment: No/denies pain    Home Living Family/patient expects to be discharged to:: Private residence Living Arrangements: Alone Available Help at Discharge: Family;Available PRN/intermittently Type of Home: House Home Access: Level entry     Home Layout: One level (1 step from the kitchen) Home Equipment: Bedside commode;Walker - standard      Prior Function Level of Independence: Independent               Hand Dominance   Dominant Hand: Right    Extremity/Trunk Assessment   Upper Extremity Assessment: Defer to OT evaluation           Lower Extremity Assessment: Overall WFL for tasks assessed         Communication   Communication: No difficulties  Cognition Arousal/Alertness: Awake/alert Behavior During Therapy: WFL for tasks assessed/performed Overall Cognitive Status: Within Functional Limits for tasks assessed                      General Comments      Exercises        Assessment/Plan    PT Assessment Patent does not need any further PT services  PT Diagnosis Difficulty walking   PT Problem List    PT Treatment Interventions     PT Goals (Current  goals can be found in the Care Plan section) Acute Rehab PT Goals Patient Stated Goal: to go home and get my manicure with my granddaughter PT Goal Formulation: With patient Time For Goal Achievement: 09/26/15 Potential to Achieve Goals: Good    Frequency     Barriers to discharge        Co-evaluation               End of Session Equipment Utilized During Treatment: Gait belt Activity Tolerance: Patient tolerated treatment well Patient left:  in chair;with call bell/phone within reach;with family/visitor present Nurse Communication: Mobility status    Functional Assessment Tool Used: clinical judgment Functional Limitation: Mobility: Walking and moving around Mobility: Walking and Moving Around Current Status [id number]): At least 1 percent but less than 20 percent impaired, limited or restricted Mobility: Walking and Moving Around Goal Status [phone]): At least 1 percent but less than 20 percent impaired, limited or restricted Mobility: Walking and Moving Around Discharge Status [phone]): At least 1 percent but less than 20 percent impaired, limited or restricted    Time: 0952-1013 PT Time Calculation (min) (ACUTE ONLY): 21 min   Charges:   PT Evaluation $PT Eval Moderate Complexity: 1 Procedure     PT G Codes:   PT G-Codes **NOT FOR INPATIENT CLASS** Functional Assessment Tool Used: clinical judgment Functional Limitation: Mobility: Walking and moving around Mobility: Walking and Moving Around Current Status [id number]): At least 1 percent but less than 20 percent impaired, limited or restricted Mobility: Walking and Moving Around Goal Status [phone]): At least 1 percent but less than 20 percent impaired, limited or restricted Mobility: Walking and Moving Around Discharge Status [phone]): At least 1 percent but less than 20 percent impaired, limited or restricted    Jodi Criscuolo A Sandrea Boer 09/12/2015, 12:05 PM Wray Kearns, [place], DPT [phone]

## 2015-09-12 NOTE — Care Management Obs Status (Signed)
[place] NOTIFICATION   Patient Details  Name: Melissa Ross MRN: [id number] Date of Birth: 1931-04-13   Medicare Observation Status Notification Given:  Yes    Dawayne Patricia, RN 09/12/2015, 2:52 PM

## 2015-09-12 NOTE — Progress Notes (Signed)
Spoke with patient. It was reported in error that she was ETOH. Pt's husband has the actual drinking problem.

## 2015-09-12 NOTE — Evaluation (Signed)
Occupational Therapy Evaluation Patient Details Name: Melissa Ross MRN: [id number] DOB: October 07, 1930 Today's Date: 09/12/2015    History of Present Illness Patient is a 82 y/o female with hx of HTN, MI, CAD, chronic LE pain and respiratory failure presents with episode of passing out. Workup pending.   Clinical Impression   Pt is functioning independently in ADL. No OT needs.    Follow Up Recommendations  No OT follow up    Equipment Recommendations  None recommended by OT    Recommendations for Other Services       Precautions / Restrictions Precautions Precautions: None Restrictions Weight Bearing Restrictions: No      Mobility Bed Mobility               General bed mobility comments: pt in chair  Transfers Overall transfer level: Modified independent Equipment used: None   Sit to Stand: Modified independent (Device/Increase time)         General transfer comment: slow to rise, no assistance    Balance Overall balance assessment: Needs assistance Sitting-balance support: Feet supported;No upper extremity supported Sitting balance-Leahy Scale: Normal     Standing balance support: During functional activity Standing balance-Leahy Scale: Good                              ADL Overall ADL's : Independent                                             Vision     Perception     Praxis      Pertinent Vitals/Pain Pain Assessment: No/denies pain     Hand Dominance Right   Extremity/Trunk Assessment Upper Extremity Assessment Upper Extremity Assessment: Overall WFL for tasks assessed   Lower Extremity Assessment Lower Extremity Assessment: Overall WFL for tasks assessed   Cervical / Trunk Assessment Cervical / Trunk Assessment: Normal   Communication Communication Communication: No difficulties   Cognition Arousal/Alertness: Awake/alert Behavior During Therapy: WFL for tasks assessed/performed Overall  Cognitive Status: Within Functional Limits for tasks assessed                     General Comments       Exercises       Shoulder Instructions      Home Living Family/patient expects to be discharged to:: Private residence Living Arrangements: Alone Available Help at Discharge: Family;Available PRN/intermittently Type of Home: House Home Access: Level entry     Home Layout: One level (1 step to kitchen)     Bathroom Shower/Tub: Occupational psychologist: Standard     Home Equipment: Bedside commode;Walker - standard;Shower seat          Prior Functioning/Environment Level of Independence: Independent             OT Diagnosis: Generalized weakness   OT Problem List:     OT Treatment/Interventions:      OT Goals(Current goals can be found in the care plan section) Acute Rehab OT Goals Patient Stated Goal: to go home and get my manicure with my granddaughter  OT Frequency:     Barriers to D/C:            Co-evaluation              End of Session  Activity Tolerance: Patient tolerated treatment well Patient left: in chair;with call bell/phone within reach;with family/visitor present   Time: [id number] OT Time Calculation (min): 27 min Charges:  OT General Charges $OT Visit: 1 Procedure OT Evaluation $OT Eval Low Complexity: 1 Procedure G-Codes: OT G-codes **NOT FOR INPATIENT CLASS** Functional Assessment Tool Used: clinical judgment Functional Limitation: Self care Self Care Current Status [id number]): 0 percent impaired, limited or restricted Self Care Goal Status [id number]): 0 percent impaired, limited or restricted Self Care Discharge Status [id number]): 0 percent impaired, limited or restricted  Melissa Ross 09/12/2015, 4:02 PM

## 2015-09-12 NOTE — Progress Notes (Signed)
Orthostatics done, put in flow sheets
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
## 2015-09-12 NOTE — Progress Notes (Signed)
VASCULAR LAB PRELIMINARY  PRELIMINARY  PRELIMINARY  PRELIMINARY   ARTERIAL  ABI completed:Bilaterally-ABI's is suggestive mild arterial disease at rest.    RIGHT    LEFT    PRESSURE WAVEFORM  PRESSURE WAVEFORM  BRACHIAL 117 Triphasic BRACHIAL 131 Triphasic  DP 106 Triphasic DP 116 Triphasic  PT 98 Triphasic PT 118 Triphasic    RIGHT LEFT  ABI 0.82 .90     Melissa Ross, RVT, RDMS

## 2015-09-13 ENCOUNTER — Observation Stay (HOSPITAL_COMMUNITY): Payer: Medicare Other

## 2015-09-13 ENCOUNTER — Other Ambulatory Visit: Payer: Self-pay

## 2015-09-13 DIAGNOSIS — G8929 Other chronic pain: Secondary | ICD-10-CM | POA: Diagnosis not present

## 2015-09-13 DIAGNOSIS — J40 Bronchitis, not specified as acute or chronic: Secondary | ICD-10-CM | POA: Diagnosis not present

## 2015-09-13 DIAGNOSIS — R55 Syncope and collapse: Secondary | ICD-10-CM | POA: Diagnosis not present

## 2015-09-13 DIAGNOSIS — M79606 Pain in leg, unspecified: Secondary | ICD-10-CM | POA: Diagnosis not present

## 2015-09-13 DIAGNOSIS — I2510 Atherosclerotic heart disease of native coronary artery without angina pectoris: Secondary | ICD-10-CM | POA: Diagnosis not present

## 2015-09-13 DIAGNOSIS — E785 Hyperlipidemia, unspecified: Secondary | ICD-10-CM | POA: Diagnosis not present

## 2015-09-13 LAB — GLUCOSE, CAPILLARY: GLUCOSE-CAPILLARY: 95 mg/dL (ref 65–99)

## 2015-09-13 LAB — CBC WITH DIFFERENTIAL/PLATELET
BASOS PCT: 1 %
Basophils Absolute: 0 10*3/uL (ref 0.0–0.1)
Basophils Absolute: 0.1 10*3/uL (ref 0.0–0.1)
Basophils Relative: 1 %
EOS ABS: 0.2 10*3/uL (ref 0.0–0.7)
EOS PCT: 3 %
Eosinophils Absolute: 0.2 10*3/uL (ref 0.0–0.7)
Eosinophils Relative: 3 %
HCT: 35.9 % — ABNORMAL LOW (ref 36.0–46.0)
HCT: 37.6 % (ref 36.0–46.0)
HEMOGLOBIN: 11.5 g/dL — AB (ref 12.0–15.0)
Hemoglobin: 12.2 g/dL (ref 12.0–15.0)
LYMPHS ABS: 2.4 10*3/uL (ref 0.7–4.0)
LYMPHS ABS: 2.4 10*3/uL (ref 0.7–4.0)
LYMPHS PCT: 36 %
Lymphocytes Relative: 40 %
MCH: 30.6 pg (ref 26.0–34.0)
MCH: 30.7 pg (ref 26.0–34.0)
MCHC: 32 g/dL (ref 30.0–36.0)
MCHC: 32.4 g/dL (ref 30.0–36.0)
MCV: 95.5 fL (ref 78.0–100.0)
MCV: 94.5 fL (ref 78.0–100.0)
MONO ABS: 0.5 10*3/uL (ref 0.1–1.0)
MONOS PCT: 8 %
Monocytes Absolute: 0.5 10*3/uL (ref 0.1–1.0)
Monocytes Relative: 8 %
NEUTROS PCT: 48 %
Neutro Abs: 3 10*3/uL (ref 1.7–7.7)
Neutro Abs: 3.5 10*3/uL (ref 1.7–7.7)
Neutrophils Relative %: 52 %
PLATELETS: 232 10*3/uL (ref 150–400)
Platelets: 199 10*3/uL (ref 150–400)
RBC: 3.76 MIL/uL — AB (ref 3.87–5.11)
RBC: 3.98 MIL/uL (ref 3.87–5.11)
RDW: 14.1 % (ref 11.5–15.5)
RDW: 13.8 % (ref 11.5–15.5)
WBC: 6.1 10*3/uL (ref 4.0–10.5)
WBC: 6.6 10*3/uL (ref 4.0–10.5)

## 2015-09-13 LAB — COMPREHENSIVE METABOLIC PANEL
ALT: 13 U/L — ABNORMAL LOW (ref 14–54)
AST: 21 U/L (ref 15–41)
Albumin: 3.5 g/dL (ref 3.5–5.0)
Alkaline Phosphatase: 118 U/L (ref 38–126)
Anion gap: 10 (ref 5–15)
BUN: 27 mg/dL — ABNORMAL HIGH (ref 6–20)
CHLORIDE: 104 mmol/L (ref 101–111)
CO2: 28 mmol/L (ref 22–32)
CREATININE: 1.19 mg/dL — AB (ref 0.44–1.00)
Calcium: 9.2 mg/dL (ref 8.9–10.3)
GFR, EST AFRICAN AMERICAN: 48 mL/min — AB (ref >60)
GFR, EST NON AFRICAN AMERICAN: 41 mL/min — AB (ref >60)
Glucose, Bld: 161 mg/dL — ABNORMAL HIGH (ref 65–99)
POTASSIUM: 4.5 mmol/L (ref 3.5–5.1)
Sodium: 142 mmol/L (ref 135–145)
TOTAL PROTEIN: 6 g/dL — AB (ref 6.5–8.1)
Total Bilirubin: 0.5 mg/dL (ref 0.3–1.2)

## 2015-09-13 LAB — BASIC METABOLIC PANEL
Anion gap: 8 (ref 5–15)
BUN: 24 mg/dL — ABNORMAL HIGH (ref 6–20)
CHLORIDE: 104 mmol/L (ref 101–111)
CO2: 27 mmol/L (ref 22–32)
Calcium: 9.4 mg/dL (ref 8.9–10.3)
Creatinine, Ser: 1.1 mg/dL — ABNORMAL HIGH (ref 0.44–1.00)
GFR calc non Af Amer: 45 mL/min — ABNORMAL LOW (ref >60)
GFR, EST AFRICAN AMERICAN: 53 mL/min — AB (ref >60)
Glucose, Bld: 113 mg/dL — ABNORMAL HIGH (ref 65–99)
POTASSIUM: 4.7 mmol/L (ref 3.5–5.1)
SODIUM: 139 mmol/L (ref 135–145)

## 2015-09-13 LAB — LACTIC ACID, PLASMA
LACTIC ACID, VENOUS: 1.6 mmol/L (ref 0.5–2.0)
Lactic Acid, Venous: 1.7 mmol/L (ref 0.5–2.0)

## 2015-09-13 LAB — HEMOGLOBIN A1C
HEMOGLOBIN A1C: 5.7 % — AB (ref 4.8–5.6)
Mean Plasma Glucose: 117 mg/dL

## 2015-09-13 LAB — LIPASE, BLOOD: LIPASE: 43 U/L (ref 11–51)

## 2015-09-13 MED ORDER — CARVEDILOL 3.125 MG PO TABS: 3.1250 mg | ORAL_TABLET | Freq: Two times a day (BID) | ORAL | Status: DC

## 2015-09-13 MED ORDER — SODIUM CHLORIDE 0.9 % IV BOLUS (SEPSIS)
1000.0000 mL | Freq: Once | INTRAVENOUS | Status: AC
  Administered 2015-09-13: 1000 mL via INTRAVENOUS

## 2015-09-13 MED ORDER — ENSURE ENLIVE PO LIQD: 237.0000 mL | Freq: Two times a day (BID) | ORAL | Status: DC

## 2015-09-13 NOTE — Progress Notes (Signed)
Pt ambulated up 3 hall and all 2 hall and back with no complaints Will continue to monitor [employer]

## 2015-09-13 NOTE — Progress Notes (Signed)
CM received call from RN requesting I come to pt's room to explain MOON which was given to her by previous CM.  CM explained the MOON; pt expressed appreciation of explanation.  CM encouraged pt to call the 800 number on her MOON if she required additional information; pt states she understands this "now" and no other CM needs were communicated.
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
## 2015-09-15 NOTE — Progress Notes (Deleted)
[facility] Discharge Summary   Patient: Melissa Ross [id number]   PCP: Glenda Chroman., MD DOB: Jul 17, 1930   Date of admission: 09/11/2015   Date of discharge: 09/13/2015    Discharge Diagnoses:  Principal Problem:   Syncopal episodes Active Problems:   HTN (hypertension)   Chronic leg pain   Dyslipidemia   Subacute Bronchitis   CAD (coronary artery disease)   Protein-calorie malnutrition, severe   Recommendations for Outpatient Follow-up:  1. She is follow-up with PCP as well as cardiology   Follow-up Information    Follow up with VYAS,DHRUV B., MD. Schedule an appointment as soon as possible for a visit in 1 week.   Specialty:  Internal Medicine   Contact information:   [place] [address] 336 [id number]       Follow up with Charolette Forward, MD. Call in 3 days.   Specialty:  Cardiology   Why:  to schedule appointment regarding blood pressure medication adjustment   Contact information:   [address] [place]  [address] [phone]       Diet recommendation: Cardiac diet  Activity: The patient is advised to gradually reintroduce usual activities.  Discharge Condition: good  History of present illness: As per the H and P dictated on admission, "Melissa Ross is a 82 y.o. female with Past medical history of coronary artery disease, hypertension, dyslipidemia, chronic leg pain. The patient presents with complaints of passing out episode. Patient mentions that while she was in the shower this morning she suddenly felt nauseated and did not have any vomiting but felt significantly weak and went back to sleep. Patient later on was feeling better and went to bank, but she was feeling dizzy and lightheaded as well as feeling warm. She was diaphoretic there. Patient does not remember the actual event but she remembers that she was feeling weak and laid her head down on the task of the bank teller. As per the history provided from  the EMS patient passed out and hit the head at the desk. [place] teller mentioned that her pulse was low, her blood pressure was low as per EMS as well. At the time of my evaluation here the patient does not have any complains of headache, vision changes, chest pain, shortness of breath, palpitation, nausea, vomiting, diarrhea, constipation, focal deficit, tingling or numbness anywhere. Patient mentions that she was recently asked to increase her Coreg and she has only taken 2 doses of the new dose, before that she has been taking 2 tablets twice a day of her older dose. No other medication change has been reported. She has cough for last 1 month. Has been treated with Z-Pak as well as prednisone as an outpatient. She also has chronic leg pain primarily affecting her in the middle of the night and feels like burning as well as tightness.  The patient is coming from home At her baseline ambulates without any support And is independent for most of her ADL; manages her medication on her own."  Hospital Course:  Summary of her active problems in the hospital is as following. 1. Syncopal episodes The patient is presenting with complaints of syncope. She also had another near syncope event earlier in the morning prior to arrival to ER. Probability of medication induced with increase in dose of her Coreg is more likely. Orthostatics were positive and therefore her ramipril was also discontinued.  We reduce the dose of the Coreg on discharge. Echocardiogram did not  show any acute abnormality. Telemetry did not show any acute arrhythmia or events.  2. Chronic leg pain. ABI unremarkable, 0000000 and folic acid level and are also normal. possibility of sleep apnea or restless leg syndrome cannot be ruled out. We'll prefer outpatient workup for this conditions.  3. Subacute bronchitis. Chest x-ray without any pneumonia. We'll continue to closely monitor.  4. Essential hypertension. Discontinue ramipril  due to orthostasis Continue lower dose of Coreg on discharge. Patient is more likely to have white coat syndrome.  5. Coronary artery disease. EKG does not show any evidence of acute ischemia. Continue aspirin and Plavix Serial troponins were negative  6. Nausea without vomiting. Etiology is unclear, but patient only had one episode and was able to tolerate diet after that. She did not have any further episodes of nausea. With resolution of the symptoms it was felt that it might be associated with some food and with no further symptoms and tolerating oral diet patient was felt safe to be discharged   All other chronic medical condition were stable during the hospitalization.  Patient was seen by physical therapy, who recommended no further therapy needs, which was arranged by Education officer, museum and case Freight forwarder. On the day of the discharge the patient's oral intake was adequate, did not have any vomiting further, ambulating well, and no other acute medical condition were reported by patient. the patient was felt safe to be discharge at home with therapy.  Procedures and Results:  Echocardiogram  - Procedure narrative: Transthoracic echocardiography. Image quality was adequate. The study was technically difficult. - Left ventricle: The cavity size was normal. Systolic function was normal. The estimated ejection fraction was in the range of 50% to 55%. Wall motion was normal; there were no regional wall motion abnormalities. Doppler parameters are consistent with abnormal left ventricular relaxation (grade 1 diastolic dysfunction). - Atrial septum: No defect or patent foramen ovale was identified. - Pulmonary arteries: Systolic pressure was mildly increased. PA peak pressure: 35 mm Hg (S).  Consultations:  none  DISCHARGE MEDICATION: Discharge Medication List as of 09/13/2015  1:57 PM    START taking these medications   Details  feeding supplement, ENSURE ENLIVE, (ENSURE  ENLIVE) LIQD Take 237 mLs by mouth 2 (two) times daily between meals., Starting 09/13/2015, Until Discontinued, Normal      CONTINUE these medications which have CHANGED   Details  carvedilol (COREG) 3.125 MG tablet Take 1 tablet (3.125 mg total) by mouth 2 (two) times daily., Starting 09/13/2015, Until Discontinued, Normal      CONTINUE these medications which have NOT CHANGED   Details  albuterol (PROAIR HFA) 108 (90 Base) MCG/ACT inhaler Inhale 1-2 puffs into the lungs every 6 (six) hours as needed for wheezing or shortness of breath. , Starting 05/22/2015, Until Discontinued, Historical Med    Ascorbic Acid (VITAMIN C PO) Take 4-5 tablets by mouth daily as needed (cold)., Until Discontinued, Historical Med    aspirin 81 MG chewable tablet Chew 1 tablet (81 mg total) by mouth daily., Starting 03/20/2013, Until Discontinued, Print    atorvastatin (LIPITOR) 10 MG tablet Take 5 mg by mouth daily., Starting 09/02/2015, Until Discontinued, Historical Med    BREO ELLIPTA 100-25 MCG/INH AEPB Inhale 1-2 puffs into the lungs every 6 (six) hours as needed (shortness of breath). , Starting 06/17/2015, Until Discontinued, Historical Med    clopidogrel (PLAVIX) 75 MG tablet Take 1 tablet (75 mg total) by mouth daily., Starting 03/20/2013, Until Discontinued, Print    famotidine (  PEPCID) 20 MG tablet Take 1 tablet (20 mg total) by mouth 2 (two) times daily., Starting 03/20/2013, Until Discontinued, Print    nitroGLYCERIN (NITROSTAT) 0.4 MG SL tablet Place 1 tablet (0.4 mg total) under the tongue every 5 (five) minutes x 3 doses as needed for chest pain., Starting 03/20/2013, Until Discontinued, Print      STOP taking these medications     ramipril (ALTACE) 10 MG capsule      sodium phosphates (OSMOPREP) 1.102-0.398 g TABS tablet        Allergies  Allergen Reactions  . Neosporin [Neomycin-Bacitracin Zn-Polymyx] Other (See Comments)    blisters  . Penicillins Swelling and Rash    Has patient had a PCN  reaction causing immediate rash, facial/tongue/throat swelling, SOB or lightheadedness with hypotension:unknown Has patient had a PCN reaction causing severe rash involving mucus membranes or skin necrosis: unknown Has patient had a PCN reaction that required hospitalization: unknown Has patient had a PCN reaction occurring within the last 10 years: unknown If all of the above answers are "NO", then may proceed with Cephalosporin use.     Discharge Instructions    Diet - low sodium heart healthy    Complete by:  As directed      Increase activity slowly    Complete by:  As directed           Discharge Exam: Filed Weights   09/11/15 1550 09/11/15 2035 09/12/15 0421  Weight: 81.647 kg (180 lb) 81.3 kg (179 lb 3.7 oz) 79.7 kg (175 lb 11.3 oz)   Filed Vitals:   09/12/15 1936 09/13/15 0545  BP: 143/49 150/71  Pulse: 74 80  Temp: 98 F (36.7 C) 97.9 F (36.6 C)  Resp: 18 18   General: Appear in no distress, no Rash; Oral Mucosa moist. Cardiovascular: S1 and S2 Present, no Murmur, no JVD Respiratory: Bilateral Air entry present and Clear to Auscultation, no Crackles, no wheezes Abdomen: Bowel Sound present, Soft and no tenderness Extremities: no Pedal edema, n calf tenderness Neurology: Grossly no focal neuro deficit.  The results of significant diagnostics from this hospitalization (including imaging, microbiology, ancillary and laboratory) are listed below for reference.    Significant Diagnostic Studies: Dg Chest 2 View  09/11/2015  CLINICAL DATA:  Syncope today, cold like symptoms EXAM: CHEST  2 VIEW COMPARISON:  07/16/2015 FINDINGS: Cardiomediastinal silhouette is stable. No acute infiltrate or pleural effusion. No pulmonary edema. Minimal perihilar increased bronchial markings without focal consolidation. Thoracic spine osteopenia. Mild degenerative changes mid thoracic spine. IMPRESSION: No infiltrate or pulmonary edema. Minimal perihilar increased bronchial markings without  focal consolidation. Thoracic spine osteopenia. Electronically Signed   By: Lahoma Crocker M.D.   On: 09/11/2015 16:27   Ct Head Wo Contrast  09/11/2015  CLINICAL DATA:  Nausea and diaphoresis well at the bank, hypotensive. Fall. EXAM: CT HEAD WITHOUT CONTRAST CT CERVICAL SPINE WITHOUT CONTRAST TECHNIQUE: Multidetector CT imaging of the head and cervical spine was performed following the standard protocol without intravenous contrast. Multiplanar CT image reconstructions of the cervical spine were also generated. COMPARISON:  03/07/2015 and 03/18/2013 FINDINGS: CT HEAD FINDINGS Partially empty sella. Otherwise, The brainstem, cerebellum, cerebral peduncles, thalami, basal ganglia, basilar cisterns, and ventricular system appear within normal limits. Periventricular white matter and corona radiata hypodensities favor chronic ischemic microvascular white matter disease. No intracranial hemorrhage, mass lesion, or acute CVA. Mild chronic ethmoid sinusitis. CT CERVICAL SPINE FINDINGS Spurring at the anterior C1-2 articulation. No cervical spine fracture or acute subluxation  identified. Mild left foraminal stenosis due to uncinate spurring at C5-6. Mild central narrowing of the thecal sac at C3-4 due to a central disc protrusion. IMPRESSION: 1. No acute intracranial findings. No acute cervical spine fracture or subluxation. 2. Partially empty sella. 3. Mild chronic ethmoid sinusitis. 4. Mild left foraminal stenosis at C5-6 due to uncinate spurring 5. Mild central narrowing of the thecal sac at C3-4 due to a central disc protrusion. Electronically Signed   By: Van Clines M.D.   On: 09/11/2015 16:52   Ct Cervical Spine Wo Contrast  09/11/2015  CLINICAL DATA:  Nausea and diaphoresis well at the bank, hypotensive. Fall. EXAM: CT HEAD WITHOUT CONTRAST CT CERVICAL SPINE WITHOUT CONTRAST TECHNIQUE: Multidetector CT imaging of the head and cervical spine was performed following the standard protocol without intravenous  contrast. Multiplanar CT image reconstructions of the cervical spine were also generated. COMPARISON:  03/07/2015 and 03/18/2013 FINDINGS: CT HEAD FINDINGS Partially empty sella. Otherwise, The brainstem, cerebellum, cerebral peduncles, thalami, basal ganglia, basilar cisterns, and ventricular system appear within normal limits. Periventricular white matter and corona radiata hypodensities favor chronic ischemic microvascular white matter disease. No intracranial hemorrhage, mass lesion, or acute CVA. Mild chronic ethmoid sinusitis. CT CERVICAL SPINE FINDINGS Spurring at the anterior C1-2 articulation. No cervical spine fracture or acute subluxation identified. Mild left foraminal stenosis due to uncinate spurring at C5-6. Mild central narrowing of the thecal sac at C3-4 due to a central disc protrusion. IMPRESSION: 1. No acute intracranial findings. No acute cervical spine fracture or subluxation. 2. Partially empty sella. 3. Mild chronic ethmoid sinusitis. 4. Mild left foraminal stenosis at C5-6 due to uncinate spurring 5. Mild central narrowing of the thecal sac at C3-4 due to a central disc protrusion. Electronically Signed   By: Van Clines M.D.   On: 09/11/2015 16:52   Dg Abd Portable 1v  09/13/2015  CLINICAL DATA:  Pain and nausea.  Stated mass in colon. EXAM: PORTABLE ABDOMEN - 1 VIEW COMPARISON:  July 17, 2015 FINDINGS: No free air, portal venous gas, or pneumatosis. Mild fecal loading in the colon. No bowel distention to suggest obstruction. Sclerosis and trabecular thickening in the right hemipelvis is unchanged, likely Paget's disease. No other bony changes identified. No other acute abnormalities. IMPRESSION: 1. No acute abdominal abnormalities. 2. 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 Electronically Signed   By: Dorise Bullion III M.D   On: 09/13/2015 15:23   Microbiology: No results found for this or any previous visit (from the past 240 hour(s)).   Labs: CBC:  Recent  Labs Lab 09/11/15 1720 09/12/15 0044 09/13/15 0436 09/13/15 1441  WBC 8.0 7.0 6.1 6.6  NEUTROABS  --   --  3.0 3.5  HGB 11.9* 11.0* 11.5* 12.2  HCT 37.6 34.7* 35.9* 37.6  MCV 94.9 95.3 95.5 94.5  PLT 221 218 199 [id number]   Basic Metabolic Panel:  Recent Labs Lab 09/11/15 1720 09/12/15 0044 09/13/15 0436 09/13/15 1441  NA 140 140 139 142  K 4.8 4.7 4.7 4.5  CL 104 104 104 104  CO2 28 26 27 28   GLUCOSE 132* 143* 113* 161*  BUN 28* 25* 24* 27*  CREATININE 1.17* 1.04* 1.10* 1.19*  CALCIUM 9.2 9.0 9.4 9.2   Liver Function Tests:  Recent Labs Lab 09/12/15 0044 09/13/15 1441  AST 15 21  ALT 10* 13*  ALKPHOS 106 118  BILITOT 0.6 0.5  PROT 5.1* 6.0*  ALBUMIN 3.1* 3.5    Recent Labs Lab 09/13/15  1441  LIPASE 43   No results for input(s): AMMONIA in the last 168 hours. Cardiac Enzymes:  Recent Labs Lab 09/11/15 1947 09/12/15 0044 09/12/15 0559  TROPONINI <0.03 <0.03 <0.03   BNP (last 3 results)  Recent Labs  09/11/15 1947  BNP 25.9   CBG:  Recent Labs Lab 09/12/15 0706 09/13/15 0623  GLUCAP 101* 95   Time spent: 30 minutes  Signed:  Cordarrell Sane  [facility] 09/13/2015 , 1:52 PM

## 2015-09-22 ENCOUNTER — Encounter (INDEPENDENT_AMBULATORY_CARE_PROVIDER_SITE_OTHER): Payer: Self-pay | Admitting: *Deleted

## 2015-09-22 ENCOUNTER — Other Ambulatory Visit (INDEPENDENT_AMBULATORY_CARE_PROVIDER_SITE_OTHER): Payer: Self-pay | Admitting: *Deleted

## 2015-09-22 NOTE — Telephone Encounter (Signed)
Patient needs trilyte 

## 2015-09-23 MED ORDER — PEG 3350-KCL-NA BICARB-NACL 420 G PO SOLR: 4000.0000 mL | Freq: Once | ORAL | Status: DC

## 2015-10-30 ENCOUNTER — Ambulatory Visit (HOSPITAL_COMMUNITY)
Admission: RE | Admit: 2015-10-30 | Discharge: 2015-10-30 | Disposition: A | Discharge status: Home / Self Care | Payer: Medicare Other | Source: Ambulatory Visit | Attending: Internal Medicine | Admitting: Internal Medicine

## 2015-10-30 ENCOUNTER — Encounter (HOSPITAL_COMMUNITY): Payer: Self-pay | Admitting: *Deleted

## 2015-10-30 ENCOUNTER — Encounter (HOSPITAL_COMMUNITY)
Admission: RE | Disposition: A | Discharge status: Home / Self Care | Payer: Self-pay | Source: Ambulatory Visit | Attending: Internal Medicine

## 2015-10-30 DIAGNOSIS — Z538 Procedure and treatment not carried out for other reasons: Secondary | ICD-10-CM

## 2015-10-30 DIAGNOSIS — K219 Gastro-esophageal reflux disease without esophagitis: Secondary | ICD-10-CM | POA: Insufficient documentation

## 2015-10-30 DIAGNOSIS — Z7982 Long term (current) use of aspirin: Secondary | ICD-10-CM | POA: Insufficient documentation

## 2015-10-30 DIAGNOSIS — Q438 Other specified congenital malformations of intestine: Secondary | ICD-10-CM | POA: Insufficient documentation

## 2015-10-30 DIAGNOSIS — Z1211 Encounter for screening for malignant neoplasm of colon: Secondary | ICD-10-CM

## 2015-10-30 DIAGNOSIS — D125 Benign neoplasm of sigmoid colon: Secondary | ICD-10-CM | POA: Insufficient documentation

## 2015-10-30 DIAGNOSIS — Z79899 Other long term (current) drug therapy: Secondary | ICD-10-CM | POA: Diagnosis not present

## 2015-10-30 DIAGNOSIS — M1991 Primary osteoarthritis, unspecified site: Secondary | ICD-10-CM | POA: Insufficient documentation

## 2015-10-30 DIAGNOSIS — I10 Essential (primary) hypertension: Secondary | ICD-10-CM | POA: Insufficient documentation

## 2015-10-30 DIAGNOSIS — K6289 Other specified diseases of anus and rectum: Secondary | ICD-10-CM | POA: Diagnosis not present

## 2015-10-30 DIAGNOSIS — I252 Old myocardial infarction: Secondary | ICD-10-CM | POA: Insufficient documentation

## 2015-10-30 DIAGNOSIS — K5730 Diverticulosis of large intestine without perforation or abscess without bleeding: Secondary | ICD-10-CM | POA: Insufficient documentation

## 2015-10-30 DIAGNOSIS — Z7901 Long term (current) use of anticoagulants: Secondary | ICD-10-CM | POA: Insufficient documentation

## 2015-10-30 HISTORY — PX: COLONOSCOPY: SHX5424

## 2015-10-30 SURGERY — COLONOSCOPY
Anesthesia: Moderate Sedation

## 2015-10-30 MED ORDER — MIDAZOLAM HCL 5 MG/5ML IJ SOLN
INTRAMUSCULAR | Status: AC
  Filled 2015-10-30: qty 10

## 2015-10-30 MED ORDER — STERILE WATER FOR IRRIGATION IR SOLN
Status: DC | PRN
  Administered 2015-10-30: 14:00:00

## 2015-10-30 MED ORDER — MEPERIDINE HCL 50 MG/ML IJ SOLN
INTRAMUSCULAR | Status: DC | PRN
  Administered 2015-10-30 (×2): 25 mg via INTRAVENOUS

## 2015-10-30 MED ORDER — ASPIRIN 81 MG PO CHEW: 81.0000 mg | CHEWABLE_TABLET | Freq: Every day | ORAL | Status: DC

## 2015-10-30 MED ORDER — MIDAZOLAM HCL 5 MG/5ML IJ SOLN
INTRAMUSCULAR | Status: DC | PRN
  Administered 2015-10-30 (×5): 1 mg via INTRAVENOUS

## 2015-10-30 MED ORDER — MEPERIDINE HCL 50 MG/ML IJ SOLN
INTRAMUSCULAR | Status: AC
  Filled 2015-10-30: qty 1

## 2015-10-30 MED ORDER — SODIUM CHLORIDE 0.9 % IV SOLN
INTRAVENOUS | Status: DC
  Administered 2015-10-30: 13:00:00 via INTRAVENOUS

## 2015-10-30 NOTE — H&P (Signed)
Melissa Ross is an 82 y.o. female.   Chief Complaint: Patient is here for colonoscopy. HPI: She is 82 year old Caucasian female with chronic constipation. She was recently evaluated by Dr. Woody Seller for abdominal pain and CT revealed diverticulosis and dilated TI with stool. She is on high fiber diet and has MiraLAX she says she is back to having 1-2 formed stools daily. She denies rectal bleeding anorexia weight loss. She has never been screened for CRC. Family History significant for CRC and paternal uncle who was in his 58s or 65s at the time of diagnosis and if to be in his 17s.  Past Medical History  Diagnosis Date  . Hypertension   . GERD (gastroesophageal reflux disease)   . Arthritis   . Ventral hernia   . Asthma   . Myocardial infarction ([place]) 03/2013  . Respiratory failure [facility])     August 2016, requiring intubation    Past Surgical History  Procedure Laterality Date  . Cholecystectomy    . Tubal ligation    . Toe surgery      right foot  . Tonsillectomy    . Appendectomy    . Eye surgery      left KPE  . Cataract extraction w/phaco  11/09/2011    Procedure: CATARACT EXTRACTION PHACO AND INTRAOCULAR LENS PLACEMENT (IOC);  Surgeon: Elta Guadeloupe T. Gershon Crane, MD;  Location: [facility];  Service: Ophthalmology;  Laterality: Right;  CDE 10.01  . Esophagogastroduodenoscopy (egd) with esophageal dilation  05/11/2012    Procedure: ESOPHAGOGASTRODUODENOSCOPY (EGD) WITH ESOPHAGEAL DILATION;  Surgeon: Rogene Houston, MD;  Location: [facility];  Service: Endoscopy;  Laterality: N/A;  . Ventral hernia repair  07/17/2012    Procedure: LAPAROSCOPIC VENTRAL HERNIA;  Surgeon: Gayland Curry, MD,FACS;  Location: [facility];  Service: General;  Laterality: N/A;  . Insertion of mesh  07/17/2012    Procedure: INSERTION OF MESH;  Surgeon: Gayland Curry, MD,FACS;  Location: [facility];  Service: General;  Laterality: N/A;  . Left heart catheterization with coronary angiogram N/A 03/19/2013    Procedure: LEFT HEART  CATHETERIZATION WITH CORONARY ANGIOGRAM;  Surgeon: Clent Demark, MD;  Location[facility] CATH LAB;  Service: Cardiovascular;  Laterality: N/A;    Family History  Problem Relation Age of Onset  . Malignant hyperthermia Neg Hx   . Pseudochol deficiency Neg Hx   . Hypotension Neg Hx   . Anesthesia problems Neg Hx   . Breast cancer    . Ovarian cancer    . Colon cancer     Social History:  reports that she has never smoked. She does not have any smokeless tobacco history on file. She reports that she does not drink alcohol or use illicit drugs.  Allergies:  Allergies  Allergen Reactions  . Neosporin [Neomycin-Bacitracin Zn-Polymyx] Other (See Comments)    blisters  . Penicillins Swelling and Rash    Has patient had a PCN reaction causing immediate rash, facial/tongue/throat swelling, SOB or lightheadedness with hypotension:unknown Has patient had a PCN reaction causing severe rash involving mucus membranes or skin necrosis: unknown Has patient had a PCN reaction that required hospitalization: unknown Has patient had a PCN reaction occurring within the last 10 years: unknown If all of the above answers are "NO", then may proceed with Cephalosporin use.      Medications Prior to Admission  Medication Sig Dispense Refill  . albuterol (PROAIR HFA) 108 (90 Base) MCG/ACT inhaler Inhale 1-2 puffs into the lungs every 6 (six) hours  as needed for wheezing or shortness of breath.     Marland Kitchen aspirin 81 MG chewable tablet Chew 1 tablet (81 mg total) by mouth daily. (Patient taking differently: Chew 81 mg by mouth at bedtime. ) 30 tablet 3  . atorvastatin (LIPITOR) 10 MG tablet Take 5 mg by mouth daily.  2  . BREO ELLIPTA 100-25 MCG/INH AEPB Inhale 1-2 puffs into the lungs every 6 (six) hours as needed (shortness of breath).   4  . carvedilol (COREG) 3.125 MG tablet Take 1 tablet (3.125 mg total) by mouth 2 (two) times daily. 30 tablet 0  . clopidogrel (PLAVIX) 75 MG tablet Take 1 tablet (75 mg total) by  mouth daily. 30 tablet 3  . famotidine (PEPCID) 20 MG tablet Take 1 tablet (20 mg total) by mouth 2 (two) times daily. 60 tablet 3  . polyethylene glycol-electrolytes (NULYTELY/GOLYTELY) 420 g solution Take 4,000 mLs by mouth once. 4000 mL 0  . Ascorbic Acid (VITAMIN C PO) Take 4-5 tablets by mouth daily as needed (cold).    . feeding supplement, ENSURE ENLIVE, (ENSURE ENLIVE) LIQD Take 237 mLs by mouth 2 (two) times daily between meals. 237 mL 12  . nitroGLYCERIN (NITROSTAT) 0.4 MG SL tablet Place 1 tablet (0.4 mg total) under the tongue every 5 (five) minutes x 3 doses as needed for chest pain. 25 tablet 12    No results found for this or any previous visit (from the past 48 hour(s)). No results found.  ROS  Blood pressure 147/74, pulse 80, temperature 98.8 F (37.1 C), temperature source Oral, resp. rate 20, height 5\' 6"  (1.676 m), weight 180 lb (81.647 kg), SpO2 98 %. Physical Exam  Constitutional: She appears well-developed and well-nourished.  HENT:  Mouth/Throat: Oropharynx is clear and moist.  Eyes: Conjunctivae are normal. No scleral icterus.  Neck: No thyromegaly present.  Cardiovascular: Normal rate, regular rhythm and normal heart sounds.   No murmur heard. Respiratory: Effort normal and breath sounds normal.  GI:  Abdomen is full but soft and nontender without organomegaly or masses.  Musculoskeletal: She exhibits no edema.  Lymphadenopathy:    She has no cervical adenopathy.  Neurological: She is alert.  Skin: Skin is warm and dry.     Assessment/Plan Chronic constipation. Colonoscopy primarily for screening purposes(he has never been screened before).  Rogene Houston, MD 10/30/2015, 2:09 PM

## 2015-10-30 NOTE — Op Note (Signed)
[facility] Patient Name: Melissa Ross Procedure Date: 10/30/2015 2:00 PM MRN: [id number] Date of Birth: 01/10/1931 Attending MD: Hildred Laser , MD CSN: [id number] Age: 82 Admit Type: Outpatient Procedure:                Colonoscopy incomplete to hepatic flexure Indications:              Screening for colorectal malignant neoplasm Providers:                Hildred Laser, MD, Rosina Lowenstein, RN, Randa Spike, Technician Referring MD:             Glenda Chroman Medicines:                Meperidine 50 mg IV, Midazolam 5 mg IV Complications:            No immediate complications. Estimated Blood Loss:     Estimated blood loss: none. Procedure:                Pre-Anesthesia Assessment:                           - Prior to the procedure, a History and Physical                            was performed, and patient medications and                            allergies were reviewed. The patient's tolerance of                            previous anesthesia was also reviewed. The risks                            and benefits of the procedure and the sedation                            options and risks were discussed with the patient.                            All questions were answered, and informed consent                            was obtained. Prior Anticoagulants: The patient                            last took aspirin 7 days prior to the procedure.                            ASA Grade Assessment: III - A patient with severe                            systemic disease. After reviewing the risks and  benefits, the patient was deemed in satisfactory                            condition to undergo the procedure.                           After obtaining informed consent, the colonoscope                            was passed under direct vision. Throughout the                            procedure, the patient's blood pressure,  pulse, and                            oxygen saturations were monitored continuously. The                            [id number]) scope was introduced through                            the anus and advanced to the the hepatic flexure.                            The colonoscopy was aborted due to the difficulty                            of the procedure. Changing the patient to a supine                            position, changing the patient to a prone position,                            withdrawing and reinserting the scope,                            straightening and shortening the scope to obtain                            bowel loop reduction, using scope torsion and                            applying abdominal pressure did not allow for the                            successful completion of the procedure. The quality                            of the bowel preparation was adequate to identify                            polyps 6 mm and larger in size. Scope In: 2:23:07 PM Scope Out: 2:45:25 PM Total Procedure Duration: 0 hours 22  minutes 18 seconds  Findings:      The sigmoid colon was significantly redundant.      Multiple small and large-mouthed diverticula were found in the sigmoid       colon, descending colon, splenic flexure and transverse colon.      A 7 mm polyp was found in the sigmoid colon. The polyp was sessile. The       polyp was removed with a hot snare. Resection and retrieval were       complete.      Anal papilla(e) were hypertrophied. Impression:               - The procedure was aborted due to the difficulty                            of the procedure.                           - Redundant colon.                           - Diverticulosis in the sigmoid colon, in the                            descending colon, at the splenic flexure and in the                            transverse colon.                           - One 7 mm polyp in the sigmoid  colon, removed with                            a hot snare. Resected and retrieved.                           - Anal papilla(e) were hypertrophied. Moderate Sedation:      Moderate (conscious) sedation was administered by the endoscopy nurse       and supervised by the endoscopist. The following parameters were       monitored: oxygen saturation, heart rate, blood pressure, CO2       capnography and response to care. Total physician intraservice time was       29 minutes. Recommendation:           - Patient has a contact number available for                            emergencies. The signs and symptoms of potential                            delayed complications were discussed with the                            patient. Return to normal activities tomorrow.                            Written discharge instructions  were provided to the                            patient.                           - High fiber diet today.                           - Continue present medications.                           - Resume aspirin at prior dose in 5 days.                           - Await pathology results.                           - Repeat colonoscopy. Procedure Code(s):        --- Professional ---                           [phone], 52, Colonoscopy, flexible; with removal of                            tumor(s), polyp(s), or other lesion(s) by snare                            technique                           99152, Moderate sedation services provided by the                            same physician or other qualified health care                            professional performing the diagnostic or                            therapeutic service that the sedation supports,                            requiring the presence of an independent trained                            observer to assist in the monitoring of the                            patient's level of consciousness and physiological                             status; initial 15 minutes of intraservice time,                            patient age 34 years or older  99153, Moderate sedation services; each additional                            15 minutes intraservice time Diagnosis Code(s):        --- Professional ---                           Z12.11, Encounter for screening for malignant                            neoplasm of colon                           Z53.8, Procedure and treatment not carried out for                            other reasons                           D12.5, Benign neoplasm of sigmoid colon                           K62.89, Other specified diseases of anus and rectum                           K57.30, Diverticulosis of large intestine without                            perforation or abscess without bleeding                           Q43.8, Other specified congenital malformations of                            intestine CPT copyright 2016 American Medical Association. All rights reserved. The codes documented in this report are preliminary and upon coder review may  be revised to meet current compliance requirements. Hildred Laser, MD Hildred Laser, MD 10/30/2015 3:00:26 PM This report has been signed electronically. Number of Addenda: 0

## 2015-10-30 NOTE — Discharge Instructions (Signed)
Resume aspirin on 11/04/2015. Resume other medications and high fiber diet as before. No driving for 24 hours. Physician will call with biopsy results and further recommendations.    Colonoscopy, Care After These instructions give you information on caring for yourself after your procedure. Your doctor may also give you more specific instructions. Call your doctor if you have any problems or questions after your procedure. HOME CARE  Do not drive for 24 hours.  Do not sign important papers or use machinery for 24 hours.  You may shower.  You may go back to your usual activities, but go slower for the first 24 hours.  Take rest breaks often during the first 24 hours.  Walk around or use warm packs on your belly (abdomen) if you have belly cramping or gas.  Drink enough fluids to keep your pee (urine) clear or pale yellow.  Resume your normal diet. Avoid heavy or fried foods.  Avoid drinking alcohol for 24 hours or as told by your doctor.  Only take medicines as told by your doctor. If a tissue sample (biopsy) was taken during the procedure:   Do not take aspirin or blood thinners for 7 days, or as told by your doctor.  Do not drink alcohol for 7 days, or as told by your doctor.  Eat soft foods for the first 24 hours. GET HELP IF: You still have a small amount of blood in your poop (stool) 2-3 days after the procedure. GET HELP RIGHT AWAY IF:  You have more than a small amount of blood in your poop.  You see clumps of tissue (blood clots) in your poop.  Your belly is puffy (swollen).  You feel sick to your stomach (nauseous) or throw up (vomit).  You have a fever.  You have belly pain that gets worse and medicine does not help. MAKE SURE YOU:  Understand these instructions.  Will watch your condition.  Will get help right away if you are not doing well or get worse.   This information is not intended to replace advice given to you by your health care  provider. Make sure you discuss any questions you have with your health care provider.   Document Released: 07/31/2010 Document Revised: 07/03/2013 Document Reviewed: 03/05/2013 Elsevier Interactive Patient Education 2016 Elsevier Inc.  High-Fiber Diet Fiber, also called dietary fiber, is a type of carbohydrate found in fruits, vegetables, whole grains, and beans. A high-fiber diet can have many health benefits. Your health care provider may recommend a high-fiber diet to help:  Prevent constipation. Fiber can make your bowel movements more regular.  Lower your cholesterol.  Relieve hemorrhoids, uncomplicated diverticulosis, or irritable bowel syndrome.  Prevent overeating as part of a weight-loss plan.  Prevent heart disease, type 2 diabetes, and certain cancers. WHAT IS MY PLAN? The recommended daily intake of fiber includes:  38 grams for men under age 75.  45 grams for men over age 4.  52 grams for women under age 36.  24 grams for women over age 30. You can get the recommended daily intake of dietary fiber by eating a variety of fruits, vegetables, grains, and beans. Your health care provider may also recommend a fiber supplement if it is not possible to get enough fiber through your diet. WHAT DO I NEED TO KNOW ABOUT A HIGH-FIBER DIET?  Fiber supplements have not been widely studied for their effectiveness, so it is better to get fiber through food sources.  Always check the fiber content  on thenutrition facts label of any prepackaged food. Look for foods that contain at least 5 grams of fiber per serving.  Ask your dietitian if you have questions about specific foods that are related to your condition, especially if those foods are not listed in the following section.  Increase your daily fiber consumption gradually. Increasing your intake of dietary fiber too quickly may cause bloating, cramping, or gas.  Drink plenty of water. Water helps you to digest fiber. WHAT  FOODS CAN I EAT? Grains Whole-grain breads. Multigrain cereal. Oats and oatmeal. Brown rice. Barley. Bulgur wheat. Mondamin. Bran muffins. Popcorn. Rye wafer crackers. Vegetables Sweet potatoes. Spinach. Kale. Artichokes. Cabbage. Broccoli. Green peas. Carrots. Squash. Fruits Berries. Pears. Apples. Oranges. Avocados. Prunes and raisins. Dried figs. Meats and Other Protein Sources Navy, kidney, pinto, and soy beans. Split peas. Lentils. Nuts and seeds. Dairy Fiber-fortified yogurt. Beverages Fiber-fortified soy milk. Fiber-fortified orange juice. Other Fiber bars. The items listed above may not be a complete list of recommended foods or beverages. Contact your dietitian for more options. WHAT FOODS ARE NOT RECOMMENDED? Grains White bread. Pasta made with refined flour. White rice. Vegetables Fried potatoes. Canned vegetables. Well-cooked vegetables.  Fruits Fruit juice. Cooked, strained fruit. Meats and Other Protein Sources Fatty cuts of meat. Fried Sales executive or fried fish. Dairy Milk. Yogurt. Cream cheese. Sour cream. Beverages Soft drinks. Other Cakes and pastries. Butter and oils. The items listed above may not be a complete list of foods and beverages to avoid. Contact your dietitian for more information. WHAT ARE SOME TIPS FOR INCLUDING HIGH-FIBER FOODS IN MY DIET?  Eat a wide variety of high-fiber foods.  Make sure that half of all grains consumed each day are whole grains.  Replace breads and cereals made from refined flour or white flour with whole-grain breads and cereals.  Replace white rice with brown rice, bulgur wheat, or millet.  Start the day with a breakfast that is high in fiber, such as a cereal that contains at least 5 grams of fiber per serving.  Use beans in place of meat in soups, salads, or pasta.  Eat high-fiber snacks, such as berries, raw vegetables, nuts, or popcorn.   This information is not intended to replace advice given to you by your  health care provider. Make sure you discuss any questions you have with your health care provider.   Document Released: 06/28/2005 Document Revised: 07/19/2014 Document Reviewed: 12/11/2013 Elsevier Interactive Patient Education 2016 Elsevier Inc.  Colon Polyps Polyps are lumps of extra tissue growing inside the body. Polyps can grow in the large intestine (colon). Most colon polyps are noncancerous (benign). However, some colon polyps can become cancerous over time. Polyps that are larger than a pea may be harmful. To be safe, caregivers remove and test all polyps. CAUSES  Polyps form when mutations in the genes cause your cells to grow and divide even though no more tissue is needed. RISK FACTORS There are a number of risk factors that can increase your chances of getting colon polyps. They include:  Being older than 50 years.  Family history of colon polyps or colon cancer.  Long-term colon diseases, such as colitis or Crohn disease.  Being overweight.  Smoking.  Being inactive.  Drinking too much alcohol. SYMPTOMS  Most small polyps do not cause symptoms. If symptoms are present, they may include:  Blood in the stool. The stool may look dark red or black.  Constipation or diarrhea that lasts longer than 1 week. DIAGNOSIS  People often do not know they have polyps until their caregiver finds them during a regular checkup. Your caregiver can use 4 tests to check for polyps:  Digital rectal exam. The caregiver wears gloves and feels inside the rectum. This test would find polyps only in the rectum.  Barium enema. The caregiver puts a liquid called barium into your rectum before taking X-rays of your colon. Barium makes your colon look white. Polyps are dark, so they are easy to see in the X-ray pictures.  Sigmoidoscopy. A thin, flexible tube (sigmoidoscope) is placed into your rectum. The sigmoidoscope has a light and tiny camera in it. The caregiver uses the sigmoidoscope to  look at the last third of your colon.  Colonoscopy. This test is like sigmoidoscopy, but the caregiver looks at the entire colon. This is the most common method for finding and removing polyps. TREATMENT  Any polyps will be removed during a sigmoidoscopy or colonoscopy. The polyps are then tested for cancer. PREVENTION  To help lower your risk of getting more colon polyps:  Eat plenty of fruits and vegetables. Avoid eating fatty foods.  Do not smoke.  Avoid drinking alcohol.  Exercise every day.  Lose weight if recommended by your caregiver.  Eat plenty of calcium and folate. Foods that are rich in calcium include milk, cheese, and broccoli. Foods that are rich in folate include chickpeas, kidney beans, and spinach. HOME CARE INSTRUCTIONS Keep all follow-up appointments as directed by your caregiver. You may need periodic exams to check for polyps. SEEK MEDICAL CARE IF: You notice bleeding during a bowel movement.   This information is not intended to replace advice given to you by your health care provider. Make sure you discuss any questions you have with your health care provider.   Document Released: 03/24/2004 Document Revised: 07/19/2014 Document Reviewed: 09/07/2011 Elsevier Interactive Patient Education [employer].

## 2015-11-04 ENCOUNTER — Encounter (HOSPITAL_COMMUNITY): Payer: Self-pay | Admitting: Internal Medicine

## 2015-11-11 ENCOUNTER — Other Ambulatory Visit (INDEPENDENT_AMBULATORY_CARE_PROVIDER_SITE_OTHER): Payer: Self-pay | Admitting: Internal Medicine

## 2015-11-11 ENCOUNTER — Encounter (INDEPENDENT_AMBULATORY_CARE_PROVIDER_SITE_OTHER): Payer: Self-pay | Admitting: *Deleted

## 2015-11-11 DIAGNOSIS — Z5309 Procedure and treatment not carried out because of other contraindication: Secondary | ICD-10-CM

## 2015-11-11 DIAGNOSIS — K562 Volvulus: Secondary | ICD-10-CM

## 2015-11-11 DIAGNOSIS — Z86010 Personal history of colonic polyps: Secondary | ICD-10-CM

## 2015-12-01 ENCOUNTER — Ambulatory Visit
Admission: RE | Admit: 2015-12-01 | Discharge: 2015-12-01 | Disposition: A | Discharge status: Home / Self Care | Payer: Medicare Other | Source: Ambulatory Visit | Attending: Internal Medicine | Admitting: Internal Medicine

## 2015-12-01 DIAGNOSIS — Z5309 Procedure and treatment not carried out because of other contraindication: Secondary | ICD-10-CM

## 2015-12-01 DIAGNOSIS — Z86010 Personal history of colonic polyps: Secondary | ICD-10-CM

## 2015-12-01 DIAGNOSIS — K562 Volvulus: Secondary | ICD-10-CM

## 2016-04-06 ENCOUNTER — Encounter (INDEPENDENT_AMBULATORY_CARE_PROVIDER_SITE_OTHER): Payer: Self-pay

## 2016-08-10 ENCOUNTER — Telehealth: Payer: Self-pay | Admitting: Family Medicine

## 2016-08-10 DIAGNOSIS — I10 Essential (primary) hypertension: Secondary | ICD-10-CM

## 2016-08-10 DIAGNOSIS — E785 Hyperlipidemia, unspecified: Secondary | ICD-10-CM

## 2016-08-10 DIAGNOSIS — I2510 Atherosclerotic heart disease of native coronary artery without angina pectoris: Secondary | ICD-10-CM

## 2016-08-10 NOTE — Telephone Encounter (Signed)
Labs ordered  Laroy Apple, MD [facility] 08/10/2016, 3:53 PM

## 2016-08-10 NOTE — Telephone Encounter (Signed)
Please review and advise Pt has appt on 08/20/2016 to est care

## 2016-08-10 NOTE — Telephone Encounter (Signed)
Pt aware of placed orders and recommedations

## 2016-08-16 ENCOUNTER — Other Ambulatory Visit: Payer: Medicare Other

## 2016-08-16 DIAGNOSIS — E785 Hyperlipidemia, unspecified: Secondary | ICD-10-CM

## 2016-08-16 DIAGNOSIS — I10 Essential (primary) hypertension: Secondary | ICD-10-CM

## 2016-08-17 LAB — CBC WITH DIFFERENTIAL/PLATELET
BASOS ABS: 0 10*3/uL (ref 0.0–0.2)
Basos: 1 %
EOS (ABSOLUTE): 0.1 10*3/uL (ref 0.0–0.4)
EOS: 3 %
HEMATOCRIT: 39.8 % (ref 34.0–46.6)
Hemoglobin: 12.3 g/dL (ref 11.1–15.9)
Immature Grans (Abs): 0 10*3/uL (ref 0.0–0.1)
Immature Granulocytes: 0 %
LYMPHS ABS: 1.8 10*3/uL (ref 0.7–3.1)
Lymphs: 37 %
MCH: 30.4 pg (ref 26.6–33.0)
MCHC: 30.9 g/dL — AB (ref 31.5–35.7)
MCV: 98 fL — ABNORMAL HIGH (ref 79–97)
MONOCYTES: 7 %
Monocytes Absolute: 0.4 10*3/uL (ref 0.1–0.9)
Neutrophils Absolute: 2.5 10*3/uL (ref 1.4–7.0)
Neutrophils: 52 %
Platelets: 255 10*3/uL (ref 150–379)
RBC: 4.05 x10E6/uL (ref 3.77–5.28)
RDW: 14.4 % (ref 12.3–15.4)
WBC: 4.8 10*3/uL (ref 3.4–10.8)

## 2016-08-17 LAB — LIPID PANEL
CHOL/HDL RATIO: 3 ratio (ref 0.0–4.4)
CHOLESTEROL TOTAL: 154 mg/dL (ref 100–199)
HDL: 51 mg/dL (ref >39)
LDL CALC: 72 mg/dL (ref 0–99)
TRIGLYCERIDES: 155 mg/dL — AB (ref 0–149)
VLDL Cholesterol Cal: 31 mg/dL (ref 5–40)

## 2016-08-17 LAB — CMP14+EGFR
ALBUMIN: 4.4 g/dL (ref 3.5–4.7)
ALK PHOS: 168 IU/L — AB (ref 39–117)
ALT: 14 IU/L (ref 0–32)
AST: 18 IU/L (ref 0–40)
Albumin/Globulin Ratio: 1.9 (ref 1.2–2.2)
BUN / CREAT RATIO: 21 (ref 12–28)
BUN: 23 mg/dL (ref 8–27)
Bilirubin Total: 0.7 mg/dL (ref 0.0–1.2)
CO2: 25 mmol/L (ref 18–29)
CREATININE: 1.08 mg/dL — AB (ref 0.57–1.00)
Calcium: 9.7 mg/dL (ref 8.7–10.3)
Chloride: 103 mmol/L (ref 96–106)
GFR calc Af Amer: 55 mL/min/{1.73_m2} — ABNORMAL LOW (ref >59)
GFR calc non Af Amer: 48 mL/min/{1.73_m2} — ABNORMAL LOW (ref >59)
GLUCOSE: 113 mg/dL — AB (ref 65–99)
Globulin, Total: 2.3 g/dL (ref 1.5–4.5)
Potassium: 5.5 mmol/L — ABNORMAL HIGH (ref 3.5–5.2)
Sodium: 143 mmol/L (ref 134–144)
TOTAL PROTEIN: 6.7 g/dL (ref 6.0–8.5)

## 2016-08-17 LAB — TSH: TSH: 1.47 u[IU]/mL (ref 0.450–4.500)

## 2016-08-20 ENCOUNTER — Ambulatory Visit (INDEPENDENT_AMBULATORY_CARE_PROVIDER_SITE_OTHER): Payer: Medicare Other | Admitting: Family Medicine

## 2016-08-20 ENCOUNTER — Encounter: Payer: Self-pay | Admitting: Family Medicine

## 2016-08-20 VITALS — BP 111/67 | HR 90 | Temp 97.1°F | Ht 68.0 in | Wt 179.4 lb

## 2016-08-20 DIAGNOSIS — R531 Weakness: Secondary | ICD-10-CM

## 2016-08-20 DIAGNOSIS — I10 Essential (primary) hypertension: Secondary | ICD-10-CM

## 2016-08-20 DIAGNOSIS — E875 Hyperkalemia: Secondary | ICD-10-CM | POA: Diagnosis not present

## 2016-08-20 DIAGNOSIS — I2510 Atherosclerotic heart disease of native coronary artery without angina pectoris: Secondary | ICD-10-CM | POA: Diagnosis not present

## 2016-08-20 MED ORDER — RAMIPRIL 2.5 MG PO CAPS: 2.5000 mg | ORAL_CAPSULE | Freq: Every day | ORAL | 3 refills | Status: DC

## 2016-08-20 NOTE — Progress Notes (Signed)
   HPI  Patient presents today here to establish care  Patient has some complaints about feeling weak whenever she takes her blood pressure medication. Pt often does not take her medication. She states that when her blood pressures 150 she feels better, it's as low as 202 or 334 systolic on room or ramipril 5 mg  She has a history of CAD, STEMI in 03/2013, no stents on cath On asa nd plavix     PMH: Smoking status noted, arthritis, asthma, GERD, hypertension, MI and September 2014, History significant for appendectomy, cholecystectomy, cataract removal, Family history positive for breast cancer, liver cancer in brother, heart disease in mother and father, stroke in mother and father No alcohol or tobacco use-social history ROS: Per HPI  Objective: BP 111/67   Pulse 90   Temp 97.1 F (36.2 C) (Oral)   Ht _0  (1.727 m)   Wt 179 lb 6.4 oz (81.4 kg)   BMI 27.28 kg/m  Gen: NAD, alert, cooperative with exam HEENT: NCAT CV: RRR, good S1/S2, no murmur Resp: CTABL, no wheezes, non-labored Ext: No edema, warm, mild ttp of le over varicose veins, 2+ DP pulses and brisk cap refill of R foot (pt left L shoe on) Neuro: Alert and oriented, No gross deficits Skin:  Two thick calluses on R plantar surface  Assessment and plan:  # Generalized weakness Usual weakness of octogenarian vs deconditioning vs medical cause Hgb WNl, TSH today  # Hypertension pt seems to be sensitive to very low-dose ACE inhibitor, changed her from 5 mg ramipril to 2.5 mg Emphasized compliance and discussing if she cannot tolerate the medication.  # CAD Stable, no chest pain, however does have some exertional dyspnea which is stable Continue ACE inhibitor, aspirin, Plavix, statin   # Elevated serum potassium Very mild, repeat labs, expect normal result, discontinue ACE inhibitor if not improved.   Orders Placed This Encounter  Procedures  . TSH + free T4  . BMP8+EGFR    Meds ordered this  encounter  Medications  . magnesium oxide (MAG-OX) 400 MG tablet    Sig: Take 400 mg by mouth daily.  Marland Kitchen UNABLE TO FIND    Sig: Muscle Cramp  . DISCONTD: ramipril (ALTACE) 5 MG capsule    Sig: Take 1 capsule by mouth daily at 6 (six) AM.  . clopidogrel (PLAVIX) 75 MG tablet    Sig: Take 75 mg by mouth daily.  . ramipril (ALTACE) 2.5 MG capsule    Sig: Take 1 capsule (2.5 mg total) by mouth daily.    Dispense:  90 capsule    Refill:  [facility] [place], MD [place] 08/20/2016, 2:05 PM

## 2016-08-20 NOTE — Patient Instructions (Signed)
Great to see you!  We will call with lab results within 1 week  Lets follow up in 3 months

## 2016-08-21 LAB — BMP8+EGFR
BUN/Creatinine Ratio: 20 (ref 12–28)
BUN: 24 mg/dL (ref 8–27)
CALCIUM: 9.8 mg/dL (ref 8.7–10.3)
CO2: 24 mmol/L (ref 18–29)
CREATININE: 1.23 mg/dL — AB (ref 0.57–1.00)
Chloride: 101 mmol/L (ref 96–106)
GFR calc non Af Amer: 41 mL/min/{1.73_m2} — ABNORMAL LOW (ref >59)
GFR, EST AFRICAN AMERICAN: 47 mL/min/{1.73_m2} — AB (ref >59)
Glucose: 109 mg/dL — ABNORMAL HIGH (ref 65–99)
Potassium: 4.6 mmol/L (ref 3.5–5.2)
Sodium: 141 mmol/L (ref 134–144)

## 2016-08-21 LAB — TSH+FREE T4
FREE T4: 1.05 ng/dL (ref 0.82–1.77)
TSH: 2.13 u[IU]/mL (ref 0.450–4.500)

## 2016-09-23 ENCOUNTER — Telehealth: Payer: Self-pay | Admitting: Family Medicine

## 2016-09-23 NOTE — Telephone Encounter (Signed)
Spoke to pt's grand daughter and explained she isn't on the ROI form so I can't give her any medical information. She voiced understanding and states she will come to the next appt.

## 2016-11-09 IMAGING — CR DG ABD PORTABLE 1V
2 series · 2 of 2 positions shown · non-contrast
Comparison: July 17, 2015

CLINICAL DATA: Pain and nausea.  Stated mass in colon.

EXAM:
PORTABLE ABDOMEN - 1 VIEW

[AP (1 of 2)]
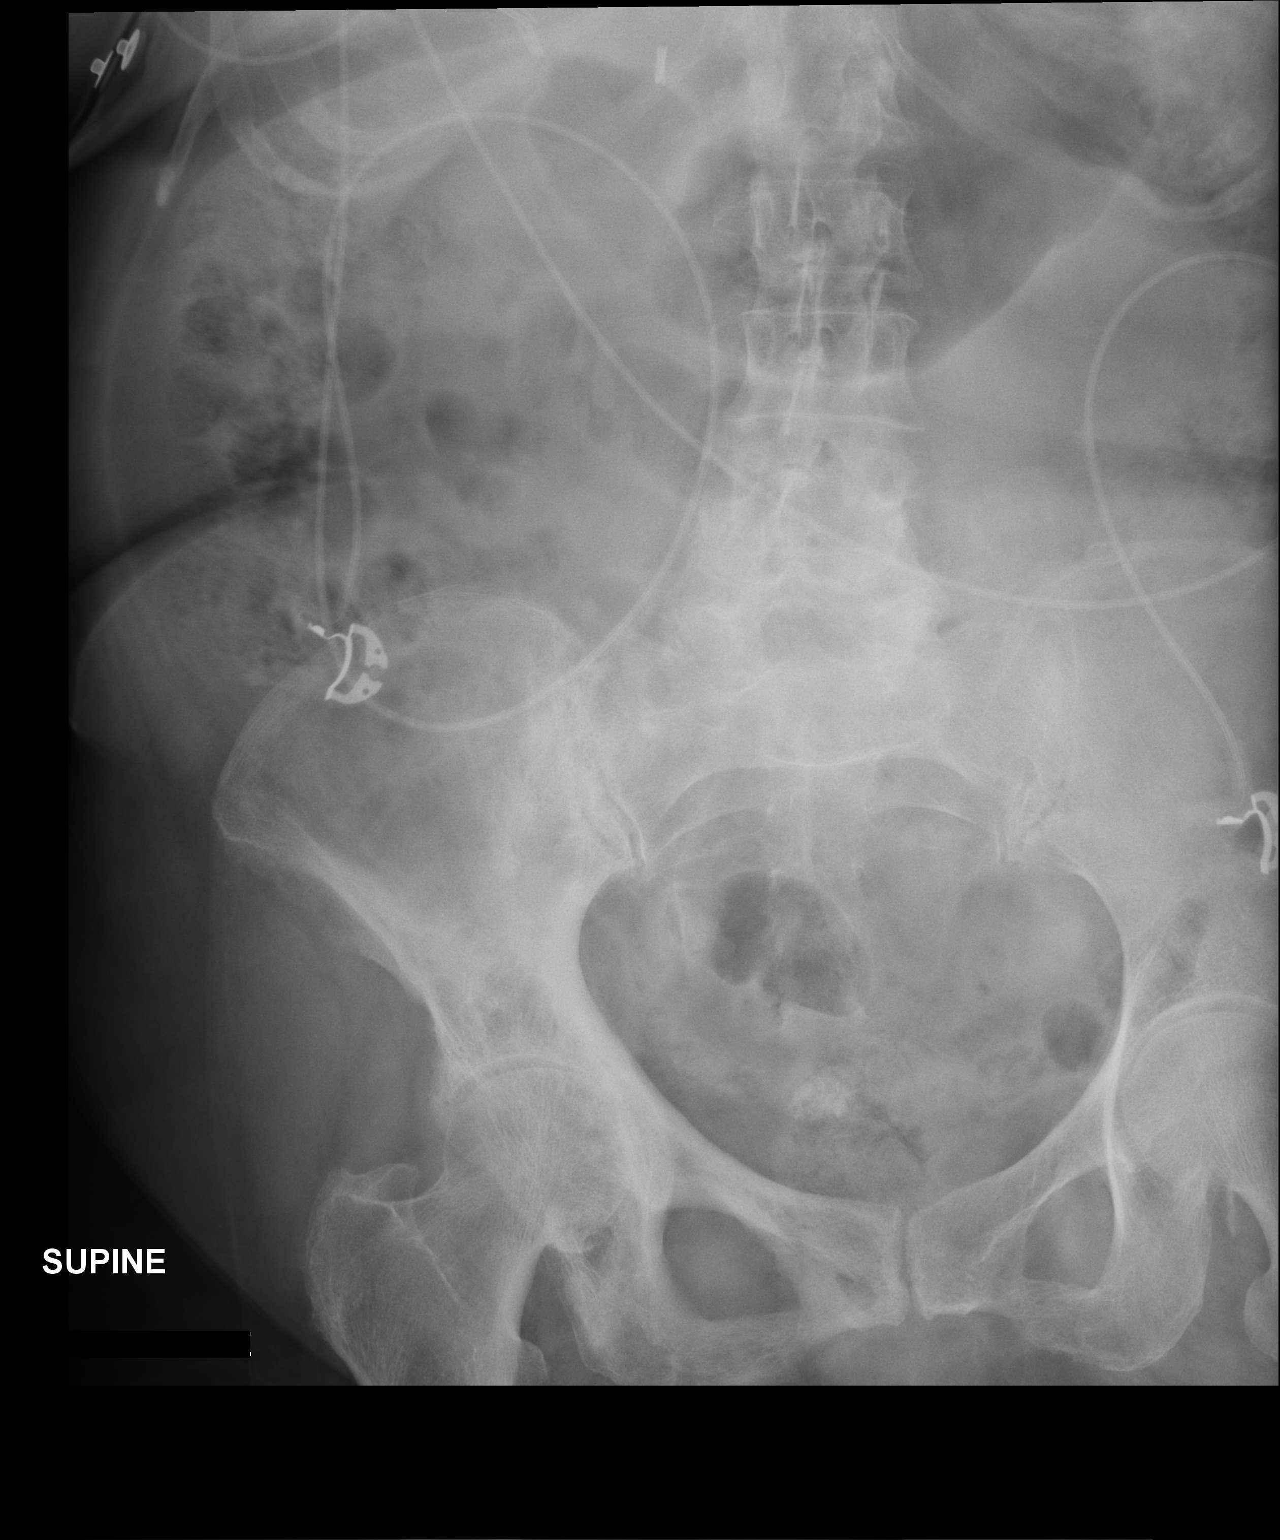

[AP (2 of 2)]
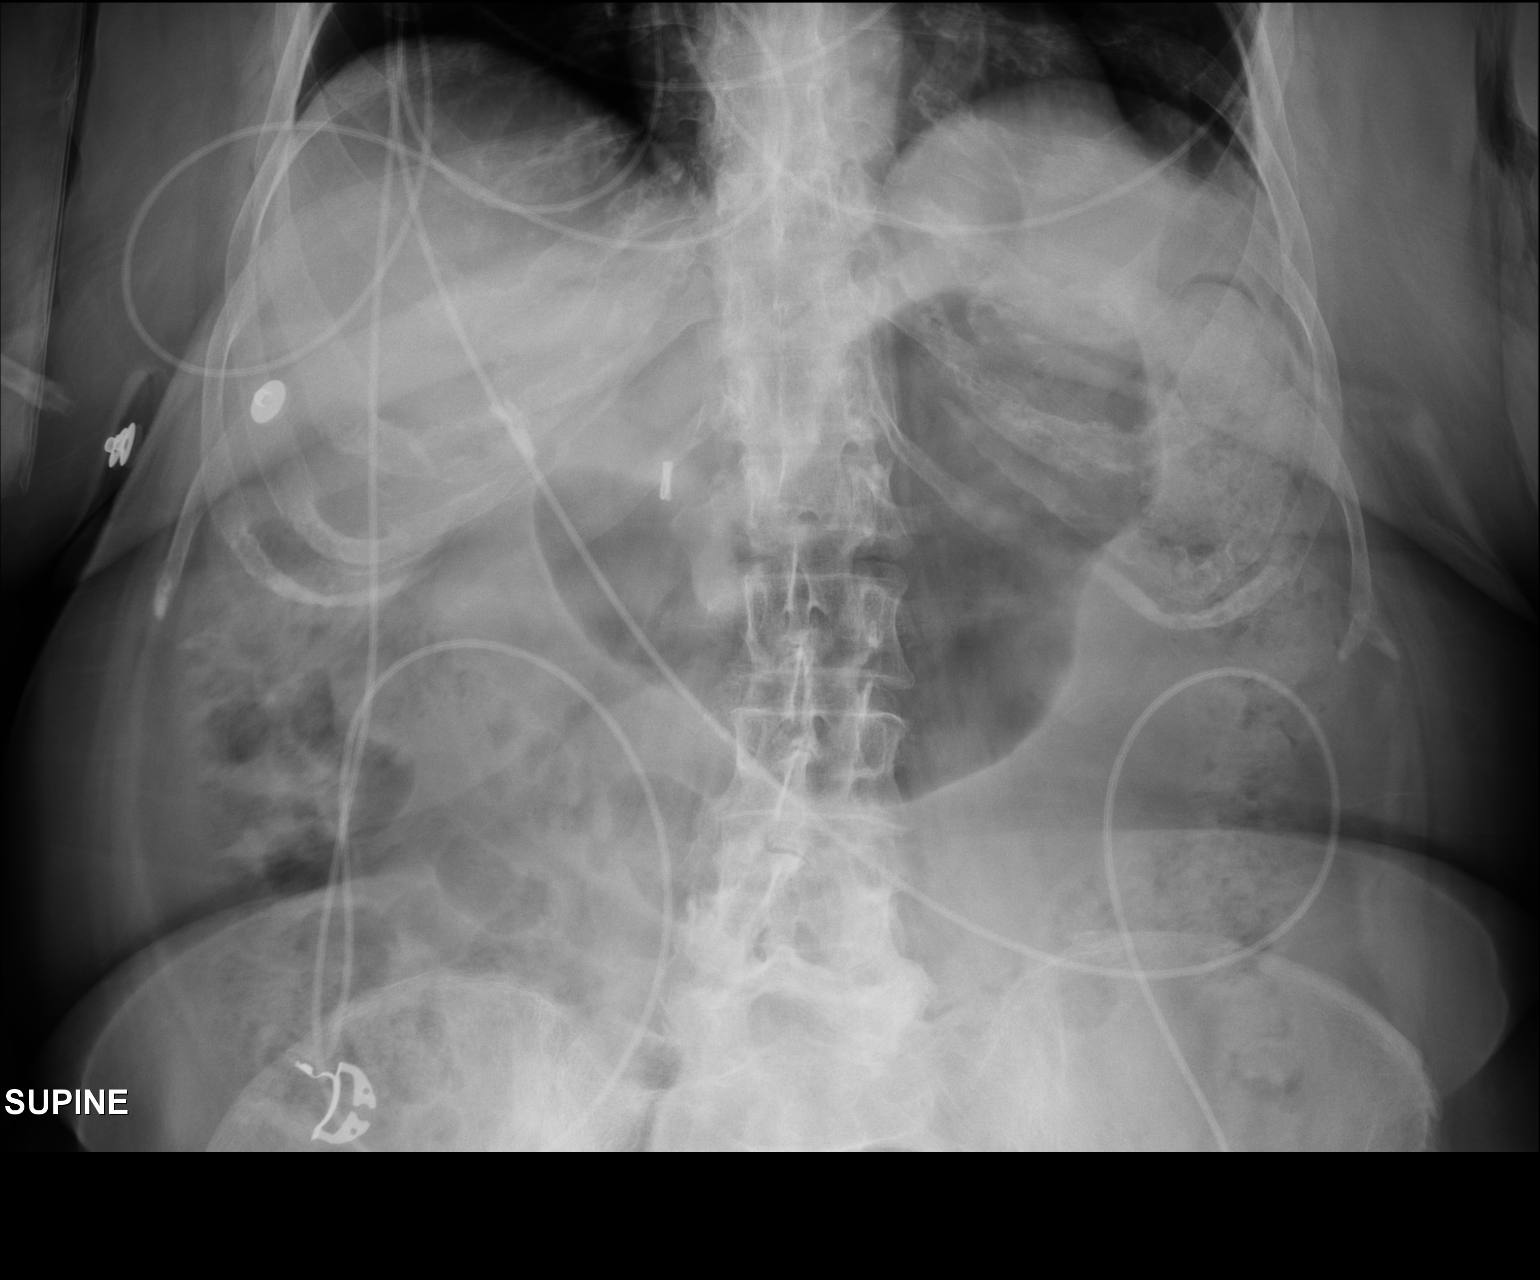

[2 of 2 positions shown; findings below may reference images not displayed]

FINDINGS: No free air, portal venous gas, or pneumatosis. Mild fecal loading
in the colon. No bowel distention to suggest obstruction. Sclerosis
and trabecular thickening in the right hemipelvis is unchanged,
likely Paget's disease. No other bony changes identified. No other
acute abnormalities.
IMPRESSION: 1. No acute abdominal abnormalities.
2. Paget's disease in the right hemipelvis, unchanged.

## 2017-05-11 ENCOUNTER — Encounter: Payer: Self-pay | Admitting: Family Medicine

## 2017-05-11 ENCOUNTER — Ambulatory Visit (INDEPENDENT_AMBULATORY_CARE_PROVIDER_SITE_OTHER): Payer: Medicare Other | Admitting: Family Medicine

## 2017-05-11 VITALS — BP 124/80 | HR 97 | Ht 68.0 in | Wt 181.0 lb

## 2017-05-11 DIAGNOSIS — N3000 Acute cystitis without hematuria: Secondary | ICD-10-CM | POA: Diagnosis not present

## 2017-05-11 DIAGNOSIS — I10 Essential (primary) hypertension: Secondary | ICD-10-CM | POA: Diagnosis not present

## 2017-05-11 LAB — URINALYSIS, COMPLETE
BILIRUBIN UA: NEGATIVE
Glucose, UA: NEGATIVE
NITRITE UA: NEGATIVE
PH UA: 5.5 (ref 5.0–7.5)
RBC UA: NEGATIVE
Specific Gravity, UA: >1.03 — ABNORMAL HIGH (ref 1.005–1.030)
UUROB: 2 mg/dL — AB (ref 0.2–1.0)

## 2017-05-11 LAB — MICROSCOPIC EXAMINATION: RENAL EPITHEL UA: NONE SEEN /HPF

## 2017-05-11 MED ORDER — NITROFURANTOIN MONOHYD MACRO 100 MG PO CAPS: 100.0000 mg | ORAL_CAPSULE | Freq: Two times a day (BID) | ORAL | 0 refills | Status: DC

## 2017-05-11 NOTE — Progress Notes (Addendum)
   HPI  Patient presents today here for follow-up hypertension and dysuria.  Dysuria with brown urine and increased urinary frequency for a few days. Denies fever, chills, sweats. She is tolerating food and fluids like usual.  Hypertension Patient has been fine-tuning blood pressure medications with her cardiologist.  She has easy hypotension. Patient taking 12.5 mg losartan once daily. Takes this around 5 AM.  Patient feels that her blood pressure is much higher in the morning than in the evening.  Her blood pressure has been in the low 200s for the last 2 weeks frequently in the morning. No chest pain or headaches.  PMH: Smoking status noted ROS: Per HPI  Objective: BP 124/80 (BP Location: Left Arm, Patient Position: Sitting, Cuff Size: Normal)   Pulse 97   Ht 5\' 8"  (1.727 m)   Wt 181 lb (82.1 kg)   BMI 27.52 kg/m  Gen: NAD, alert, cooperative with exam HEENT: NCAT CV: RRR, good S1/S2, no murmur Resp: CTABL, no wheezes, non-labored Abd: SNTND, BS present, no guarding or organomegaly, no suprapubic tenderness, no CVA tenderness Ext: No edema, warm Neuro: Alert and oriented, No gross deficits  Assessment and plan:  #Hypertension Well-controlled here, however 200s at home.  Her blood pressure cuff was checked and is reasonably accurate. Change losartan from once daily to twice daily, she may be having transient period of relatively low serum concentrations of losartan.   #Dysuria Mild symptoms Urinalysis and culture pending. No signs of urosepsis   Orders Placed This Encounter  Procedures  . Urine Culture  . Urinalysis, Complete    Meds ordered this encounter  Medications  . losartan (COZAAR) 25 MG tablet    Sig: Take 12.5 mg by mouth 2 (two) times daily.    Laroy Apple, MD [facility] 05/11/2017, 2:40 PM  Addendum- UTI Treated with macroobid

## 2017-05-11 NOTE — Patient Instructions (Signed)
Great to see you!  Come back or call with any concerns  Increase losartan to 1/2 pill twice daily.

## 2017-05-11 NOTE — Addendum Note (Signed)
Addended by: Timmothy Euler on: 05/11/2017 03:02 PM   Modules accepted: Orders

## 2017-05-14 LAB — URINE CULTURE

## 2017-06-29 ENCOUNTER — Other Ambulatory Visit: Payer: Self-pay | Admitting: *Deleted

## 2017-06-30 MED ORDER — LOSARTAN POTASSIUM 25 MG PO TABS: 12.5000 mg | ORAL_TABLET | Freq: Two times a day (BID) | ORAL | 0 refills | Status: DC

## 2017-09-27 ENCOUNTER — Encounter: Payer: Self-pay | Admitting: Family Medicine

## 2017-09-27 ENCOUNTER — Ambulatory Visit (INDEPENDENT_AMBULATORY_CARE_PROVIDER_SITE_OTHER): Payer: Medicare Other | Admitting: Family Medicine

## 2017-09-27 ENCOUNTER — Ambulatory Visit (INDEPENDENT_AMBULATORY_CARE_PROVIDER_SITE_OTHER): Payer: Medicare Other

## 2017-09-27 ENCOUNTER — Other Ambulatory Visit: Payer: Self-pay | Admitting: Family Medicine

## 2017-09-27 VITALS — BP 193/90 | HR 90 | Temp 97.5°F | Ht 68.0 in | Wt 184.8 lb

## 2017-09-27 DIAGNOSIS — L814 Other melanin hyperpigmentation: Secondary | ICD-10-CM

## 2017-09-27 DIAGNOSIS — L989 Disorder of the skin and subcutaneous tissue, unspecified: Secondary | ICD-10-CM | POA: Diagnosis not present

## 2017-09-27 DIAGNOSIS — I10 Essential (primary) hypertension: Secondary | ICD-10-CM | POA: Diagnosis not present

## 2017-09-27 DIAGNOSIS — R059 Cough, unspecified: Secondary | ICD-10-CM

## 2017-09-27 DIAGNOSIS — R05 Cough: Secondary | ICD-10-CM

## 2017-09-27 DIAGNOSIS — G629 Polyneuropathy, unspecified: Secondary | ICD-10-CM | POA: Diagnosis not present

## 2017-09-27 LAB — BAYER DCA HB A1C WAIVED: HB A1C (BAYER DCA - WAIVED): 5.3 % (ref <7.0)

## 2017-09-27 MED ORDER — LEVOFLOXACIN 750 MG PO TABS: 750.0000 mg | ORAL_TABLET | ORAL | 0 refills | Status: DC

## 2017-09-27 MED ORDER — LISINOPRIL 10 MG PO TABS: 10.0000 mg | ORAL_TABLET | Freq: Every day | ORAL | 3 refills | Status: DC

## 2017-09-27 NOTE — Progress Notes (Signed)
   HPI  Patient presents today here with multple complaints  SOB and cough Longstanding., worse for 5 days, Hx of asthma Not using albuterol No malaise, not feeling sick No Fevers  R facial lesion for 1+ year, TTP  BL feet numbness and tingling that are burning for 1+ year.   HTN Not taking losartan- makes her feel bad.  Discussed recall  Also c/o of easy bruising on BL feet, denies heavy sun exposure.   PMH: Smoking status noted ROS: Per HPI  Objective: BP (!) 193/90   Pulse 90   Temp (!) 97.5 F (36.4 C) (Oral)   Ht '5\' 8"'$  (1.727 m)   Wt 184 lb 12.8 oz (83.8 kg)   SpO2 99%   BMI 28.10 kg/m  Gen: NAD, alert, cooperative with exam HEENT: NCAT CV: RRR, good S1/S2, no murmur Resp: CTABL, no wheezes, non-labored Ext: No edema, warm Neuro: Alert and oriented, No gross deficits Skin R facial lesion approx 5-10 mm in diameter erythematous and ttp  Foot:  Sensation intact to monofilament throughout except R heel, Duct tape on BL feet - she explains this is for calluses   Assessment and plan:  # cough Hx of asthma, will try albuterol Doubt serious exacerbation CXR pending.   # Skin lesion Likely sqaum- she will see her dermatologist  # Neuropathy BL feet with characteristic symps A1C, CBC ( Consider B12 def) No Hx of DM2  # Solar lentigo Discussed On plavix  HTN Not taking losartan, BP at home  Lisinopril Follow up 3-4 weeks    Orders Placed This Encounter  Procedures  . DG Chest 2 View    Standing Status:   Future    Number of Occurrences:   1    Standing Expiration Date:   11/28/2018    Order Specific Question:   Reason for Exam (SYMPTOM  OR DIAGNOSIS REQUIRED)    Answer:   Cough, eval for CAP    Order Specific Question:   Preferred imaging location?    Answer:   Internal    Order Specific Question:   Radiology Contrast Protocol - do NOT remove file path    Answer:   \\[email or web address].pdf  . CBC with  Differential/Platelet  . CMP14+EGFR  . Bayer DCA Hb A1c Waived    Meds ordered this encounter  Medications  . lisinopril (PRINIVIL,ZESTRIL) 10 MG tablet    Sig: Take 1 tablet (10 mg total) by mouth daily.    Dispense:  90 tablet    Refill:  Williams, MD [place] 09/27/2017, 12:12 PM

## 2017-09-27 NOTE — Progress Notes (Signed)
Patient seen today for shortness of breath, and some concern for infiltrate on x-ray.  Creatinine clearance is 45, treating with Levaquin renally dosed, 750 mg every other day.  Laroy Apple, MD [facility] 09/27/2017, 6:31 PM

## 2017-09-27 NOTE — Patient Instructions (Signed)
Great to see you!  Come back in 3-4 weeks for recheck of your blood pressure  Start lisinopril 1 pill once daily.

## 2017-09-28 ENCOUNTER — Other Ambulatory Visit: Payer: Self-pay

## 2017-09-28 ENCOUNTER — Encounter (HOSPITAL_COMMUNITY): Payer: Self-pay | Admitting: Emergency Medicine

## 2017-09-28 ENCOUNTER — Emergency Department (HOSPITAL_COMMUNITY)
Admission: EM | Admit: 2017-09-28 | Discharge: 2017-09-28 | Disposition: A | Discharge status: Home / Self Care | Payer: Medicare Other | Attending: Emergency Medicine | Admitting: Emergency Medicine

## 2017-09-28 ENCOUNTER — Emergency Department (HOSPITAL_COMMUNITY): Payer: Medicare Other

## 2017-09-28 DIAGNOSIS — J189 Pneumonia, unspecified organism: Secondary | ICD-10-CM | POA: Diagnosis not present

## 2017-09-28 DIAGNOSIS — I2510 Atherosclerotic heart disease of native coronary artery without angina pectoris: Secondary | ICD-10-CM | POA: Diagnosis not present

## 2017-09-28 DIAGNOSIS — Z7982 Long term (current) use of aspirin: Secondary | ICD-10-CM | POA: Diagnosis not present

## 2017-09-28 DIAGNOSIS — Z79899 Other long term (current) drug therapy: Secondary | ICD-10-CM | POA: Diagnosis not present

## 2017-09-28 DIAGNOSIS — R05 Cough: Secondary | ICD-10-CM | POA: Diagnosis present

## 2017-09-28 DIAGNOSIS — J45909 Unspecified asthma, uncomplicated: Secondary | ICD-10-CM | POA: Diagnosis not present

## 2017-09-28 DIAGNOSIS — I10 Essential (primary) hypertension: Secondary | ICD-10-CM | POA: Insufficient documentation

## 2017-09-28 LAB — CMP14+EGFR
A/G RATIO: 1.8 (ref 1.2–2.2)
ALK PHOS: 215 IU/L — AB (ref 39–117)
ALT: 10 IU/L (ref 0–32)
AST: 17 IU/L (ref 0–40)
Albumin: 4.4 g/dL (ref 3.5–4.7)
BILIRUBIN TOTAL: 0.7 mg/dL (ref 0.0–1.2)
BUN/Creatinine Ratio: 19 (ref 12–28)
BUN: 19 mg/dL (ref 8–27)
CHLORIDE: 104 mmol/L (ref 96–106)
CO2: 25 mmol/L (ref 20–29)
Calcium: 9.6 mg/dL (ref 8.7–10.3)
Creatinine, Ser: 0.98 mg/dL (ref 0.57–1.00)
GFR calc non Af Amer: 53 mL/min/{1.73_m2} — ABNORMAL LOW (ref >59)
GFR, EST AFRICAN AMERICAN: 61 mL/min/{1.73_m2} (ref >59)
GLUCOSE: 101 mg/dL — AB (ref 65–99)
Globulin, Total: 2.5 g/dL (ref 1.5–4.5)
POTASSIUM: 4.8 mmol/L (ref 3.5–5.2)
Sodium: 144 mmol/L (ref 134–144)
TOTAL PROTEIN: 6.9 g/dL (ref 6.0–8.5)

## 2017-09-28 LAB — CBC WITH DIFFERENTIAL/PLATELET
BASOS: 1 %
Basophils Absolute: 0.1 10*3/uL (ref 0.0–0.2)
EOS (ABSOLUTE): 0.5 10*3/uL — AB (ref 0.0–0.4)
Eos: 8 %
Hematocrit: 38 % (ref 34.0–46.6)
Hemoglobin: 12.4 g/dL (ref 11.1–15.9)
IMMATURE GRANS (ABS): 0 10*3/uL (ref 0.0–0.1)
Immature Granulocytes: 0 %
LYMPHS: 32 %
Lymphocytes Absolute: 2 10*3/uL (ref 0.7–3.1)
MCH: 30.4 pg (ref 26.6–33.0)
MCHC: 32.6 g/dL (ref 31.5–35.7)
MCV: 93 fL (ref 79–97)
Monocytes Absolute: 0.5 10*3/uL (ref 0.1–0.9)
Monocytes: 8 %
NEUTROS ABS: 3.1 10*3/uL (ref 1.4–7.0)
NEUTROS PCT: 51 %
PLATELETS: 309 10*3/uL (ref 150–379)
RBC: 4.08 x10E6/uL (ref 3.77–5.28)
RDW: 13.6 % (ref 12.3–15.4)
WBC: 6.1 10*3/uL (ref 3.4–10.8)

## 2017-09-28 MED ORDER — PREDNISONE 10 MG PO TABS: 20.0000 mg | ORAL_TABLET | Freq: Every day | ORAL | 0 refills | Status: DC

## 2017-09-28 MED ORDER — IPRATROPIUM-ALBUTEROL 0.5-2.5 (3) MG/3ML IN SOLN
3.0000 mL | Freq: Once | RESPIRATORY_TRACT | Status: AC
  Administered 2017-09-28: 3 mL via RESPIRATORY_TRACT
  Filled 2017-09-28: qty 3

## 2017-09-28 MED ORDER — ALBUTEROL SULFATE HFA 108 (90 BASE) MCG/ACT IN AERS
2.0000 | INHALATION_SPRAY | RESPIRATORY_TRACT | Status: DC | PRN
  Administered 2017-09-28: 2 via RESPIRATORY_TRACT
  Filled 2017-09-28: qty 6.7

## 2017-09-28 MED ORDER — ALBUTEROL SULFATE (2.5 MG/3ML) 0.083% IN NEBU
2.5000 mg | INHALATION_SOLUTION | Freq: Once | RESPIRATORY_TRACT | Status: AC
  Administered 2017-09-28: 2.5 mg via RESPIRATORY_TRACT
  Filled 2017-09-28: qty 3

## 2017-09-28 MED ORDER — PREDNISONE 50 MG PO TABS
60.0000 mg | ORAL_TABLET | Freq: Once | ORAL | Status: AC
  Administered 2017-09-28: 60 mg via ORAL
  Filled 2017-09-28: qty 1

## 2017-09-28 NOTE — ED Provider Notes (Signed)
[facility] EMERGENCY DEPARTMENT Provider Note   CSN: [id number] Arrival date & time: 09/28/17  1015     History   Chief Complaint Chief Complaint  Patient presents with  . Cough    HPI Melissa Ross is a 84 y.o. female.   Cough     Past Medical History:  Diagnosis Date  . Arthritis   . Asthma   . GERD (gastroesophageal reflux disease)   . Hypertension   . Myocardial infarction ([place]) 03/2013  . Respiratory failure [facility])    August 2016, requiring intubation  . Ventral hernia     Patient Active Problem List   Diagnosis Date Noted  . Protein-calorie malnutrition, severe 09/12/2015  . Syncopal episodes 09/11/2015  . Chronic leg pain 09/11/2015  . Dyslipidemia 09/11/2015  . Subacute Bronchitis 09/11/2015  . History of Respiratory failure requiring intubation ([place]) 09/11/2015  . CAD (coronary artery disease) 09/11/2015  . Constipation 08/25/2015  . HTN (hypertension) 07/18/2012    Past Surgical History:  Procedure Laterality Date  . APPENDECTOMY    . CATARACT EXTRACTION W/PHACO  11/09/2011   Procedure: CATARACT EXTRACTION PHACO AND INTRAOCULAR LENS PLACEMENT (IOC);  Surgeon: Elta Guadeloupe T. Gershon Crane, MD;  Location: [facility] ORS;  Service: Ophthalmology;  Laterality: Right;  CDE 10.01  . CHOLECYSTECTOMY    . COLONOSCOPY N/A 10/30/2015   Procedure: COLONOSCOPY;  Surgeon: Rogene Houston, MD;  Location: [facility] ENDO SUITE;  Service: Endoscopy;  Laterality: N/A;  2:20 - moved to 4/20 @ 1:25  . ESOPHAGOGASTRODUODENOSCOPY (EGD) WITH ESOPHAGEAL DILATION  05/11/2012   Procedure: ESOPHAGOGASTRODUODENOSCOPY (EGD) WITH ESOPHAGEAL DILATION;  Surgeon: Rogene Houston, MD;  [facility] ENDO SU[facility];  Service: Endoscopy;  Laterality: N/A;  . EYE SURGERY     left KPE  . INSERTION OF MESH  07/17/2012   Procedure: INSERTION OF MESH;  Surgeon: Gayland Curry, MD,FACS;  Location: [place];  Service: General;  Laterality: N/A;  . LEFT HEART CATHETERIZATION WITH CORONARY ANGIOGRAM N/A 03/19/2013   Procedure:  LEFT HEART CATHETERIZATION WITH CORONARY ANGIOGRAM;  Surgeon: Clent Demark, MD;  Location: [place] CATH LAB;  Service: Cardiovascular;  Laterality: N/A;  . TOE SURGERY     right foot  . TONSILLECTOMY    . TUBAL LIGATION    . VENTRAL HERNIA REPAIR  07/17/2012   Procedure: LAPAROSCOPIC VENTRAL HERNIA;  Surgeon: Gayland Curry, MD,FACS;  Location: [place];  Service: General;  Laterality: N/A;    OB History    Gravida Para Term Preterm AB Living             2   SAB TAB Ectopic Multiple Live Births                   Home Medications    Prior to Admission medications   Medication Sig Start Date End Date Taking? Authorizing Provider  aspirin 81 MG chewable tablet Chew 1 tablet (81 mg total) by mouth daily. 11/04/15  Yes Rehman, Mechele Dawley, MD  atorvastatin (LIPITOR) 10 MG tablet Take 5 mg by mouth daily. 09/02/15  Yes [provider]  clopidogrel (PLAVIX) 75 MG tablet Take 75 mg by mouth daily.   Yes [provider]  famotidine (PEPCID) 20 MG tablet Take 1 tablet (20 mg total) by mouth 2 (two) times daily. Patient taking differently: Take 20 mg by mouth daily.  03/20/13  Yes Charolette Forward, MD  levofloxacin (LEVAQUIN) 750 MG tablet Take 1 tablet (750 mg total) by mouth every other day. 09/27/17  Yes Timmothy Euler, MD  lisinopril (PRINIVIL,ZESTRIL) 10 MG tablet Take 1 tablet (10 mg total) by mouth daily. 09/27/17  Yes Timmothy Euler, MD  nitroGLYCERIN (NITROSTAT) 0.4 MG SL tablet Place 1 tablet (0.4 mg total) under the tongue every 5 (five) minutes x 3 doses as needed for chest pain. 03/20/13  Yes Charolette Forward, MD  UNABLE TO FIND Muscle Cramp   Yes [provider]  predniSONE (DELTASONE) 10 MG tablet Take 2 tablets (20 mg total) by mouth daily. 09/28/17   Milton Ferguson, MD    Family History Family History  Problem Relation Age of Onset  . Heart disease Mother   . Stroke Mother   . Stroke Father   . Heart disease Father   . Hypertension Sister   . Cancer Brother         liver  . Breast cancer Unknown   . Ovarian cancer Unknown   . Colon cancer Unknown   . Malignant hyperthermia Neg Hx   . Pseudochol deficiency Neg Hx   . Hypotension Neg Hx   . Anesthesia problems Neg Hx     Social History Social History   Tobacco Use  . Smoking status: Never Smoker  . Smokeless tobacco: Never Used  Substance Use Topics  . Alcohol use: No  . Drug use: No     Allergies   Neosporin [neomycin-bacitracin zn-polymyx] and Penicillins   Review of Systems Review of Systems  Respiratory: Positive for cough.      Physical Exam Updated Vital Signs BP (!) 189/62   Pulse 99   Temp 98.5 F (36.9 C) (Oral)   Resp 18   Ht 5\' 8"  (1.727 m)   Wt 82.6 kg (182 lb)   SpO2 98%   BMI 27.67 kg/m   Physical Exam   ED Treatments / Results  Labs (all labs ordered are listed, but only abnormal results are displayed) Labs Reviewed - No data to display  EKG  EKG Interpretation None       Radiology Dg Chest 2 View  Result Date: 09/27/2017 CLINICAL DATA:  Cough. EXAM: CHEST - 2 VIEW COMPARISON:  No recent prior. FINDINGS: Mediastinum and hilar structures normal. Heart size normal. Mild left base subsegmental atelectasis/infiltrate. No pleural effusion or pneumothorax. Diffuse osteopenia degenerative change. No acute bony abnormality. IMPRESSION: Mild left base subsegmental atelectasis/infiltrate. Electronically Signed   By: Marcello Moores  Register   On: 09/27/2017 14:03    Procedures Procedures (including critical care time)  Medications Ordered in ED Medications  albuterol (PROVENTIL HFA;VENTOLIN HFA) 108 (90 Base) MCG/ACT inhaler 2 puff (not administered)  ipratropium-albuterol (DUONEB) 0.5-2.5 (3) MG/3ML nebulizer solution 3 mL (3 mLs Nebulization Given 09/28/17 1210)  albuterol (PROVENTIL) (2.5 MG/3ML) 0.083% nebulizer solution 2.5 mg (2.5 mg Nebulization Given 09/28/17 1210)  predniSONE (DELTASONE) tablet 60 mg (60 mg Oral Given 09/28/17 1206)      Initial Impression / Assessment and Plan / ED Course  I have reviewed the triage vital signs and the nursing notes.  Pertinent labs & imaging results that were available during my care of the patient were reviewed by me and considered in my medical decision making (see chart for details).     Patient nontoxic.  She has Levaquin for pneumonia and will be prescribed prednisone and given albuterol inhaler and will follow up with her PCP  Final Clinical Impressions(s) / ED Diagnoses   Final diagnoses:  Community acquired pneumonia, unspecified laterality    ED Discharge Orders  Ordered    predniSONE (DELTASONE) 10 MG tablet  Daily     09/28/17 1334       Milton Ferguson, MD 09/29/17 2110

## 2017-09-28 NOTE — ED Triage Notes (Signed)
PT states she was seen at her PCP office yesterday and dx with pneumonia and was started on Levaquin. PT states she feels generalized weakness and has a congested non-productive cough.

## 2017-09-28 NOTE — ED Notes (Signed)
Pt denies taking her morning meds including BP meds

## 2017-09-28 NOTE — ED Notes (Signed)
Respiratory notified for neb tx

## 2017-09-28 NOTE — ED Provider Notes (Signed)
[facility] EMERGENCY DEPARTMENT Provider Note   CSN: [id number] Arrival date & time: 09/28/17  1015     History   Chief Complaint Chief Complaint  Patient presents with  . Cough    HPI Melissa Ross is a 84 y.o. female.  Patient complains of cough for a number days she was started on antibiotics yesterday.  Patient states she feels worse   The history is provided by the patient. No language interpreter was used.  Cough  This is a new problem. The current episode started 6 to 12 hours ago. The problem occurs constantly. The problem has not changed since onset.The cough is non-productive. There has been no fever. Pertinent negatives include no chest pain and no headaches.    Past Medical History:  Diagnosis Date  . Arthritis   . Asthma   . GERD (gastroesophageal reflux disease)   . Hypertension   . Myocardial infarction ([place]) 03/2013  . Respiratory failure [facility])    August 2016, requiring intubation  . Ventral hernia     Patient Active Problem List   Diagnosis Date Noted  . Protein-calorie malnutrition, severe 09/12/2015  . Syncopal episodes 09/11/2015  . Chronic leg pain 09/11/2015  . Dyslipidemia 09/11/2015  . Subacute Bronchitis 09/11/2015  . History of Respiratory failure requiring intubation ([place]) 09/11/2015  . CAD (coronary artery disease) 09/11/2015  . Constipation 08/25/2015  . HTN (hypertension) 07/18/2012    Past Surgical History:  Procedure Laterality Date  . APPENDECTOMY    . CATARACT EXTRACTION W/PHACO  11/09/2011   Procedure: CATARACT EXTRACTION PHACO AND INTRAOCULAR LENS PLACEMENT (IOC);  Surgeon: Elta Guadeloupe T. Gershon Crane, MD;  Location: [facility] OR[facility];  Service: Ophthalmology;  Laterality: Right;  CDE 10.01  . CHOLECYSTECTOMY    . COLONOSCOPY N/A 10/30/2015   Procedure: COLONOSCOPY;  Surgeon: Rogene Houston, MD;  [facility];  Service: Endoscopy;  Laterality: N/A;  2:20 - moved to 4/20 @ 1:25  . ESOPHAGOGASTRODUODENOSCOPY (EGD) WITH ESOPHAGEAL  DILATION  05/11/2012   Procedure: ESOPHAGOGASTRODUODENOSCOPY (EGD) WITH ESOPHAGEAL DILATION;  Surgeon: Rogene Houston, MD;  [facility];  Service: Endoscopy;  Laterality: N/A;  . EYE SURGERY     left KPE  . INSERTION OF MESH  07/17/2012   Procedure: INSERTION OF MESH;  Surgeon: Gayland Curry, MD,FACS;  Location: [place];  Service: General;  Laterality: N/A;  . LEFT HEART CATHETERIZATION WITH CORONARY ANGIOGRAM N/A 03/19/2013   Procedure: LEFT HEART CATHETERIZATION WITH CORONARY ANGIOGRAM;  Surgeon: Clent Demark, MD;  Location: [place] CATH LAB;  Service: Cardiovascular;  Laterality: N/A;  . TOE SURGERY     right foot  . TONSILLECTOMY    . TUBAL LIGATION    . VENTRAL HERNIA REPAIR  07/17/2012   Procedure: LAPAROSCOPIC VENTRAL HERNIA;  Surgeon: Gayland Curry, MD,FACS;  Location: [place];  Service: General;  Laterality: N/A;    OB History    Gravida Para Term Preterm AB Living             2   SAB TAB Ectopic Multiple Live Births                   Home Medications    Prior to Admission medications   Medication Sig Start Date End Date Taking? Authorizing Provider  aspirin 81 MG chewable tablet Chew 1 tablet (81 mg total) by mouth daily. 11/04/15  Yes Rehman, Mechele Dawley, MD  atorvastatin (LIPITOR) 10 MG tablet Take 5 mg by mouth daily.  09/02/15  Yes [provider]  clopidogrel (PLAVIX) 75 MG tablet Take 75 mg by mouth daily.   Yes [provider]  famotidine (PEPCID) 20 MG tablet Take 1 tablet (20 mg total) by mouth 2 (two) times daily. Patient taking differently: Take 20 mg by mouth daily.  03/20/13  Yes Charolette Forward, MD  levofloxacin (LEVAQUIN) 750 MG tablet Take 1 tablet (750 mg total) by mouth every other day. 09/27/17  Yes Timmothy Euler, MD  lisinopril (PRINIVIL,ZESTRIL) 10 MG tablet Take 1 tablet (10 mg total) by mouth daily. 09/27/17  Yes Timmothy Euler, MD  nitroGLYCERIN (NITROSTAT) 0.4 MG SL tablet Place 1 tablet (0.4 mg total) under the tongue every 5  (five) minutes x 3 doses as needed for chest pain. 03/20/13  Yes Charolette Forward, MD  UNABLE TO FIND Muscle Cramp   Yes [provider]  predniSONE (DELTASONE) 10 MG tablet Take 2 tablets (20 mg total) by mouth daily. 09/28/17   Milton Ferguson, MD    Family History Family History  Problem Relation Age of Onset  . Heart disease Mother   . Stroke Mother   . Stroke Father   . Heart disease Father   . Hypertension Sister   . Cancer Brother        liver  . Breast cancer Unknown   . Ovarian cancer Unknown   . Colon cancer Unknown   . Malignant hyperthermia Neg Hx   . Pseudochol deficiency Neg Hx   . Hypotension Neg Hx   . Anesthesia problems Neg Hx     Social History Social History   Tobacco Use  . Smoking status: Never Smoker  . Smokeless tobacco: Never Used  Substance Use Topics  . Alcohol use: No  . Drug use: No     Allergies   Neosporin [neomycin-bacitracin zn-polymyx] and Penicillins   Review of Systems Review of Systems  Constitutional: Negative for appetite change and fatigue.  HENT: Negative for congestion, ear discharge and sinus pressure.   Eyes: Negative for discharge.  Respiratory: Positive for cough.   Cardiovascular: Negative for chest pain.  Gastrointestinal: Negative for abdominal pain and diarrhea.  Genitourinary: Negative for frequency and hematuria.  Musculoskeletal: Negative for back pain.  Skin: Negative for rash.  Neurological: Negative for seizures and headaches.  Psychiatric/Behavioral: Negative for hallucinations.     Physical Exam Updated Vital Signs BP (!) 189/62   Pulse 99   Temp 98.5 F (36.9 C) (Oral)   Resp 18   Ht 5\' 8"  (1.727 m)   Wt 82.6 kg (182 lb)   SpO2 98%   BMI 27.67 kg/m   Physical Exam  Constitutional: She is oriented to person, place, and time. She appears well-developed.  HENT:  Head: Normocephalic.  Eyes: Conjunctivae and EOM are normal. No scleral icterus.  Neck: Neck supple. No thyromegaly present.    Cardiovascular: Normal rate and regular rhythm. Exam reveals no gallop and no friction rub.  No murmur heard. Pulmonary/Chest: No stridor. She has wheezes. She has no rales. She exhibits no tenderness.  Abdominal: She exhibits no distension. There is no tenderness. There is no rebound.  Musculoskeletal: Normal range of motion. She exhibits no edema.  Lymphadenopathy:    She has no cervical adenopathy.  Neurological: She is oriented to person, place, and time. She exhibits normal muscle tone. Coordination normal.  Skin: No rash noted. No erythema.  Psychiatric: She has a normal mood and affect. Her behavior is normal.  ED Treatments / Results  Labs (all labs ordered are listed, but only abnormal results are displayed) Labs Reviewed - No data to display  EKG  EKG Interpretation None       Radiology Dg Chest 2 View  Result Date: 09/27/2017 CLINICAL DATA:  Cough. EXAM: CHEST - 2 VIEW COMPARISON:  No recent prior. FINDINGS: Mediastinum and hilar structures normal. Heart size normal. Mild left base subsegmental atelectasis/infiltrate. No pleural effusion or pneumothorax. Diffuse osteopenia degenerative change. No acute bony abnormality. IMPRESSION: Mild left base subsegmental atelectasis/infiltrate. Electronically Signed   By: Marcello Moores  Register   On: 09/27/2017 14:03    Procedures Procedures (including critical care time)  Medications Ordered in ED Medications  albuterol (PROVENTIL HFA;VENTOLIN HFA) 108 (90 Base) MCG/ACT inhaler 2 puff (not administered)  ipratropium-albuterol (DUONEB) 0.5-2.5 (3) MG/3ML nebulizer solution 3 mL (3 mLs Nebulization Given 09/28/17 1210)  albuterol (PROVENTIL) (2.5 MG/3ML) 0.083% nebulizer solution 2.5 mg (2.5 mg Nebulization Given 09/28/17 1210)  predniSONE (DELTASONE) tablet 60 mg (60 mg Oral Given 09/28/17 1206)     Initial Impression / Assessment and Plan / ED Course  I have reviewed the triage vital signs and the nursing notes.  Pertinent  labs & imaging results that were available during my care of the patient were reviewed by me and considered in my medical decision making (see chart for details).     Patient had chemistries and CBC yesterday that were unremarkable reviewed by me.  She also had a chest x-ray yesterday that showed possible pneumonia this is also been reviewed by me.  Patient was given neb treatment here in the emergency department and improved.  She is discharged home with prednisone and an albuterol inhaler.  She will continue Levaquin and follow-up with her PCP and return if any problems  Final Clinical Impressions(s) / ED Diagnoses   Final diagnoses:  Community acquired pneumonia, unspecified laterality    ED Discharge Orders        Ordered    predniSONE (DELTASONE) 10 MG tablet  Daily     09/28/17 1334       Milton Ferguson, MD 09/28/17 1342

## 2017-09-28 NOTE — Discharge Instructions (Signed)
Follow-up with your doctor in 1-2 weeks.  Return if having any problems

## 2017-09-28 NOTE — ED Notes (Signed)
Pt refused chest x-ray

## 2017-10-18 ENCOUNTER — Other Ambulatory Visit: Payer: Self-pay | Admitting: Family Medicine

## 2017-10-20 ENCOUNTER — Encounter: Payer: Self-pay | Admitting: Family Medicine

## 2017-10-20 ENCOUNTER — Ambulatory Visit (INDEPENDENT_AMBULATORY_CARE_PROVIDER_SITE_OTHER): Payer: Medicare Other | Admitting: Family Medicine

## 2017-10-20 ENCOUNTER — Ambulatory Visit (INDEPENDENT_AMBULATORY_CARE_PROVIDER_SITE_OTHER): Payer: Medicare Other

## 2017-10-20 VITALS — BP 143/89 | HR 93 | Temp 97.5°F | Ht 68.0 in | Wt 181.4 lb

## 2017-10-20 DIAGNOSIS — I10 Essential (primary) hypertension: Secondary | ICD-10-CM

## 2017-10-20 DIAGNOSIS — J189 Pneumonia, unspecified organism: Secondary | ICD-10-CM

## 2017-10-20 DIAGNOSIS — R5383 Other fatigue: Secondary | ICD-10-CM

## 2017-10-20 NOTE — Progress Notes (Signed)
   HPI  Patient presents today for follow-up hypertension as well as pneumonia.  Patient was seen last visit for cough, chest x-ray was concerning for infiltrate she was started on renally dosed Levaquin.  She was seen in the emergency room 1-2 days later and given a course of prednisone and albuterol. She states that her breathing and cough have improved, however she still very fatigued and weak.  She is return back to normal activities for the most part walking, working in [employer], working in the barn, doing housework.  Blood pressure Patient with very elevated blood pressure last visit Her BP cuff is checked against ours today and is about 30 mmHg lower than ours, her blood pressure was checked on her own cuff to be 105 yesterday.  Tolerating lisinopril well as far she can tell.  PMH: Smoking status noted ROS: Per HPI  Objective: BP (!) 143/89   Pulse 93   Temp (!) 97.5 F (36.4 C) (Oral)   Ht 5' 8" (1.727 m)   Wt 181 lb 6.4 oz (82.3 kg)   BMI 27.58 kg/m  Gen: NAD, alert, cooperative with exam HEENT: NCAT CV: RRR, good S1/S2, no murmur Resp: CTABL, no wheezes, non-labored Neuro: Alert and oriented, No gross deficits  Assessment and plan:  #CAP Patient with infiltrate on last month's x-ray, repeat x-ray Patient still recovering, however breathing and cough have improved, repeat x-ray to be sure that additional buttocks are further imaging or not warranted. Levaquin was renally dosed, repeat labs show slightly higher GFR- now 55   #HTN Much improved on 10 mg of lisinopril, no changes Repeat labs   #Fatigue Likely due to illness, repeat labs including renal function with recently starting lisinopril    Orders Placed This Encounter  Procedures  . DG Chest 2 View    Standing Status:   Future    Standing Expiration Date:   12/21/2018    Order Specific Question:   Reason for Exam (SYMPTOM  OR DIAGNOSIS REQUIRED)    Answer:   Follow up after CAP, pt symps  improved but still weak/recovering    Order Specific Question:   Preferred imaging location?    Answer:   Internal    Order Specific Question:   Radiology Contrast Protocol - do NOT remove file path    Answer:   _0 [email or web address]\[email or web address].[email or web address]  . CBC with Differential/Platelet  . BMP8+EGFR    No orders of the defined types were placed in this encounter.   Laroy Apple, MD [facility] 10/20/2017, 2:03 PM

## 2017-10-21 LAB — CBC WITH DIFFERENTIAL/PLATELET
Basophils Absolute: 0 10*3/uL (ref 0.0–0.2)
Basos: 1 %
EOS (ABSOLUTE): 0.2 10*3/uL (ref 0.0–0.4)
Eos: 3 %
Hematocrit: 37.4 % (ref 34.0–46.6)
Hemoglobin: 12.2 g/dL (ref 11.1–15.9)
IMMATURE GRANULOCYTES: 0 %
Immature Grans (Abs): 0 10*3/uL (ref 0.0–0.1)
LYMPHS ABS: 2 10*3/uL (ref 0.7–3.1)
Lymphs: 31 %
MCH: 30.8 pg (ref 26.6–33.0)
MCHC: 32.6 g/dL (ref 31.5–35.7)
MCV: 94 fL (ref 79–97)
Monocytes Absolute: 0.4 10*3/uL (ref 0.1–0.9)
Monocytes: 6 %
NEUTROS PCT: 59 %
Neutrophils Absolute: 3.9 10*3/uL (ref 1.4–7.0)
PLATELETS: 266 10*3/uL (ref 150–379)
RBC: 3.96 x10E6/uL (ref 3.77–5.28)
RDW: 13.8 % (ref 12.3–15.4)
WBC: 6.5 10*3/uL (ref 3.4–10.8)

## 2017-10-21 LAB — BMP8+EGFR
BUN/Creatinine Ratio: 21 (ref 12–28)
BUN: 24 mg/dL (ref 8–27)
CALCIUM: 9.7 mg/dL (ref 8.7–10.3)
CO2: 26 mmol/L (ref 20–29)
CREATININE: 1.13 mg/dL — AB (ref 0.57–1.00)
Chloride: 101 mmol/L (ref 96–106)
GFR, EST AFRICAN AMERICAN: 52 mL/min/{1.73_m2} — AB (ref >59)
GFR, EST NON AFRICAN AMERICAN: 45 mL/min/{1.73_m2} — AB (ref >59)
Glucose: 118 mg/dL — ABNORMAL HIGH (ref 65–99)
Potassium: 4.6 mmol/L (ref 3.5–5.2)
Sodium: 144 mmol/L (ref 134–144)

## 2017-10-26 ENCOUNTER — Other Ambulatory Visit: Payer: Self-pay | Admitting: *Deleted

## 2017-10-26 MED ORDER — LISINOPRIL 10 MG PO TABS: 10.0000 mg | ORAL_TABLET | Freq: Every day | ORAL | 0 refills | Status: DC

## 2017-11-16 DIAGNOSIS — Z7189 Other specified counseling: Secondary | ICD-10-CM | POA: Diagnosis not present

## 2017-11-16 DIAGNOSIS — Z1339 Encounter for screening examination for other mental health and behavioral disorders: Secondary | ICD-10-CM | POA: Diagnosis not present

## 2017-11-16 DIAGNOSIS — Z0000 Encounter for general adult medical examination without abnormal findings: Secondary | ICD-10-CM | POA: Diagnosis not present

## 2017-11-16 DIAGNOSIS — Z299 Encounter for prophylactic measures, unspecified: Secondary | ICD-10-CM | POA: Diagnosis not present

## 2017-11-16 DIAGNOSIS — Z1211 Encounter for screening for malignant neoplasm of colon: Secondary | ICD-10-CM | POA: Diagnosis not present

## 2017-11-16 DIAGNOSIS — Z1331 Encounter for screening for depression: Secondary | ICD-10-CM | POA: Diagnosis not present

## 2017-11-16 DIAGNOSIS — I10 Essential (primary) hypertension: Secondary | ICD-10-CM | POA: Diagnosis not present

## 2017-11-23 DIAGNOSIS — Z299 Encounter for prophylactic measures, unspecified: Secondary | ICD-10-CM | POA: Diagnosis not present

## 2017-11-23 DIAGNOSIS — L570 Actinic keratosis: Secondary | ICD-10-CM | POA: Diagnosis not present

## 2017-12-15 DIAGNOSIS — I509 Heart failure, unspecified: Secondary | ICD-10-CM | POA: Diagnosis not present

## 2017-12-15 DIAGNOSIS — I2510 Atherosclerotic heart disease of native coronary artery without angina pectoris: Secondary | ICD-10-CM | POA: Diagnosis not present

## 2017-12-15 DIAGNOSIS — I10 Essential (primary) hypertension: Secondary | ICD-10-CM | POA: Diagnosis not present

## 2017-12-15 DIAGNOSIS — I5181 Takotsubo syndrome: Secondary | ICD-10-CM | POA: Diagnosis not present

## 2017-12-15 DIAGNOSIS — E119 Type 2 diabetes mellitus without complications: Secondary | ICD-10-CM | POA: Diagnosis not present

## 2018-01-04 DIAGNOSIS — Z713 Dietary counseling and surveillance: Secondary | ICD-10-CM | POA: Diagnosis not present

## 2018-01-04 DIAGNOSIS — B889 Infestation, unspecified: Secondary | ICD-10-CM | POA: Diagnosis not present

## 2018-01-04 DIAGNOSIS — I10 Essential (primary) hypertension: Secondary | ICD-10-CM | POA: Diagnosis not present

## 2018-01-04 DIAGNOSIS — Z299 Encounter for prophylactic measures, unspecified: Secondary | ICD-10-CM | POA: Diagnosis not present

## 2018-01-16 DIAGNOSIS — K4090 Unilateral inguinal hernia, without obstruction or gangrene, not specified as recurrent: Secondary | ICD-10-CM | POA: Diagnosis not present

## 2018-01-16 DIAGNOSIS — I429 Cardiomyopathy, unspecified: Secondary | ICD-10-CM | POA: Diagnosis not present

## 2018-01-16 DIAGNOSIS — R1031 Right lower quadrant pain: Secondary | ICD-10-CM | POA: Diagnosis not present

## 2018-01-16 DIAGNOSIS — I10 Essential (primary) hypertension: Secondary | ICD-10-CM | POA: Diagnosis not present

## 2018-01-16 DIAGNOSIS — Z299 Encounter for prophylactic measures, unspecified: Secondary | ICD-10-CM | POA: Diagnosis not present

## 2018-01-16 DIAGNOSIS — R109 Unspecified abdominal pain: Secondary | ICD-10-CM | POA: Diagnosis not present

## 2018-01-17 ENCOUNTER — Other Ambulatory Visit: Payer: Self-pay | Admitting: Family Medicine

## 2018-01-17 DIAGNOSIS — R1031 Right lower quadrant pain: Secondary | ICD-10-CM | POA: Diagnosis not present

## 2018-01-17 DIAGNOSIS — K4090 Unilateral inguinal hernia, without obstruction or gangrene, not specified as recurrent: Secondary | ICD-10-CM | POA: Diagnosis not present

## 2018-01-17 DIAGNOSIS — R1030 Lower abdominal pain, unspecified: Secondary | ICD-10-CM | POA: Diagnosis not present

## 2018-02-23 DIAGNOSIS — Z789 Other specified health status: Secondary | ICD-10-CM | POA: Diagnosis not present

## 2018-02-23 DIAGNOSIS — E7800 Pure hypercholesterolemia, unspecified: Secondary | ICD-10-CM | POA: Diagnosis not present

## 2018-02-23 DIAGNOSIS — Z299 Encounter for prophylactic measures, unspecified: Secondary | ICD-10-CM | POA: Diagnosis not present

## 2018-02-23 DIAGNOSIS — Z0000 Encounter for general adult medical examination without abnormal findings: Secondary | ICD-10-CM | POA: Diagnosis not present

## 2018-02-24 DIAGNOSIS — Z0000 Encounter for general adult medical examination without abnormal findings: Secondary | ICD-10-CM | POA: Diagnosis not present

## 2018-02-24 DIAGNOSIS — Z79899 Other long term (current) drug therapy: Secondary | ICD-10-CM | POA: Diagnosis not present

## 2018-02-24 DIAGNOSIS — R5383 Other fatigue: Secondary | ICD-10-CM | POA: Diagnosis not present

## 2018-02-24 DIAGNOSIS — E7800 Pure hypercholesterolemia, unspecified: Secondary | ICD-10-CM | POA: Diagnosis not present

## 2018-03-23 DIAGNOSIS — I429 Cardiomyopathy, unspecified: Secondary | ICD-10-CM | POA: Diagnosis not present

## 2018-03-23 DIAGNOSIS — I10 Essential (primary) hypertension: Secondary | ICD-10-CM | POA: Diagnosis not present

## 2018-03-23 DIAGNOSIS — N183 Chronic kidney disease, stage 3 (moderate): Secondary | ICD-10-CM | POA: Diagnosis not present

## 2018-03-23 DIAGNOSIS — M8580 Other specified disorders of bone density and structure, unspecified site: Secondary | ICD-10-CM | POA: Diagnosis not present

## 2018-03-23 DIAGNOSIS — Z299 Encounter for prophylactic measures, unspecified: Secondary | ICD-10-CM | POA: Diagnosis not present

## 2018-04-04 DIAGNOSIS — I5181 Takotsubo syndrome: Secondary | ICD-10-CM | POA: Diagnosis not present

## 2018-04-04 DIAGNOSIS — I2510 Atherosclerotic heart disease of native coronary artery without angina pectoris: Secondary | ICD-10-CM | POA: Diagnosis not present

## 2018-04-04 DIAGNOSIS — I509 Heart failure, unspecified: Secondary | ICD-10-CM | POA: Diagnosis not present

## 2018-04-04 DIAGNOSIS — I10 Essential (primary) hypertension: Secondary | ICD-10-CM | POA: Diagnosis not present

## 2018-04-04 DIAGNOSIS — E1140 Type 2 diabetes mellitus with diabetic neuropathy, unspecified: Secondary | ICD-10-CM | POA: Diagnosis not present

## 2018-04-20 DIAGNOSIS — Z299 Encounter for prophylactic measures, unspecified: Secondary | ICD-10-CM | POA: Diagnosis not present

## 2018-04-20 DIAGNOSIS — I429 Cardiomyopathy, unspecified: Secondary | ICD-10-CM | POA: Diagnosis not present

## 2018-04-20 DIAGNOSIS — I10 Essential (primary) hypertension: Secondary | ICD-10-CM | POA: Diagnosis not present

## 2018-04-20 DIAGNOSIS — Z713 Dietary counseling and surveillance: Secondary | ICD-10-CM | POA: Diagnosis not present

## 2018-05-08 ENCOUNTER — Other Ambulatory Visit: Payer: Self-pay

## 2018-05-08 NOTE — Patient Outreach (Signed)
[facility]) Care Management  05/08/2018  Melissa Ross 22-Mar-1931 [id number]   Medication Adherence call to Melissa Ross left a message for patient to call back patient is due on Lisinopril 10 mg and Atorvastatin 10 mg. Melissa Ross is showing past due under [place].   [place] Management Direct Dial [phone]  Fax [phone] [email or web address]

## 2018-07-18 ENCOUNTER — Other Ambulatory Visit: Payer: Self-pay | Admitting: *Deleted

## 2018-07-18 NOTE — Telephone Encounter (Signed)
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

## 2018-07-18 NOTE — Telephone Encounter (Signed)
Patient states that she has a new pcp and is not seen at this office at this time

## 2018-07-18 NOTE — Telephone Encounter (Signed)
[id number]-cb 1/7

## 2019-08-06 DIAGNOSIS — I10 Essential (primary) hypertension: Secondary | ICD-10-CM | POA: Diagnosis not present

## 2019-09-09 DIAGNOSIS — I10 Essential (primary) hypertension: Secondary | ICD-10-CM | POA: Diagnosis not present

## 2019-10-09 DIAGNOSIS — I10 Essential (primary) hypertension: Secondary | ICD-10-CM | POA: Diagnosis not present

## 2019-10-10 DIAGNOSIS — I10 Essential (primary) hypertension: Secondary | ICD-10-CM | POA: Diagnosis not present

## 2019-10-10 DIAGNOSIS — N183 Chronic kidney disease, stage 3 unspecified: Secondary | ICD-10-CM | POA: Diagnosis not present

## 2019-10-29 DIAGNOSIS — Z961 Presence of intraocular lens: Secondary | ICD-10-CM | POA: Diagnosis not present

## 2019-10-29 DIAGNOSIS — H353132 Nonexudative age-related macular degeneration, bilateral, intermediate dry stage: Secondary | ICD-10-CM | POA: Diagnosis not present

## 2019-10-31 DIAGNOSIS — Z1211 Encounter for screening for malignant neoplasm of colon: Secondary | ICD-10-CM | POA: Diagnosis not present

## 2019-10-31 DIAGNOSIS — Z299 Encounter for prophylactic measures, unspecified: Secondary | ICD-10-CM | POA: Diagnosis not present

## 2019-10-31 DIAGNOSIS — Z7189 Other specified counseling: Secondary | ICD-10-CM | POA: Diagnosis not present

## 2019-10-31 DIAGNOSIS — I10 Essential (primary) hypertension: Secondary | ICD-10-CM | POA: Diagnosis not present

## 2019-10-31 DIAGNOSIS — Z0000 Encounter for general adult medical examination without abnormal findings: Secondary | ICD-10-CM | POA: Diagnosis not present

## 2019-11-01 DIAGNOSIS — R5383 Other fatigue: Secondary | ICD-10-CM | POA: Diagnosis not present

## 2019-11-01 DIAGNOSIS — E7800 Pure hypercholesterolemia, unspecified: Secondary | ICD-10-CM | POA: Diagnosis not present

## 2019-11-01 DIAGNOSIS — Z79899 Other long term (current) drug therapy: Secondary | ICD-10-CM | POA: Diagnosis not present

## 2019-11-09 DIAGNOSIS — I10 Essential (primary) hypertension: Secondary | ICD-10-CM | POA: Diagnosis not present

## 2019-12-09 DIAGNOSIS — I10 Essential (primary) hypertension: Secondary | ICD-10-CM | POA: Diagnosis not present

## 2019-12-10 DIAGNOSIS — I429 Cardiomyopathy, unspecified: Secondary | ICD-10-CM | POA: Diagnosis not present

## 2019-12-10 DIAGNOSIS — I10 Essential (primary) hypertension: Secondary | ICD-10-CM | POA: Diagnosis not present

## 2019-12-28 ENCOUNTER — Emergency Department (HOSPITAL_COMMUNITY): Payer: Medicare Other

## 2019-12-28 ENCOUNTER — Other Ambulatory Visit: Payer: Self-pay

## 2019-12-28 ENCOUNTER — Emergency Department (HOSPITAL_COMMUNITY)
Admission: EM | Admit: 2019-12-28 | Discharge: 2019-12-28 | Disposition: A | Discharge status: Home / Self Care | Payer: Medicare Other | Attending: Emergency Medicine | Admitting: Emergency Medicine

## 2019-12-28 ENCOUNTER — Encounter (HOSPITAL_COMMUNITY): Payer: Self-pay | Admitting: *Deleted

## 2019-12-28 DIAGNOSIS — I2510 Atherosclerotic heart disease of native coronary artery without angina pectoris: Secondary | ICD-10-CM | POA: Diagnosis not present

## 2019-12-28 DIAGNOSIS — R079 Chest pain, unspecified: Secondary | ICD-10-CM | POA: Diagnosis not present

## 2019-12-28 DIAGNOSIS — I10 Essential (primary) hypertension: Secondary | ICD-10-CM | POA: Insufficient documentation

## 2019-12-28 DIAGNOSIS — J9690 Respiratory failure, unspecified, unspecified whether with hypoxia or hypercapnia: Secondary | ICD-10-CM | POA: Insufficient documentation

## 2019-12-28 DIAGNOSIS — Z7901 Long term (current) use of anticoagulants: Secondary | ICD-10-CM | POA: Insufficient documentation

## 2019-12-28 DIAGNOSIS — Z7982 Long term (current) use of aspirin: Secondary | ICD-10-CM | POA: Diagnosis not present

## 2019-12-28 DIAGNOSIS — Z880 Allergy status to penicillin: Secondary | ICD-10-CM | POA: Insufficient documentation

## 2019-12-28 DIAGNOSIS — Z79899 Other long term (current) drug therapy: Secondary | ICD-10-CM | POA: Insufficient documentation

## 2019-12-28 DIAGNOSIS — I252 Old myocardial infarction: Secondary | ICD-10-CM | POA: Insufficient documentation

## 2019-12-28 DIAGNOSIS — K2100 Gastro-esophageal reflux disease with esophagitis, without bleeding: Secondary | ICD-10-CM

## 2019-12-28 DIAGNOSIS — J45909 Unspecified asthma, uncomplicated: Secondary | ICD-10-CM | POA: Diagnosis not present

## 2019-12-28 DIAGNOSIS — M25512 Pain in left shoulder: Secondary | ICD-10-CM | POA: Diagnosis not present

## 2019-12-28 DIAGNOSIS — R072 Precordial pain: Secondary | ICD-10-CM | POA: Diagnosis present

## 2019-12-28 DIAGNOSIS — J9811 Atelectasis: Secondary | ICD-10-CM | POA: Diagnosis not present

## 2019-12-28 LAB — CBC WITH DIFFERENTIAL/PLATELET
Abs Immature Granulocytes: 0.01 10*3/uL (ref 0.00–0.07)
Basophils Absolute: 0.1 10*3/uL (ref 0.0–0.1)
Basophils Relative: 1 %
Eosinophils Absolute: 0.2 10*3/uL (ref 0.0–0.5)
Eosinophils Relative: 3 %
HCT: 41.7 % (ref 36.0–46.0)
Hemoglobin: 13.2 g/dL (ref 12.0–15.0)
Immature Granulocytes: 0 %
Lymphocytes Relative: 35 %
Lymphs Abs: 1.8 10*3/uL (ref 0.7–4.0)
MCH: 30.9 pg (ref 26.0–34.0)
MCHC: 31.7 g/dL (ref 30.0–36.0)
MCV: 97.7 fL (ref 80.0–100.0)
Monocytes Absolute: 0.4 10*3/uL (ref 0.1–1.0)
Monocytes Relative: 8 %
Neutro Abs: 2.8 10*3/uL (ref 1.7–7.7)
Neutrophils Relative %: 53 %
Platelets: 276 10*3/uL (ref 150–400)
RBC: 4.27 MIL/uL (ref 3.87–5.11)
RDW: 12.8 % (ref 11.5–15.5)
WBC: 5.3 10*3/uL (ref 4.0–10.5)
nRBC: 0 % (ref 0.0–0.2)

## 2019-12-28 LAB — BASIC METABOLIC PANEL
Anion gap: 9 (ref 5–15)
BUN: 18 mg/dL (ref 8–23)
CO2: 29 mmol/L (ref 22–32)
Calcium: 9.1 mg/dL (ref 8.9–10.3)
Chloride: 102 mmol/L (ref 98–111)
Creatinine, Ser: 0.99 mg/dL (ref 0.44–1.00)
GFR calc Af Amer: 60 mL/min — ABNORMAL LOW (ref >60)
GFR calc non Af Amer: 52 mL/min — ABNORMAL LOW (ref >60)
Glucose, Bld: 105 mg/dL — ABNORMAL HIGH (ref 70–99)
Potassium: 3.9 mmol/L (ref 3.5–5.1)
Sodium: 140 mmol/L (ref 135–145)

## 2019-12-28 LAB — TROPONIN I (HIGH SENSITIVITY)
Troponin I (High Sensitivity): 6 ng/L (ref <18)
Troponin I (High Sensitivity): 6 ng/L (ref <18)

## 2019-12-28 MED ORDER — PANTOPRAZOLE SODIUM 40 MG PO TBEC
40.0000 mg | DELAYED_RELEASE_TABLET | Freq: Every day | ORAL | 0 refills | Status: DC
Start: 2019-12-28 — End: 2023-06-15

## 2019-12-28 MED ORDER — HYDRALAZINE HCL 20 MG/ML IJ SOLN
5.0000 mg | Freq: Once | INTRAMUSCULAR | Status: AC
  Administered 2019-12-28: 5 mg via INTRAVENOUS
  Filled 2019-12-28: qty 1

## 2019-12-28 MED ORDER — LISINOPRIL 5 MG PO TABS
5.0000 mg | ORAL_TABLET | Freq: Every day | ORAL | 0 refills | Status: DC
Start: 2019-12-28 — End: 2024-03-16

## 2019-12-28 MED ORDER — SIMETHICONE 80 MG PO CHEW
160.0000 mg | CHEWABLE_TABLET | Freq: Once | ORAL | Status: AC
  Administered 2019-12-28: 160 mg via ORAL
  Filled 2019-12-28: qty 2

## 2019-12-28 MED ORDER — PANTOPRAZOLE SODIUM 40 MG IV SOLR
40.0000 mg | Freq: Once | INTRAVENOUS | Status: AC
  Administered 2019-12-28: 40 mg via INTRAVENOUS
  Filled 2019-12-28: qty 40

## 2019-12-28 NOTE — Discharge Instructions (Addendum)
Your blood pressure today was elevated.  You were given medication to lower your blood pressure today.  Continue taking your carvedilol 6.25 mg 2 times daily and I have added 5 mg of lisinopril to take as well for your blood pressure.  Please stop the famotidine and start taking the Protonix as directed for your acid reflux.  Call your cardiologist on Monday to arrange a follow-up appointment.

## 2019-12-28 NOTE — ED Notes (Signed)
Reported from Beverlee Nims that daughter-in-law has pts pocketbook.  Pt reports that  [place] received pts purse.

## 2019-12-28 NOTE — ED Notes (Signed)
Pt stated her pants were wet brought pt paper scrub pants and new depends pt refused at this time.

## 2019-12-28 NOTE — ED Triage Notes (Signed)
States she has pain in her chest and has been taking her BP through the night and it was elevated

## 2019-12-28 NOTE — ED Provider Notes (Signed)
[facility] Provider Note   CSN: [id number] Arrival date & time: 12/28/19  1134     History Chief Complaint  Patient presents with  . Chest Pain    Melissa Ross is a 86 y.o. female.  HPI      Melissa Ross is a 86 y.o. female with past medical history of asthma, hypertension, and previous MI, who presents to the Emergency Department complaining of substernal chest pain and elevated blood pressure.  She states her symptoms have been present for several days, but worse last evening.  She states that she attempted to see her PCP today for her symptoms, but unable to get an appointment.  She then presented to the urgent care in [facility] and told her blood pressure was elevated and needed further evaluation through the ER.  She describes a dull, heavy pressure sensation to the middle of her chest.  At times, the pain radiates into her left shoulder blade area.  She also endorses frequent belching and nausea.  She checked her blood pressure multiple times yesterday and through the night and reports systolic pressures greater than 200.  She denies pain into her arm or jaw.  No diaphoresis, abdominal pain or vomiting.  She states that she had similar symptoms last year and was admitted at another hospital for evaluation.  She is followed by cardiology in [place].  PCP: Dr. Woody Seller Cardiology: Dr. Terrence Dupont  Past Medical History:  Diagnosis Date  . Arthritis   . Asthma   . GERD (gastroesophageal reflux disease)   . Hypertension   . Myocardial infarction ([place]) 03/2013  . Respiratory failure [facility])    August 2016, requiring intubation  . Ventral hernia     Patient Active Problem List   Diagnosis Date Noted  . Protein-calorie malnutrition, severe 09/12/2015  . Syncopal episodes 09/11/2015  . Chronic leg pain 09/11/2015  . Dyslipidemia 09/11/2015  . Subacute Bronchitis 09/11/2015  . History of Respiratory failure requiring intubation ([place]) 09/11/2015  . CAD  (coronary artery disease) 09/11/2015  . Constipation 08/25/2015  . HTN (hypertension) 07/18/2012    Past Surgical History:  Procedure Laterality Date  . APPENDECTOMY    . CATARACT EXTRACTION W/PHACO  11/09/2011   Procedure: CATARACT EXTRACTION PHACO AND INTRAOCULAR LENS PLACEMENT (IOC);  Surgeon: Elta Guadeloupe T. Gershon Crane, MD;  Location: [facility];  Service: Ophthalmology;  Laterality: Right;  CDE 10.01  . CHOLECYSTECTOMY    . COLONOSCOPY N/A 10/30/2015   Procedure: COLONOSCOPY;  Surgeon: Rogene Houston, MD;  Location: [facility];  Service: Endoscopy;  Laterality: N/A;  2:20 - moved to 4/20 @ 1:25  . ESOPHAGOGASTRODUODENOSCOPY (EGD) WITH ESOPHAGEAL DILATION  05/11/2012   Procedure: ESOPHAGOGASTRODUODENOSCOPY (EGD) WITH ESOPHAGEAL DILATION;  Surgeon: Rogene Houston, MD;  [facility];  Service: Endoscopy;  Laterality: N/A;  . EYE SURGERY     left KPE  . INSERTION OF MESH  07/17/2012   Procedure: INSERTION OF MESH;  Surgeon: Gayland Curry, MD,FACS;  Location: [facility];  Service: General;  Laterality: N/A;  . LEFT HEART CATHETERIZATION WITH CORONARY ANGIOGRAM N/A 03/19/2013   Procedure: LEFT HEART CATHETERIZATION WITH CORONARY ANGIOGRAM;  Surgeon: Clent Demark, MD;  Location: [place] CATH LAB;  Service: Cardiovascular;  Laterality: N/A;  . TOE SURGERY     right foot  . TONSILLECTOMY    . TUBAL LIGATION    . VENTRAL HERNIA REPAIR  07/17/2012   Procedure: LAPAROSCOPIC VENTRAL HERNIA;  Surgeon: Gayland Curry, MD,FACS;  Location: [facility] OR;  Service: General;  Laterality: N/A;     OB History    Gravida      Para      Term      Preterm      AB      Living  2     SAB      TAB      Ectopic      Multiple      Live Births              Family History  Problem Relation Age of Onset  . Heart disease Mother   . Stroke Mother   . Stroke Father   . Heart disease Father   . Hypertension Sister   . Cancer Brother        liver  . Breast cancer Other   . Ovarian cancer Other   .  Colon cancer Other   . Malignant hyperthermia Neg Hx   . Pseudochol deficiency Neg Hx   . Hypotension Neg Hx   . Anesthesia problems Neg Hx     Social History   Tobacco Use  . Smoking status: Never Smoker  . Smokeless tobacco: Never Used  Vaping Use  . Vaping Use: Never used  Substance Use Topics  . Alcohol use: No  . Drug use: No    Home Medications Prior to Admission medications   Medication Sig Start Date End Date Taking? Authorizing Provider  aspirin 81 MG chewable tablet Chew 1 tablet (81 mg total) by mouth daily. 11/04/15   Rehman, Mechele Dawley, MD  atorvastatin (LIPITOR) 10 MG tablet Take 5 mg by mouth daily. 09/02/15   [provider]  carvedilol (COREG) 3.125 MG tablet Take 3.125 mg by mouth 2 (two) times daily. 12/08/19   [provider]  clopidogrel (PLAVIX) 75 MG tablet Take 75 mg by mouth daily.    [provider]  famotidine (PEPCID) 20 MG tablet Take 1 tablet (20 mg total) by mouth 2 (two) times daily. Patient taking differently: Take 20 mg by mouth daily.  03/20/13   Charolette Forward, MD  levofloxacin (LEVAQUIN) 750 MG tablet Take 1 tablet (750 mg total) by mouth every other day. 09/27/17   Timmothy Euler, MD  lisinopril (PRINIVIL,ZESTRIL) 10 MG tablet TAKE 1 TABLET BY MOUTH  DAILY 01/18/18   Timmothy Euler, MD  nitroGLYCERIN (NITROSTAT) 0.4 MG SL tablet Place 1 tablet (0.4 mg total) under the tongue every 5 (five) minutes x 3 doses as needed for chest pain. 03/20/13   Charolette Forward, MD  triamcinolone cream (KENALOG) 0.1 % Apply 1 application topically 2 (two) times daily as needed. 07/02/19   [provider]  UNABLE TO FIND Muscle Cramp    [provider]    Allergies    Neosporin [neomycin-bacitracin zn-polymyx] and Penicillins  Review of Systems   Review of Systems  Constitutional: Negative for chills, diaphoresis, fatigue and fever.  HENT: Negative for trouble swallowing.   Respiratory: Positive for shortness of  breath. Negative for cough and wheezing.   Cardiovascular: Positive for chest pain. Negative for palpitations.  Gastrointestinal: Positive for nausea. Negative for abdominal pain, blood in stool and vomiting.       Frequent belching  Genitourinary: Negative for difficulty urinating, dysuria and flank pain.  Musculoskeletal: Negative for back pain, myalgias and neck pain.  Skin: Negative for rash.  Neurological: Negative for dizziness, speech difficulty, weakness, numbness and headaches.  Hematological: Does not bruise/bleed easily.  Physical Exam Updated Vital Signs BP (!) 173/113   Pulse 68   Temp 98.6 F (37 C)   Resp 20   Ht 5\' 5"  (1.651 m)   Wt 80.7 kg   BMI 29.62 kg/m   Physical Exam Vitals and nursing note reviewed.  Constitutional:      General: She is not in acute distress.    Appearance: She is well-developed. She is not ill-appearing.  Cardiovascular:     Rate and Rhythm: Normal rate and regular rhythm.     Pulses: Normal pulses.  Pulmonary:     Effort: Pulmonary effort is normal. No respiratory distress.     Breath sounds: Normal breath sounds. No wheezing.  Chest:     Chest wall: No tenderness.  Abdominal:     General: There is no distension.     Palpations: Abdomen is soft.     Tenderness: There is no abdominal tenderness.  Musculoskeletal:        General: Normal range of motion.     Cervical back: Normal range of motion.  Skin:    General: Skin is warm.     Capillary Refill: Capillary refill takes less than 2 seconds.     Findings: No rash.  Neurological:     General: No focal deficit present.     Mental Status: She is alert.     Sensory: No sensory deficit.     Motor: No weakness.     Comments: CN II-XII grossly intact.  Speech clear.    Psychiatric:        Mood and Affect: Mood normal.     ED Results / Procedures / Treatments   Labs (all labs ordered are listed, but only abnormal results are displayed) Labs Reviewed  BASIC METABOLIC  PANEL - Abnormal; Notable for the following components:      Result Value   Glucose, Bld 105 (*)    GFR calc non Af Amer 52 (*)    GFR calc Af Amer 60 (*)    All other components within normal limits  CBC WITH DIFFERENTIAL/PLATELET  TROPONIN I (HIGH SENSITIVITY)  TROPONIN I (HIGH SENSITIVITY)    EKG EKG Interpretation  Date/Time:  Friday December 28 2019 11:47:38 EDT Ventricular Rate:  71 PR Interval:    QRS Duration: 77 QT Interval:  386 QTC Calculation: 420 R Axis:     Text Interpretation: Sinus rhythm Low voltage, precordial leads Confirmed by Milton Ferguson [phone]) on 12/28/2019 3:10:05 PM   Radiology DG Chest Port 1 View  Result Date: 12/28/2019 CLINICAL DATA:  Chest pain. EXAM: PORTABLE CHEST 1 VIEW COMPARISON:  10/20/2017. FINDINGS: Mediastinum and hilar structures normal. Low lung volumes. Mild bibasilar subsegmental atelectasis. No pleural effusion or pneumothorax. Heart size normal. No acute bony abnormality identified. IMPRESSION: Low lung volumes with mild bibasilar subsegmental atelectasis. Electronically Signed   By: Marcello Moores  Register   On: 12/28/2019 13:43     Procedures Procedures (including critical care time)  Medications Ordered in ED Medications  hydrALAZINE (APRESOLINE) injection 5 mg (5 mg Intravenous Given 12/28/19 1314)  simethicone (MYLICON) chewable tablet 160 mg (160 mg Oral Given 12/28/19 1610)  pantoprazole (PROTONIX) injection 40 mg (40 mg Intravenous Given 12/28/19 1610)    ED Course  I have reviewed the triage vital signs and the nursing notes.  Pertinent labs & imaging results that were available during my care of the patient were reviewed by me and considered in my medical decision making (see chart for details).  MDM Rules/Calculators/A&P                           Pt here with complains of chest pain and elevated blood pressure.  Took her medications prior to arrival.  Pressure improved here after hydralazine IV.  Pt well appearing, mild  chest pain with belching.  EKG w/o acute changes and CXR and troponins reassuring.  Doubt acute cardiac process.  Chest pain felt to be related to GERD.  Will start pt on PPI and start her back on her lisinopril.  Return precautions given.  I have also discussed findings with pt's son via telephone.     Final Clinical Impression(s) / ED Diagnoses Final diagnoses:  Hypertension, unspecified type  Gastroesophageal reflux disease with esophagitis without hemorrhage    Rx / DC Orders ED Discharge Orders    None       Bufford Lope 12/31/19 2250    Milton Ferguson, MD 12/31/19 2317

## 2020-01-09 DIAGNOSIS — K219 Gastro-esophageal reflux disease without esophagitis: Secondary | ICD-10-CM | POA: Diagnosis not present

## 2020-01-09 DIAGNOSIS — I10 Essential (primary) hypertension: Secondary | ICD-10-CM | POA: Diagnosis not present

## 2020-02-05 DIAGNOSIS — I429 Cardiomyopathy, unspecified: Secondary | ICD-10-CM | POA: Diagnosis not present

## 2020-02-05 DIAGNOSIS — G629 Polyneuropathy, unspecified: Secondary | ICD-10-CM | POA: Diagnosis not present

## 2020-02-05 DIAGNOSIS — I10 Essential (primary) hypertension: Secondary | ICD-10-CM | POA: Diagnosis not present

## 2020-02-05 DIAGNOSIS — N183 Chronic kidney disease, stage 3 unspecified: Secondary | ICD-10-CM | POA: Diagnosis not present

## 2020-02-05 DIAGNOSIS — Z299 Encounter for prophylactic measures, unspecified: Secondary | ICD-10-CM | POA: Diagnosis not present

## 2020-02-08 DIAGNOSIS — I10 Essential (primary) hypertension: Secondary | ICD-10-CM | POA: Diagnosis not present

## 2020-02-29 DIAGNOSIS — H524 Presbyopia: Secondary | ICD-10-CM | POA: Diagnosis not present

## 2020-02-29 DIAGNOSIS — H353122 Nonexudative age-related macular degeneration, left eye, intermediate dry stage: Secondary | ICD-10-CM | POA: Diagnosis not present

## 2020-03-11 DIAGNOSIS — I10 Essential (primary) hypertension: Secondary | ICD-10-CM | POA: Diagnosis not present

## 2020-03-14 DIAGNOSIS — Z7401 Bed confinement status: Secondary | ICD-10-CM | POA: Diagnosis not present

## 2020-03-14 DIAGNOSIS — Z66 Do not resuscitate: Secondary | ICD-10-CM | POA: Diagnosis not present

## 2020-03-14 DIAGNOSIS — R0902 Hypoxemia: Secondary | ICD-10-CM | POA: Diagnosis not present

## 2020-03-14 DIAGNOSIS — K5939 Other megacolon: Secondary | ICD-10-CM | POA: Diagnosis not present

## 2020-03-14 DIAGNOSIS — E785 Hyperlipidemia, unspecified: Secondary | ICD-10-CM | POA: Diagnosis not present

## 2020-03-14 DIAGNOSIS — Z743 Need for continuous supervision: Secondary | ICD-10-CM | POA: Diagnosis not present

## 2020-03-14 DIAGNOSIS — R05 Cough: Secondary | ICD-10-CM | POA: Diagnosis not present

## 2020-03-14 DIAGNOSIS — I2510 Atherosclerotic heart disease of native coronary artery without angina pectoris: Secondary | ICD-10-CM | POA: Diagnosis not present

## 2020-03-14 DIAGNOSIS — Z7982 Long term (current) use of aspirin: Secondary | ICD-10-CM | POA: Diagnosis not present

## 2020-03-14 DIAGNOSIS — J9811 Atelectasis: Secondary | ICD-10-CM | POA: Diagnosis not present

## 2020-03-14 DIAGNOSIS — U071 COVID-19: Secondary | ICD-10-CM | POA: Diagnosis not present

## 2020-03-14 DIAGNOSIS — R410 Disorientation, unspecified: Secondary | ICD-10-CM | POA: Diagnosis not present

## 2020-03-14 DIAGNOSIS — Z7902 Long term (current) use of antithrombotics/antiplatelets: Secondary | ICD-10-CM | POA: Diagnosis not present

## 2020-03-14 DIAGNOSIS — J1282 Pneumonia due to coronavirus disease 2019: Secondary | ICD-10-CM | POA: Diagnosis not present

## 2020-03-14 DIAGNOSIS — Z880 Allergy status to penicillin: Secondary | ICD-10-CM | POA: Diagnosis not present

## 2020-03-14 DIAGNOSIS — R110 Nausea: Secondary | ICD-10-CM | POA: Diagnosis not present

## 2020-03-14 DIAGNOSIS — R262 Difficulty in walking, not elsewhere classified: Secondary | ICD-10-CM | POA: Diagnosis not present

## 2020-03-14 DIAGNOSIS — Z9049 Acquired absence of other specified parts of digestive tract: Secondary | ICD-10-CM | POA: Diagnosis not present

## 2020-03-14 DIAGNOSIS — R531 Weakness: Secondary | ICD-10-CM | POA: Diagnosis not present

## 2020-03-14 DIAGNOSIS — E860 Dehydration: Secondary | ICD-10-CM | POA: Diagnosis not present

## 2020-03-14 DIAGNOSIS — R112 Nausea with vomiting, unspecified: Secondary | ICD-10-CM | POA: Diagnosis not present

## 2020-03-14 DIAGNOSIS — M6281 Muscle weakness (generalized): Secondary | ICD-10-CM | POA: Diagnosis not present

## 2020-03-14 DIAGNOSIS — R1111 Vomiting without nausea: Secondary | ICD-10-CM | POA: Diagnosis not present

## 2020-03-14 DIAGNOSIS — R092 Respiratory arrest: Secondary | ICD-10-CM | POA: Diagnosis not present

## 2020-03-14 DIAGNOSIS — I10 Essential (primary) hypertension: Secondary | ICD-10-CM | POA: Diagnosis not present

## 2020-03-14 DIAGNOSIS — R1110 Vomiting, unspecified: Secondary | ICD-10-CM | POA: Diagnosis not present

## 2020-03-14 DIAGNOSIS — R1310 Dysphagia, unspecified: Secondary | ICD-10-CM | POA: Diagnosis not present

## 2020-03-14 DIAGNOSIS — K219 Gastro-esophageal reflux disease without esophagitis: Secondary | ICD-10-CM | POA: Diagnosis not present

## 2020-03-14 DIAGNOSIS — H9190 Unspecified hearing loss, unspecified ear: Secondary | ICD-10-CM | POA: Diagnosis not present

## 2020-04-01 DIAGNOSIS — E119 Type 2 diabetes mellitus without complications: Secondary | ICD-10-CM | POA: Diagnosis not present

## 2020-04-01 DIAGNOSIS — M2041 Other hammer toe(s) (acquired), right foot: Secondary | ICD-10-CM | POA: Diagnosis not present

## 2020-04-01 DIAGNOSIS — M2011 Hallux valgus (acquired), right foot: Secondary | ICD-10-CM | POA: Diagnosis not present

## 2020-04-01 DIAGNOSIS — M2012 Hallux valgus (acquired), left foot: Secondary | ICD-10-CM | POA: Diagnosis not present

## 2020-04-01 DIAGNOSIS — I10 Essential (primary) hypertension: Secondary | ICD-10-CM | POA: Diagnosis not present

## 2020-04-01 DIAGNOSIS — Z743 Need for continuous supervision: Secondary | ICD-10-CM | POA: Diagnosis not present

## 2020-04-01 DIAGNOSIS — E785 Hyperlipidemia, unspecified: Secondary | ICD-10-CM | POA: Diagnosis not present

## 2020-04-01 DIAGNOSIS — M6281 Muscle weakness (generalized): Secondary | ICD-10-CM | POA: Diagnosis not present

## 2020-04-01 DIAGNOSIS — R110 Nausea: Secondary | ICD-10-CM | POA: Diagnosis not present

## 2020-04-01 DIAGNOSIS — R1310 Dysphagia, unspecified: Secondary | ICD-10-CM | POA: Diagnosis not present

## 2020-04-01 DIAGNOSIS — Z7401 Bed confinement status: Secondary | ICD-10-CM | POA: Diagnosis not present

## 2020-04-01 DIAGNOSIS — K219 Gastro-esophageal reflux disease without esophagitis: Secondary | ICD-10-CM | POA: Diagnosis not present

## 2020-04-01 DIAGNOSIS — I2510 Atherosclerotic heart disease of native coronary artery without angina pectoris: Secondary | ICD-10-CM | POA: Diagnosis not present

## 2020-04-01 DIAGNOSIS — R262 Difficulty in walking, not elsewhere classified: Secondary | ICD-10-CM | POA: Diagnosis not present

## 2020-04-01 DIAGNOSIS — R634 Abnormal weight loss: Secondary | ICD-10-CM | POA: Diagnosis not present

## 2020-04-01 DIAGNOSIS — B351 Tinea unguium: Secondary | ICD-10-CM | POA: Diagnosis not present

## 2020-04-01 DIAGNOSIS — R410 Disorientation, unspecified: Secondary | ICD-10-CM | POA: Diagnosis not present

## 2020-04-01 DIAGNOSIS — I7091 Generalized atherosclerosis: Secondary | ICD-10-CM | POA: Diagnosis not present

## 2020-04-01 DIAGNOSIS — M2042 Other hammer toe(s) (acquired), left foot: Secondary | ICD-10-CM | POA: Diagnosis not present

## 2020-04-02 DIAGNOSIS — I10 Essential (primary) hypertension: Secondary | ICD-10-CM | POA: Diagnosis not present

## 2020-04-02 DIAGNOSIS — E785 Hyperlipidemia, unspecified: Secondary | ICD-10-CM | POA: Diagnosis not present

## 2020-04-02 DIAGNOSIS — I2510 Atherosclerotic heart disease of native coronary artery without angina pectoris: Secondary | ICD-10-CM | POA: Diagnosis not present

## 2020-04-02 DIAGNOSIS — M6281 Muscle weakness (generalized): Secondary | ICD-10-CM | POA: Diagnosis not present

## 2020-04-10 DIAGNOSIS — I10 Essential (primary) hypertension: Secondary | ICD-10-CM | POA: Diagnosis not present

## 2020-04-10 DIAGNOSIS — K219 Gastro-esophageal reflux disease without esophagitis: Secondary | ICD-10-CM | POA: Diagnosis not present

## 2020-04-11 DIAGNOSIS — I2510 Atherosclerotic heart disease of native coronary artery without angina pectoris: Secondary | ICD-10-CM | POA: Diagnosis not present

## 2020-04-11 DIAGNOSIS — R110 Nausea: Secondary | ICD-10-CM | POA: Diagnosis not present

## 2020-04-11 DIAGNOSIS — E785 Hyperlipidemia, unspecified: Secondary | ICD-10-CM | POA: Diagnosis not present

## 2020-04-11 DIAGNOSIS — R634 Abnormal weight loss: Secondary | ICD-10-CM | POA: Diagnosis not present

## 2020-04-14 DIAGNOSIS — I7091 Generalized atherosclerosis: Secondary | ICD-10-CM | POA: Diagnosis not present

## 2020-04-14 DIAGNOSIS — M2041 Other hammer toe(s) (acquired), right foot: Secondary | ICD-10-CM | POA: Diagnosis not present

## 2020-04-14 DIAGNOSIS — M2011 Hallux valgus (acquired), right foot: Secondary | ICD-10-CM | POA: Diagnosis not present

## 2020-04-14 DIAGNOSIS — M2042 Other hammer toe(s) (acquired), left foot: Secondary | ICD-10-CM | POA: Diagnosis not present

## 2020-04-14 DIAGNOSIS — M2012 Hallux valgus (acquired), left foot: Secondary | ICD-10-CM | POA: Diagnosis not present

## 2020-04-18 DIAGNOSIS — R110 Nausea: Secondary | ICD-10-CM | POA: Diagnosis not present

## 2020-04-21 DIAGNOSIS — M6281 Muscle weakness (generalized): Secondary | ICD-10-CM | POA: Diagnosis not present

## 2020-04-21 DIAGNOSIS — J9601 Acute respiratory failure with hypoxia: Secondary | ICD-10-CM | POA: Diagnosis not present

## 2020-04-21 DIAGNOSIS — I10 Essential (primary) hypertension: Secondary | ICD-10-CM | POA: Diagnosis not present

## 2020-04-21 DIAGNOSIS — U071 COVID-19: Secondary | ICD-10-CM | POA: Diagnosis not present

## 2020-04-22 DIAGNOSIS — M6281 Muscle weakness (generalized): Secondary | ICD-10-CM | POA: Diagnosis not present

## 2020-04-23 DIAGNOSIS — U071 COVID-19: Secondary | ICD-10-CM | POA: Diagnosis not present

## 2020-04-23 DIAGNOSIS — I10 Essential (primary) hypertension: Secondary | ICD-10-CM | POA: Diagnosis not present

## 2020-04-23 DIAGNOSIS — I2510 Atherosclerotic heart disease of native coronary artery without angina pectoris: Secondary | ICD-10-CM | POA: Diagnosis not present

## 2020-04-23 DIAGNOSIS — M6281 Muscle weakness (generalized): Secondary | ICD-10-CM | POA: Diagnosis not present

## 2020-04-23 DIAGNOSIS — Z9981 Dependence on supplemental oxygen: Secondary | ICD-10-CM | POA: Diagnosis not present

## 2020-04-28 DIAGNOSIS — I2510 Atherosclerotic heart disease of native coronary artery without angina pectoris: Secondary | ICD-10-CM | POA: Diagnosis not present

## 2020-04-28 DIAGNOSIS — U071 COVID-19: Secondary | ICD-10-CM | POA: Diagnosis not present

## 2020-04-28 DIAGNOSIS — I10 Essential (primary) hypertension: Secondary | ICD-10-CM | POA: Diagnosis not present

## 2020-04-28 DIAGNOSIS — M6281 Muscle weakness (generalized): Secondary | ICD-10-CM | POA: Diagnosis not present

## 2020-04-28 DIAGNOSIS — Z9981 Dependence on supplemental oxygen: Secondary | ICD-10-CM | POA: Diagnosis not present

## 2020-04-30 DIAGNOSIS — I2510 Atherosclerotic heart disease of native coronary artery without angina pectoris: Secondary | ICD-10-CM | POA: Diagnosis not present

## 2020-04-30 DIAGNOSIS — M6281 Muscle weakness (generalized): Secondary | ICD-10-CM | POA: Diagnosis not present

## 2020-04-30 DIAGNOSIS — I10 Essential (primary) hypertension: Secondary | ICD-10-CM | POA: Diagnosis not present

## 2020-04-30 DIAGNOSIS — Z9981 Dependence on supplemental oxygen: Secondary | ICD-10-CM | POA: Diagnosis not present

## 2020-04-30 DIAGNOSIS — U071 COVID-19: Secondary | ICD-10-CM | POA: Diagnosis not present

## 2020-05-01 DIAGNOSIS — M6281 Muscle weakness (generalized): Secondary | ICD-10-CM | POA: Diagnosis not present

## 2020-05-01 DIAGNOSIS — U071 COVID-19: Secondary | ICD-10-CM | POA: Diagnosis not present

## 2020-05-01 DIAGNOSIS — I2510 Atherosclerotic heart disease of native coronary artery without angina pectoris: Secondary | ICD-10-CM | POA: Diagnosis not present

## 2020-05-01 DIAGNOSIS — I10 Essential (primary) hypertension: Secondary | ICD-10-CM | POA: Diagnosis not present

## 2020-05-01 DIAGNOSIS — Z9981 Dependence on supplemental oxygen: Secondary | ICD-10-CM | POA: Diagnosis not present

## 2020-05-05 DIAGNOSIS — M6281 Muscle weakness (generalized): Secondary | ICD-10-CM | POA: Diagnosis not present

## 2020-05-05 DIAGNOSIS — I2510 Atherosclerotic heart disease of native coronary artery without angina pectoris: Secondary | ICD-10-CM | POA: Diagnosis not present

## 2020-05-05 DIAGNOSIS — I10 Essential (primary) hypertension: Secondary | ICD-10-CM | POA: Diagnosis not present

## 2020-05-05 DIAGNOSIS — Z9981 Dependence on supplemental oxygen: Secondary | ICD-10-CM | POA: Diagnosis not present

## 2020-05-05 DIAGNOSIS — U071 COVID-19: Secondary | ICD-10-CM | POA: Diagnosis not present

## 2020-05-07 DIAGNOSIS — I2510 Atherosclerotic heart disease of native coronary artery without angina pectoris: Secondary | ICD-10-CM | POA: Diagnosis not present

## 2020-05-07 DIAGNOSIS — I10 Essential (primary) hypertension: Secondary | ICD-10-CM | POA: Diagnosis not present

## 2020-05-07 DIAGNOSIS — U071 COVID-19: Secondary | ICD-10-CM | POA: Diagnosis not present

## 2020-05-07 DIAGNOSIS — Z9981 Dependence on supplemental oxygen: Secondary | ICD-10-CM | POA: Diagnosis not present

## 2020-05-07 DIAGNOSIS — M6281 Muscle weakness (generalized): Secondary | ICD-10-CM | POA: Diagnosis not present

## 2020-05-09 DIAGNOSIS — U071 COVID-19: Secondary | ICD-10-CM | POA: Diagnosis not present

## 2020-05-09 DIAGNOSIS — I2510 Atherosclerotic heart disease of native coronary artery without angina pectoris: Secondary | ICD-10-CM | POA: Diagnosis not present

## 2020-05-09 DIAGNOSIS — M6281 Muscle weakness (generalized): Secondary | ICD-10-CM | POA: Diagnosis not present

## 2020-05-09 DIAGNOSIS — I10 Essential (primary) hypertension: Secondary | ICD-10-CM | POA: Diagnosis not present

## 2020-05-09 DIAGNOSIS — Z9981 Dependence on supplemental oxygen: Secondary | ICD-10-CM | POA: Diagnosis not present

## 2020-05-12 DIAGNOSIS — M6281 Muscle weakness (generalized): Secondary | ICD-10-CM | POA: Diagnosis not present

## 2020-05-12 DIAGNOSIS — Z9981 Dependence on supplemental oxygen: Secondary | ICD-10-CM | POA: Diagnosis not present

## 2020-05-12 DIAGNOSIS — U071 COVID-19: Secondary | ICD-10-CM | POA: Diagnosis not present

## 2020-05-12 DIAGNOSIS — I10 Essential (primary) hypertension: Secondary | ICD-10-CM | POA: Diagnosis not present

## 2020-05-12 DIAGNOSIS — I2510 Atherosclerotic heart disease of native coronary artery without angina pectoris: Secondary | ICD-10-CM | POA: Diagnosis not present

## 2020-05-14 DIAGNOSIS — Z9981 Dependence on supplemental oxygen: Secondary | ICD-10-CM | POA: Diagnosis not present

## 2020-05-14 DIAGNOSIS — I10 Essential (primary) hypertension: Secondary | ICD-10-CM | POA: Diagnosis not present

## 2020-05-14 DIAGNOSIS — U071 COVID-19: Secondary | ICD-10-CM | POA: Diagnosis not present

## 2020-05-14 DIAGNOSIS — I2510 Atherosclerotic heart disease of native coronary artery without angina pectoris: Secondary | ICD-10-CM | POA: Diagnosis not present

## 2020-05-14 DIAGNOSIS — M6281 Muscle weakness (generalized): Secondary | ICD-10-CM | POA: Diagnosis not present

## 2020-05-16 DIAGNOSIS — N1831 Chronic kidney disease, stage 3a: Secondary | ICD-10-CM | POA: Diagnosis not present

## 2020-05-16 DIAGNOSIS — Z299 Encounter for prophylactic measures, unspecified: Secondary | ICD-10-CM | POA: Diagnosis not present

## 2020-05-16 DIAGNOSIS — U071 COVID-19: Secondary | ICD-10-CM | POA: Diagnosis not present

## 2020-05-16 DIAGNOSIS — I10 Essential (primary) hypertension: Secondary | ICD-10-CM | POA: Diagnosis not present

## 2020-05-16 DIAGNOSIS — J9690 Respiratory failure, unspecified, unspecified whether with hypoxia or hypercapnia: Secondary | ICD-10-CM | POA: Diagnosis not present

## 2020-05-16 DIAGNOSIS — Z9981 Dependence on supplemental oxygen: Secondary | ICD-10-CM | POA: Diagnosis not present

## 2020-05-16 DIAGNOSIS — M6281 Muscle weakness (generalized): Secondary | ICD-10-CM | POA: Diagnosis not present

## 2020-05-16 DIAGNOSIS — J1282 Pneumonia due to coronavirus disease 2019: Secondary | ICD-10-CM | POA: Diagnosis not present

## 2020-05-16 DIAGNOSIS — I2510 Atherosclerotic heart disease of native coronary artery without angina pectoris: Secondary | ICD-10-CM | POA: Diagnosis not present

## 2020-05-19 DIAGNOSIS — I10 Essential (primary) hypertension: Secondary | ICD-10-CM | POA: Diagnosis not present

## 2020-05-19 DIAGNOSIS — Z9981 Dependence on supplemental oxygen: Secondary | ICD-10-CM | POA: Diagnosis not present

## 2020-05-19 DIAGNOSIS — I2510 Atherosclerotic heart disease of native coronary artery without angina pectoris: Secondary | ICD-10-CM | POA: Diagnosis not present

## 2020-05-19 DIAGNOSIS — U071 COVID-19: Secondary | ICD-10-CM | POA: Diagnosis not present

## 2020-05-19 DIAGNOSIS — M6281 Muscle weakness (generalized): Secondary | ICD-10-CM | POA: Diagnosis not present

## 2020-05-23 DIAGNOSIS — I429 Cardiomyopathy, unspecified: Secondary | ICD-10-CM | POA: Diagnosis not present

## 2020-05-23 DIAGNOSIS — I739 Peripheral vascular disease, unspecified: Secondary | ICD-10-CM | POA: Diagnosis not present

## 2020-05-23 DIAGNOSIS — I10 Essential (primary) hypertension: Secondary | ICD-10-CM | POA: Diagnosis not present

## 2020-05-23 DIAGNOSIS — M6281 Muscle weakness (generalized): Secondary | ICD-10-CM | POA: Diagnosis not present

## 2020-05-23 DIAGNOSIS — K5900 Constipation, unspecified: Secondary | ICD-10-CM | POA: Diagnosis not present

## 2020-05-23 DIAGNOSIS — Z299 Encounter for prophylactic measures, unspecified: Secondary | ICD-10-CM | POA: Diagnosis not present

## 2020-06-02 DIAGNOSIS — Z23 Encounter for immunization: Secondary | ICD-10-CM | POA: Diagnosis not present

## 2020-06-10 DIAGNOSIS — K219 Gastro-esophageal reflux disease without esophagitis: Secondary | ICD-10-CM | POA: Diagnosis not present

## 2020-06-10 DIAGNOSIS — I10 Essential (primary) hypertension: Secondary | ICD-10-CM | POA: Diagnosis not present

## 2020-08-11 DIAGNOSIS — K219 Gastro-esophageal reflux disease without esophagitis: Secondary | ICD-10-CM | POA: Diagnosis not present

## 2020-08-11 DIAGNOSIS — I10 Essential (primary) hypertension: Secondary | ICD-10-CM | POA: Diagnosis not present

## 2020-08-28 DIAGNOSIS — I10 Essential (primary) hypertension: Secondary | ICD-10-CM | POA: Diagnosis not present

## 2020-08-28 DIAGNOSIS — Z299 Encounter for prophylactic measures, unspecified: Secondary | ICD-10-CM | POA: Diagnosis not present

## 2020-08-28 DIAGNOSIS — I739 Peripheral vascular disease, unspecified: Secondary | ICD-10-CM | POA: Diagnosis not present

## 2020-08-28 DIAGNOSIS — I429 Cardiomyopathy, unspecified: Secondary | ICD-10-CM | POA: Diagnosis not present

## 2020-09-01 DIAGNOSIS — H353132 Nonexudative age-related macular degeneration, bilateral, intermediate dry stage: Secondary | ICD-10-CM | POA: Diagnosis not present

## 2020-09-08 DIAGNOSIS — I10 Essential (primary) hypertension: Secondary | ICD-10-CM | POA: Diagnosis not present

## 2020-09-08 DIAGNOSIS — K219 Gastro-esophageal reflux disease without esophagitis: Secondary | ICD-10-CM | POA: Diagnosis not present

## 2020-10-08 DIAGNOSIS — I10 Essential (primary) hypertension: Secondary | ICD-10-CM | POA: Diagnosis not present

## 2020-10-08 DIAGNOSIS — K219 Gastro-esophageal reflux disease without esophagitis: Secondary | ICD-10-CM | POA: Diagnosis not present

## 2020-10-21 DIAGNOSIS — I429 Cardiomyopathy, unspecified: Secondary | ICD-10-CM | POA: Diagnosis not present

## 2020-10-21 DIAGNOSIS — Z299 Encounter for prophylactic measures, unspecified: Secondary | ICD-10-CM | POA: Diagnosis not present

## 2020-10-21 DIAGNOSIS — Z789 Other specified health status: Secondary | ICD-10-CM | POA: Diagnosis not present

## 2020-10-21 DIAGNOSIS — I10 Essential (primary) hypertension: Secondary | ICD-10-CM | POA: Diagnosis not present

## 2020-10-21 DIAGNOSIS — K5900 Constipation, unspecified: Secondary | ICD-10-CM | POA: Diagnosis not present

## 2020-10-21 DIAGNOSIS — M533 Sacrococcygeal disorders, not elsewhere classified: Secondary | ICD-10-CM | POA: Diagnosis not present

## 2020-11-03 DIAGNOSIS — I10 Essential (primary) hypertension: Secondary | ICD-10-CM | POA: Diagnosis not present

## 2020-11-03 DIAGNOSIS — Z7189 Other specified counseling: Secondary | ICD-10-CM | POA: Diagnosis not present

## 2020-11-03 DIAGNOSIS — E7800 Pure hypercholesterolemia, unspecified: Secondary | ICD-10-CM | POA: Diagnosis not present

## 2020-11-03 DIAGNOSIS — Z79899 Other long term (current) drug therapy: Secondary | ICD-10-CM | POA: Diagnosis not present

## 2020-11-03 DIAGNOSIS — R5383 Other fatigue: Secondary | ICD-10-CM | POA: Diagnosis not present

## 2020-11-03 DIAGNOSIS — Z299 Encounter for prophylactic measures, unspecified: Secondary | ICD-10-CM | POA: Diagnosis not present

## 2020-11-03 DIAGNOSIS — Z0000 Encounter for general adult medical examination without abnormal findings: Secondary | ICD-10-CM | POA: Diagnosis not present

## 2020-11-04 DIAGNOSIS — E7800 Pure hypercholesterolemia, unspecified: Secondary | ICD-10-CM | POA: Diagnosis not present

## 2020-11-04 DIAGNOSIS — Z79899 Other long term (current) drug therapy: Secondary | ICD-10-CM | POA: Diagnosis not present

## 2020-11-04 DIAGNOSIS — R5383 Other fatigue: Secondary | ICD-10-CM | POA: Diagnosis not present

## 2020-11-08 DIAGNOSIS — I10 Essential (primary) hypertension: Secondary | ICD-10-CM | POA: Diagnosis not present

## 2020-11-08 DIAGNOSIS — E7849 Other hyperlipidemia: Secondary | ICD-10-CM | POA: Diagnosis not present

## 2020-11-08 DIAGNOSIS — E1165 Type 2 diabetes mellitus with hyperglycemia: Secondary | ICD-10-CM | POA: Diagnosis not present

## 2020-12-22 DIAGNOSIS — I10 Essential (primary) hypertension: Secondary | ICD-10-CM | POA: Diagnosis not present

## 2020-12-22 DIAGNOSIS — Z299 Encounter for prophylactic measures, unspecified: Secondary | ICD-10-CM | POA: Diagnosis not present

## 2020-12-22 DIAGNOSIS — R600 Localized edema: Secondary | ICD-10-CM | POA: Diagnosis not present

## 2021-01-08 DIAGNOSIS — I10 Essential (primary) hypertension: Secondary | ICD-10-CM | POA: Diagnosis not present

## 2021-01-08 DIAGNOSIS — E7849 Other hyperlipidemia: Secondary | ICD-10-CM | POA: Diagnosis not present

## 2021-01-08 DIAGNOSIS — E1165 Type 2 diabetes mellitus with hyperglycemia: Secondary | ICD-10-CM | POA: Diagnosis not present

## 2021-02-16 DIAGNOSIS — H524 Presbyopia: Secondary | ICD-10-CM | POA: Diagnosis not present

## 2021-02-16 DIAGNOSIS — H353132 Nonexudative age-related macular degeneration, bilateral, intermediate dry stage: Secondary | ICD-10-CM | POA: Diagnosis not present

## 2021-02-23 IMAGING — DX DG CHEST 1V PORT
1 series · 1 of 1 positions shown · non-contrast
Comparison: 10/20/2017.

CLINICAL DATA: Chest pain.

EXAM:
PORTABLE CHEST 1 VIEW

[chest ap]
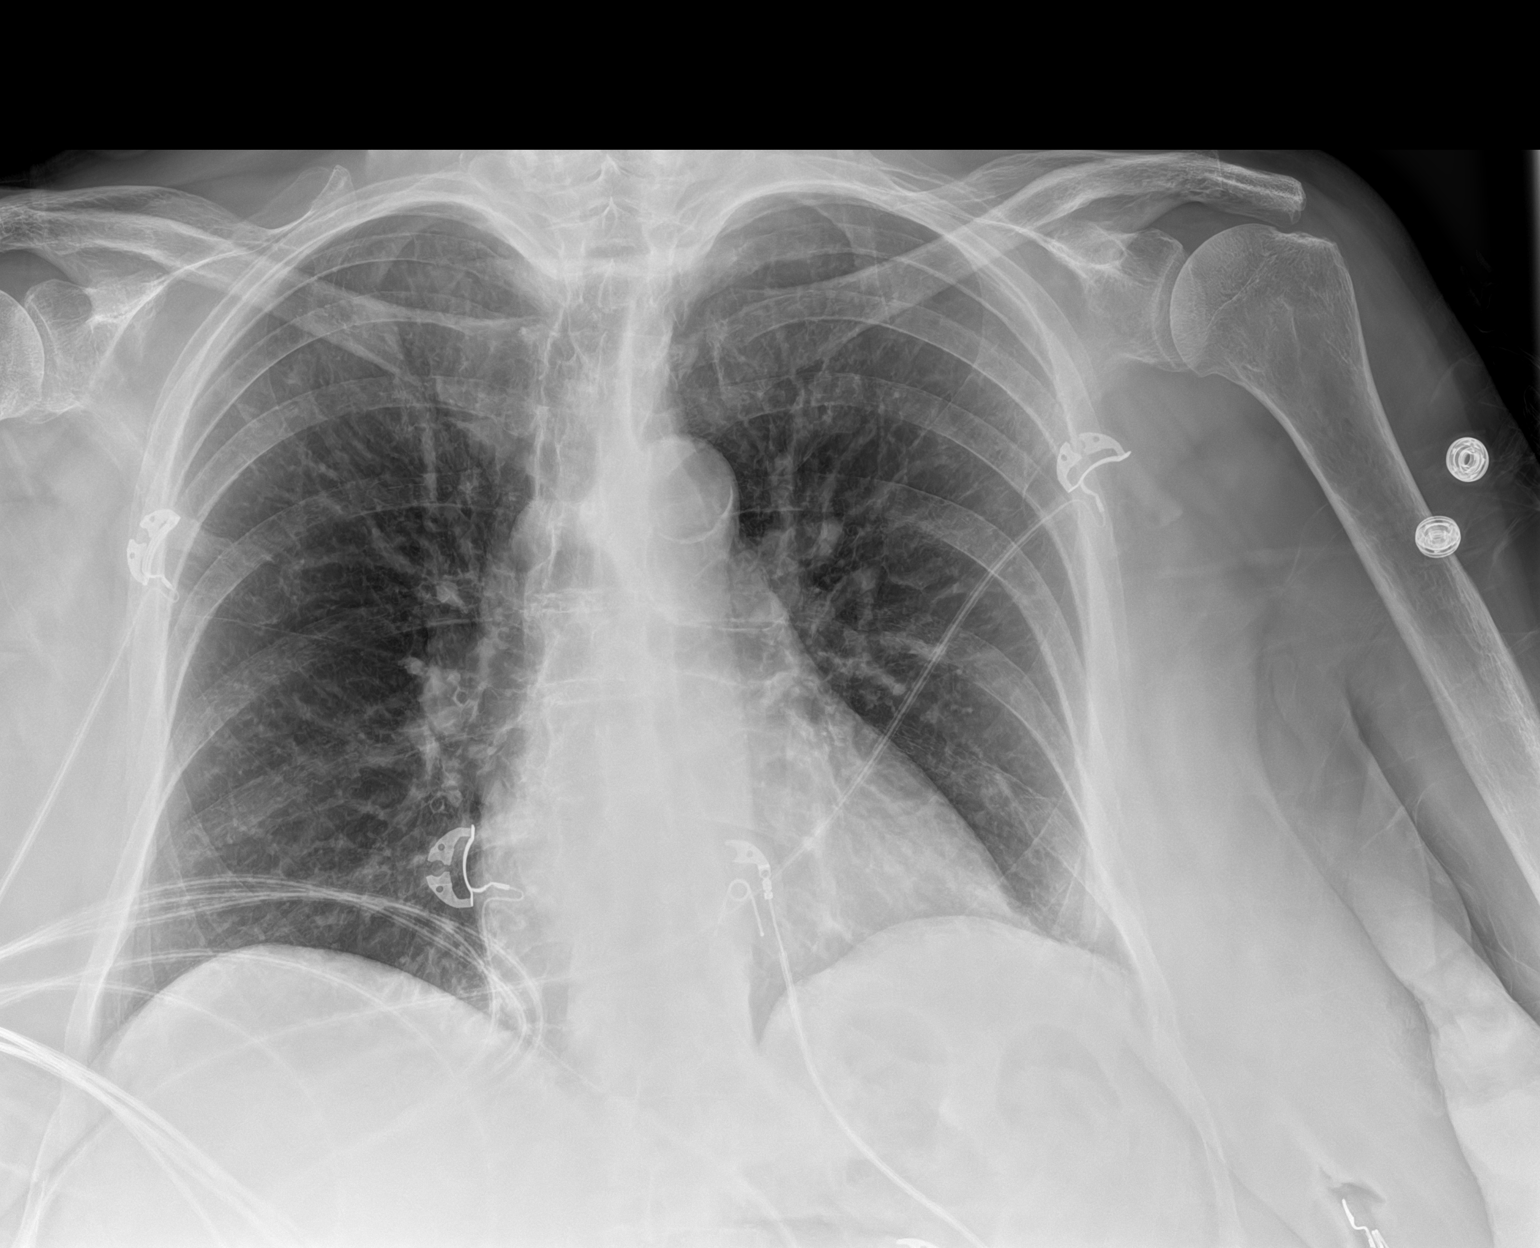

[1 of 1 positions shown; findings below may reference images not displayed]

FINDINGS: Mediastinum and hilar structures normal. Low lung volumes. Mild
bibasilar subsegmental atelectasis. No pleural effusion or
pneumothorax. Heart size normal. No acute bony abnormality
identified.
IMPRESSION: Low lung volumes with mild bibasilar subsegmental atelectasis.

## 2021-03-23 DIAGNOSIS — Z23 Encounter for immunization: Secondary | ICD-10-CM | POA: Diagnosis not present

## 2021-03-27 DIAGNOSIS — R109 Unspecified abdominal pain: Secondary | ICD-10-CM | POA: Diagnosis not present

## 2021-03-27 DIAGNOSIS — Z299 Encounter for prophylactic measures, unspecified: Secondary | ICD-10-CM | POA: Diagnosis not present

## 2021-03-27 DIAGNOSIS — I429 Cardiomyopathy, unspecified: Secondary | ICD-10-CM | POA: Diagnosis not present

## 2021-03-27 DIAGNOSIS — I10 Essential (primary) hypertension: Secondary | ICD-10-CM | POA: Diagnosis not present

## 2021-03-27 DIAGNOSIS — K5900 Constipation, unspecified: Secondary | ICD-10-CM | POA: Diagnosis not present

## 2021-04-06 ENCOUNTER — Other Ambulatory Visit: Payer: Self-pay | Admitting: Family Medicine

## 2021-04-06 DIAGNOSIS — D179 Benign lipomatous neoplasm, unspecified: Secondary | ICD-10-CM

## 2021-04-10 DIAGNOSIS — E7800 Pure hypercholesterolemia, unspecified: Secondary | ICD-10-CM | POA: Diagnosis not present

## 2021-04-10 DIAGNOSIS — I10 Essential (primary) hypertension: Secondary | ICD-10-CM | POA: Diagnosis not present

## 2021-04-21 ENCOUNTER — Encounter: Payer: Self-pay | Admitting: General Surgery

## 2021-04-21 ENCOUNTER — Ambulatory Visit: Payer: Medicare Other | Admitting: General Surgery

## 2021-04-21 ENCOUNTER — Other Ambulatory Visit: Payer: Self-pay

## 2021-04-21 VITALS — BP 167/84 | HR 93 | Temp 97.0°F | Resp 14 | Ht 65.0 in | Wt 165.0 lb

## 2021-04-21 DIAGNOSIS — K4090 Unilateral inguinal hernia, without obstruction or gangrene, not specified as recurrent: Secondary | ICD-10-CM

## 2021-04-21 NOTE — Progress Notes (Signed)
Melissa Ross; [id number]; Aug 06, 1930   HPI Patient is an 87 year old white female who was referred to my care by Dr. Woody Seller for evaluation and treatment of right groin swelling.  She states that has been present for many years.  Recently it seems to have increased in size and causes her pain at the site.  She was referred for evaluation for possible lipoma versus hernia.  She denies any vomiting. Past Medical History:  Diagnosis Date   Arthritis    Asthma    GERD (gastroesophageal reflux disease)    Hypertension    Myocardial infarction ([place]) 03/2013   Respiratory failure [facility])    August 2016, requiring intubation   Ventral hernia     Past Surgical History:  Procedure Laterality Date   APPENDECTOMY     CATARACT EXTRACTION W/PHACO  11/09/2011   Procedure: CATARACT EXTRACTION PHACO AND INTRAOCULAR LENS PLACEMENT ([place]);  Surgeon: Elta Guadeloupe T. Gershon Crane, MD;  Location: [facility];  Service: Ophthalmology;  Laterality: Right;  CDE 10.01   CHOLECYSTECTOMY     COLONOSCOPY N/A 10/30/2015   Procedure: COLONOSCOPY;  Surgeon: Rogene Houston, MD;  [facility];  Service: Endoscopy;  Laterality: N/A;  2:20 - moved to 4/20 @ 1:25   ESOPHAGOGASTRODUODENOSCOPY (EGD) WITH ESOPHAGEAL DILATION  05/11/2012   Procedure: ESOPHAGOGASTRODUODENOSCOPY (EGD) WITH ESOPHAGEAL DILATION;  Surgeon: Rogene Houston, MD;  [facility];  Service: Endoscopy;  Laterality: N/A;   EYE SURGERY     left KPE   INSERTION OF MESH  07/17/2012   Procedure: INSERTION OF MESH;  Surgeon: Gayland Curry, MD,FACS;  Location: [place];  Service: General;  Laterality: N/A;   LEFT HEART CATHETERIZATION WITH CORONARY ANGIOGRAM N/A 03/19/2013   Procedure: LEFT HEART CATHETERIZATION WITH CORONARY ANGIOGRAM;  Surgeon: Clent Demark, MD;  Location: [place] CATH LAB;  Service: Cardiovascular;  Laterality: N/A;   TOE SURGERY     right foot   TONSILLECTOMY     TUBAL LIGATION     VENTRAL HERNIA REPAIR  07/17/2012   Procedure: LAPAROSCOPIC  VENTRAL HERNIA;  Surgeon: Gayland Curry, MD,FACS;  Location: [place];  Service: General;  Laterality: N/A;    Family History  Problem Relation Age of Onset   Heart disease Mother    Stroke Mother    Stroke Father    Heart disease Father    Hypertension Sister    Cancer Brother        liver   Breast cancer Other    Ovarian cancer Other    Colon cancer Other    Malignant hyperthermia Neg Hx    Pseudochol deficiency Neg Hx    Hypotension Neg Hx    Anesthesia problems Neg Hx     Current Outpatient Medications on File Prior to Visit  Medication Sig Dispense Refill   aspirin 81 MG chewable tablet Chew 1 tablet (81 mg total) by mouth daily. 30 tablet 3   atorvastatin (LIPITOR) 10 MG tablet Take 5 mg by mouth daily.  2   carvedilol (COREG) 3.125 MG tablet Take 3.125 mg by mouth 2 (two) times daily.     carvedilol (COREG) 6.25 MG tablet Take 6.25 mg by mouth 2 (two) times daily with a meal.     clopidogrel (PLAVIX) 75 MG tablet Take 75 mg by mouth daily.     levofloxacin (LEVAQUIN) 750 MG tablet Take 1 tablet (750 mg total) by mouth every other day. 5 tablet 0   lisinopril (ZESTRIL) 5 MG tablet Take  1 tablet (5 mg total) by mouth daily. 20 tablet 0   nitroGLYCERIN (NITROSTAT) 0.4 MG SL tablet Place 1 tablet (0.4 mg total) under the tongue every 5 (five) minutes x 3 doses as needed for chest pain. 25 tablet 12   pantoprazole (PROTONIX) 40 MG tablet Take 1 tablet (40 mg total) by mouth daily. 30 tablet 0   triamcinolone cream (KENALOG) 0.1 % Apply 1 application topically 2 (two) times daily as needed.     UNABLE TO FIND Muscle Cramp     [DISCONTINUED] famotidine (PEPCID) 20 MG tablet Take 1 tablet (20 mg total) by mouth 2 (two) times daily. (Patient taking differently: Take 20 mg by mouth daily.) 60 tablet 3   No current facility-administered medications on file prior to visit.    Allergies  Allergen Reactions   Neosporin [Neomycin-Bacitracin Zn-Polymyx] Other (See Comments)    blisters    Penicillins Swelling and Rash    Has patient had a PCN reaction causing immediate rash, facial/tongue/throat swelling, SOB or lightheadedness with hypotension:unknown Has patient had a PCN reaction causing severe rash involving mucus membranes or skin necrosis: unknown Has patient had a PCN reaction that required hospitalization: unknown Has patient had a PCN reaction occurring within the last 10 years: unknown If all of the above answers are "NO", then may proceed with Cephalosporin use.      Social History   Substance and Sexual Activity  Alcohol Use No    Social History   Tobacco Use  Smoking Status Never  Smokeless Tobacco Never    Review of Systems  Constitutional:  Positive for malaise/fatigue.  HENT: Negative.    Eyes:  Positive for pain.  Respiratory:  Positive for cough.   Cardiovascular: Negative.   Gastrointestinal:  Positive for nausea.  Genitourinary:  Positive for frequency.  Musculoskeletal: Negative.   Skin: Negative.   Neurological: Negative.   Endo/Heme/Allergies: Negative.   Psychiatric/Behavioral: Negative.     Objective   Vitals:   04/21/21 1400  BP: (!) 167/84  Pulse: 93  Resp: 14  Temp: (!) 97 F (36.1 C)  SpO2: 98%    Physical Exam Vitals reviewed.  Constitutional:      Appearance: Normal appearance. She is not ill-appearing.  HENT:     Head: Normocephalic and atraumatic.  Cardiovascular:     Rate and Rhythm: Normal rate and regular rhythm.     Heart sounds: Normal heart sounds. No murmur heard.   No friction rub. No gallop.  Pulmonary:     Effort: Pulmonary effort is normal. No respiratory distress.     Breath sounds: Normal breath sounds. No stridor. No wheezing, rhonchi or rales.  Abdominal:     General: Bowel sounds are normal. There is no distension.     Palpations: Abdomen is soft. There is no mass.     Tenderness: There is no abdominal tenderness. There is no guarding or rebound.     Hernia: A hernia is present.      Comments: Reducible right inguinal hernia noted.  Skin:    General: Skin is warm and dry.  Neurological:     Mental Status: She is alert and oriented to person, place, and time.     Gait: Gait abnormal.     Comments: Gait somewhat unsteady.    Assessment  Right inguinal hernia, easily reducible Multiple comorbidities Plan  I told the patient that I would not recommend a right inguinal herniorrhaphy at this time given her multiple comorbidities and the fact  that she is still getting around despite having this for many years.  Risk of incarceration is low.  She understands and agrees.  I told her to return to my care should any problems arise.  Follow-up here as needed.

## 2021-05-04 DIAGNOSIS — I10 Essential (primary) hypertension: Secondary | ICD-10-CM | POA: Diagnosis not present

## 2021-05-04 DIAGNOSIS — Z299 Encounter for prophylactic measures, unspecified: Secondary | ICD-10-CM | POA: Diagnosis not present

## 2021-05-04 DIAGNOSIS — R109 Unspecified abdominal pain: Secondary | ICD-10-CM | POA: Diagnosis not present

## 2021-05-04 DIAGNOSIS — K219 Gastro-esophageal reflux disease without esophagitis: Secondary | ICD-10-CM | POA: Diagnosis not present

## 2021-05-04 DIAGNOSIS — E860 Dehydration: Secondary | ICD-10-CM | POA: Diagnosis not present

## 2021-08-20 DIAGNOSIS — R110 Nausea: Secondary | ICD-10-CM | POA: Diagnosis not present

## 2021-08-20 DIAGNOSIS — R198 Other specified symptoms and signs involving the digestive system and abdomen: Secondary | ICD-10-CM | POA: Diagnosis not present

## 2021-08-20 DIAGNOSIS — Z8719 Personal history of other diseases of the digestive system: Secondary | ICD-10-CM | POA: Diagnosis not present

## 2021-08-20 DIAGNOSIS — Z9889 Other specified postprocedural states: Secondary | ICD-10-CM | POA: Diagnosis not present

## 2021-08-20 DIAGNOSIS — K5909 Other constipation: Secondary | ICD-10-CM | POA: Diagnosis not present

## 2021-08-20 DIAGNOSIS — K4090 Unilateral inguinal hernia, without obstruction or gangrene, not specified as recurrent: Secondary | ICD-10-CM | POA: Diagnosis not present

## 2021-08-21 ENCOUNTER — Other Ambulatory Visit: Payer: Self-pay | Admitting: General Surgery

## 2021-08-21 DIAGNOSIS — K4090 Unilateral inguinal hernia, without obstruction or gangrene, not specified as recurrent: Secondary | ICD-10-CM

## 2021-08-21 DIAGNOSIS — K5909 Other constipation: Secondary | ICD-10-CM

## 2021-08-21 DIAGNOSIS — R198 Other specified symptoms and signs involving the digestive system and abdomen: Secondary | ICD-10-CM

## 2021-08-21 DIAGNOSIS — Z8719 Personal history of other diseases of the digestive system: Secondary | ICD-10-CM

## 2021-08-21 DIAGNOSIS — R110 Nausea: Secondary | ICD-10-CM

## 2021-08-26 ENCOUNTER — Emergency Department (HOSPITAL_COMMUNITY)
Admission: EM | Admit: 2021-08-26 | Discharge: 2021-08-26 | Disposition: A | Discharge status: Home / Self Care | Payer: Medicare Other | Attending: Emergency Medicine | Admitting: Emergency Medicine

## 2021-08-26 ENCOUNTER — Emergency Department (HOSPITAL_COMMUNITY): Payer: Medicare Other

## 2021-08-26 ENCOUNTER — Encounter (HOSPITAL_COMMUNITY): Payer: Self-pay | Admitting: *Deleted

## 2021-08-26 ENCOUNTER — Other Ambulatory Visit: Payer: Self-pay

## 2021-08-26 DIAGNOSIS — Z7982 Long term (current) use of aspirin: Secondary | ICD-10-CM | POA: Insufficient documentation

## 2021-08-26 DIAGNOSIS — I700 Atherosclerosis of aorta: Secondary | ICD-10-CM | POA: Diagnosis not present

## 2021-08-26 DIAGNOSIS — R1110 Vomiting, unspecified: Secondary | ICD-10-CM | POA: Diagnosis not present

## 2021-08-26 DIAGNOSIS — R531 Weakness: Secondary | ICD-10-CM | POA: Diagnosis not present

## 2021-08-26 DIAGNOSIS — E860 Dehydration: Secondary | ICD-10-CM | POA: Diagnosis not present

## 2021-08-26 DIAGNOSIS — R109 Unspecified abdominal pain: Secondary | ICD-10-CM | POA: Diagnosis not present

## 2021-08-26 DIAGNOSIS — R0600 Dyspnea, unspecified: Secondary | ICD-10-CM | POA: Insufficient documentation

## 2021-08-26 DIAGNOSIS — R1084 Generalized abdominal pain: Secondary | ICD-10-CM | POA: Insufficient documentation

## 2021-08-26 DIAGNOSIS — R112 Nausea with vomiting, unspecified: Secondary | ICD-10-CM | POA: Diagnosis not present

## 2021-08-26 DIAGNOSIS — R079 Chest pain, unspecified: Secondary | ICD-10-CM | POA: Insufficient documentation

## 2021-08-26 DIAGNOSIS — Z7902 Long term (current) use of antithrombotics/antiplatelets: Secondary | ICD-10-CM | POA: Diagnosis not present

## 2021-08-26 DIAGNOSIS — N390 Urinary tract infection, site not specified: Secondary | ICD-10-CM | POA: Diagnosis not present

## 2021-08-26 DIAGNOSIS — R0789 Other chest pain: Secondary | ICD-10-CM | POA: Diagnosis not present

## 2021-08-26 DIAGNOSIS — I10 Essential (primary) hypertension: Secondary | ICD-10-CM | POA: Diagnosis not present

## 2021-08-26 LAB — CBC WITH DIFFERENTIAL/PLATELET
Abs Immature Granulocytes: 0 10*3/uL (ref 0.00–0.07)
Basophils Absolute: 0 10*3/uL (ref 0.0–0.1)
Basophils Relative: 0 %
Eosinophils Absolute: 0.1 10*3/uL (ref 0.0–0.5)
Eosinophils Relative: 1 %
HCT: 43.7 % (ref 36.0–46.0)
Hemoglobin: 14.5 g/dL (ref 12.0–15.0)
Immature Granulocytes: 0 %
Lymphocytes Relative: 31 %
Lymphs Abs: 1.7 10*3/uL (ref 0.7–4.0)
MCH: 31.5 pg (ref 26.0–34.0)
MCHC: 33.2 g/dL (ref 30.0–36.0)
MCV: 95 fL (ref 80.0–100.0)
Monocytes Absolute: 0.4 10*3/uL (ref 0.1–1.0)
Monocytes Relative: 8 %
Neutro Abs: 3.4 10*3/uL (ref 1.7–7.7)
Neutrophils Relative %: 60 %
Platelets: 283 10*3/uL (ref 150–400)
RBC: 4.6 MIL/uL (ref 3.87–5.11)
RDW: 13 % (ref 11.5–15.5)
WBC: 5.6 10*3/uL (ref 4.0–10.5)
nRBC: 0 % (ref 0.0–0.2)

## 2021-08-26 LAB — URINALYSIS, ROUTINE W REFLEX MICROSCOPIC
Bilirubin Urine: NEGATIVE
Glucose, UA: NEGATIVE mg/dL
Hgb urine dipstick: NEGATIVE
Ketones, ur: 5 mg/dL — AB
Nitrite: NEGATIVE
Protein, ur: NEGATIVE mg/dL
Specific Gravity, Urine: 1.004 — ABNORMAL LOW (ref 1.005–1.030)
pH: 8 (ref 5.0–8.0)

## 2021-08-26 LAB — COMPREHENSIVE METABOLIC PANEL
ALT: 16 U/L (ref 0–44)
AST: 23 U/L (ref 15–41)
Albumin: 4.3 g/dL (ref 3.5–5.0)
Alkaline Phosphatase: 156 U/L — ABNORMAL HIGH (ref 38–126)
Anion gap: 13 (ref 5–15)
BUN: 19 mg/dL (ref 8–23)
CO2: 23 mmol/L (ref 22–32)
Calcium: 9.5 mg/dL (ref 8.9–10.3)
Chloride: 101 mmol/L (ref 98–111)
Creatinine, Ser: 0.97 mg/dL (ref 0.44–1.00)
GFR, Estimated: 56 mL/min — ABNORMAL LOW (ref >60)
Glucose, Bld: 98 mg/dL (ref 70–99)
Potassium: 3.8 mmol/L (ref 3.5–5.1)
Sodium: 137 mmol/L (ref 135–145)
Total Bilirubin: 0.9 mg/dL (ref 0.3–1.2)
Total Protein: 7 g/dL (ref 6.5–8.1)

## 2021-08-26 LAB — TROPONIN I (HIGH SENSITIVITY): Troponin I (High Sensitivity): 5 ng/L (ref <18)

## 2021-08-26 LAB — LIPASE, BLOOD: Lipase: 39 U/L (ref 11–51)

## 2021-08-26 MED ORDER — SODIUM CHLORIDE 0.9 % IV SOLN
1.0000 g | Freq: Once | INTRAVENOUS | Status: AC
  Administered 2021-08-26: 1 g via INTRAVENOUS
  Filled 2021-08-26: qty 10

## 2021-08-26 MED ORDER — SODIUM CHLORIDE 0.9 % IV BOLUS
500.0000 mL | Freq: Once | INTRAVENOUS | Status: AC
  Administered 2021-08-26: 500 mL via INTRAVENOUS

## 2021-08-26 MED ORDER — CEPHALEXIN 500 MG PO CAPS: 500.0000 mg | ORAL_CAPSULE | Freq: Three times a day (TID) | ORAL | 0 refills | Status: DC

## 2021-08-26 MED ORDER — PANTOPRAZOLE SODIUM 40 MG IV SOLR
40.0000 mg | Freq: Once | INTRAVENOUS | Status: AC
  Administered 2021-08-26: 40 mg via INTRAVENOUS
  Filled 2021-08-26: qty 10

## 2021-08-26 MED ORDER — IOHEXOL 300 MG/ML  SOLN
100.0000 mL | Freq: Once | INTRAMUSCULAR | Status: AC | PRN
  Administered 2021-08-26: 100 mL via INTRAVENOUS

## 2021-08-26 MED ORDER — ONDANSETRON 8 MG PO TBDP: 8.0000 mg | ORAL_TABLET | Freq: Three times a day (TID) | ORAL | 0 refills | Status: DC | PRN

## 2021-08-26 NOTE — ED Notes (Signed)
Pt transported to CT ?

## 2021-08-26 NOTE — ED Notes (Signed)
Went over d/c papers. All questions answered. Wheeled to car with family.

## 2021-08-26 NOTE — Discharge Instructions (Addendum)
It was our pleasure to provide your ER care today - we hope that you feel better.  Your ct scan was read as showing: IMPRESSION: 1. Mild wall thickening in the mid stomach. Gastritis would be a consideration. 2. Small hiatal hernia. 3. Diffuse colonic diverticulosis without diverticulitis. 4. Aortic Atherosclerosis (ICD10-I70.0).   Your lab tests show a possible urine infection - take antibiotic as prescribed. Drink plenty of fluids/stay well hydrated. Take zofran as need for nausea.  Take antibiotic, as prescribed, for possible urine infection.  Take pepcid or maalox as need if GI symptoms.   Follow up with primary care doctor in 1-2 days if symptoms fail to improve/resolve.  Return to ER if worse, new symptoms, high fevers, persistent vomiting, severe abdominal pain, increased trouble breathing, recurrent or persistent chest pain, or other concern.

## 2021-08-26 NOTE — ED Provider Notes (Signed)
Accident Provider Note   CSN: [id number] Arrival date & time: 08/26/21  1308     History  Chief Complaint  Patient presents with   Emesis    Melissa Ross is a 88 y.o. female.  HPI 88 year old female presents with vomiting and chest pain.  She has been dealing with vomiting for many months if not a year.  She been having abdominal pain as well.  Has a right inguinal hernia and recently saw her general surgeon, Dr. Redmond Pulling, who has ordered an outpatient CT scan due to vomiting and concern for possible recurrent hiatal hernia.  She had a hiatal hernia repair many years ago.  However last night and/or this morning she had some chest pain which got her scared.  She tells me she has had chest pain recently but it was worse.  She seems particularly concerned because her watch recorded some high heart rates and she is worried that she might have been in atrial fibrillation, though it gave no alert to this.  No current chest pain.  She has chronic dyspnea that is unchanged.  She has been having bowel movements, especially since being put on MiraLAX by Dr. Redmond Pulling.  Mostly she vomits clear liquid.  Home Medications Prior to Admission medications   Medication Sig Start Date End Date Taking? Authorizing Provider  aspirin 81 MG chewable tablet Chew 1 tablet (81 mg total) by mouth daily. 11/04/15   Rehman, Mechele Dawley, MD  atorvastatin (LIPITOR) 10 MG tablet Take 5 mg by mouth daily. 09/02/15   [provider]  carvedilol (COREG) 3.125 MG tablet Take 3.125 mg by mouth 2 (two) times daily. 12/08/19   [provider]  carvedilol (COREG) 6.25 MG tablet Take 6.25 mg by mouth 2 (two) times daily with a meal.    [provider]  clopidogrel (PLAVIX) 75 MG tablet Take 75 mg by mouth daily.    [provider]  levofloxacin (LEVAQUIN) 750 MG tablet Take 1 tablet (750 mg total) by mouth every other day. 09/27/17   Timmothy Euler, MD  lisinopril  (ZESTRIL) 5 MG tablet Take 1 tablet (5 mg total) by mouth daily. 12/28/19   Triplett, Tammy, PA-C  nitroGLYCERIN (NITROSTAT) 0.4 MG SL tablet Place 1 tablet (0.4 mg total) under the tongue every 5 (five) minutes x 3 doses as needed for chest pain. 03/20/13   Charolette Forward, MD  pantoprazole (PROTONIX) 40 MG tablet Take 1 tablet (40 mg total) by mouth daily. 12/28/19   Triplett, Tammy, PA-C  triamcinolone cream (KENALOG) 0.1 % Apply 1 application topically 2 (two) times daily as needed. 07/02/19   [provider]  UNABLE TO FIND Muscle Cramp    [provider]  famotidine (PEPCID) 20 MG tablet Take 1 tablet (20 mg total) by mouth 2 (two) times daily. Patient taking differently: Take 20 mg by mouth daily. 03/20/13 12/28/19  Charolette Forward, MD      Allergies    Neosporin [neomycin-bacitracin zn-polymyx] and Penicillins    Review of Systems   Review of Systems  Constitutional:  Negative for fever.  Respiratory:  Positive for shortness of breath (chronic).   Cardiovascular:  Positive for chest pain.  Gastrointestinal:  Positive for abdominal distention, abdominal pain and vomiting. Negative for constipation.   Physical Exam Updated Vital Signs BP (!) 168/75    Pulse 82    Temp 98.4 F (36.9 C) (Oral)    Resp 16    Ht 5\' 5"  (1.651  m)    Wt 70.8 kg    SpO2 100%    BMI 25.96 kg/m  Physical Exam Vitals and nursing note reviewed.  Constitutional:      Appearance: She is well-developed.  HENT:     Head: Normocephalic and atraumatic.  Cardiovascular:     Rate and Rhythm: Normal rate and regular rhythm.     Heart sounds: Normal heart sounds.  Pulmonary:     Effort: Pulmonary effort is normal.     Breath sounds: Normal breath sounds.  Abdominal:     Palpations: Abdomen is soft.     Tenderness: There is generalized abdominal tenderness.     Hernia: A hernia is present. Hernia is present in the ventral area.     Comments: She has generalized tenderness and often exaggerated  responses to palpation.  Due to this is hard to assess for a right inguinal hernia.  I see no obvious incarcerated hernia.  Skin:    General: Skin is warm and dry.  Neurological:     Mental Status: She is alert.    ED Results / Procedures / Treatments   Labs (all labs ordered are listed, but only abnormal results are displayed) Labs Reviewed  COMPREHENSIVE METABOLIC PANEL - Abnormal; Notable for the following components:      Result Value   Alkaline Phosphatase 156 (*)    GFR, Estimated 56 (*)    All other components within normal limits  URINALYSIS, ROUTINE W REFLEX MICROSCOPIC - Abnormal; Notable for the following components:   Color, Urine STRAW (*)    Specific Gravity, Urine 1.004 (*)    Ketones, ur 5 (*)    Leukocytes,Ua SMALL (*)    Bacteria, UA MANY (*)    All other components within normal limits  LIPASE, BLOOD  CBC WITH DIFFERENTIAL/PLATELET  TROPONIN I (HIGH SENSITIVITY)    EKG EKG Interpretation  Date/Time:  Wednesday August 26 2021 14:27:15 EST Ventricular Rate:  92 PR Interval:  173 QRS Duration: 89 QT Interval:  353 QTC Calculation: 437 R Axis:   -12 Text Interpretation: Sinus rhythm Low voltage, precordial leads Abnormal inferior Q waves Confirmed by Sherwood Gambler [phone]) on 08/26/2021 2:49:43 PM  Radiology DG Chest Portable 1 View  Result Date: 08/26/2021 CLINICAL DATA:  Mid chest pain, weakness EXAM: PORTABLE CHEST 1 VIEW COMPARISON:  03/16/2020 FINDINGS: Single frontal view of the chest demonstrates an unremarkable cardiac silhouette. No airspace disease, effusion, or pneumothorax. No acute bony abnormalities. IMPRESSION: 1. No acute intrathoracic process. Electronically Signed   By: Randa Ngo M.D.   On: 08/26/2021 15:01    Procedures Procedures    Medications Ordered in ED Medications  sodium chloride 0.9 % bolus 500 mL (500 mLs Intravenous New Bag/Given 08/26/21 1426)  iohexol (OMNIPAQUE) 300 MG/ML solution 100 mL (100 mLs Intravenous  Contrast Given 08/26/21 1527)    ED Course/ Medical Decision Making/ A&P                           Medical Decision Making Amount and/or Complexity of Data Reviewed Labs: ordered. Radiology: ordered.  Risk Prescription drug management.   Given longstanding history of vomiting, as well as her abdominal complaints she will need a CT of the abdomen and pelvis.  Chest x-ray obtained given the transient chest pain which I think is pretty atypical and if her second troponin is negative I do not think further emergent cardiac work-up is needed.  Initial troponin and  ECG are unremarkable.  I have personally reviewed the ECG image.  No obvious ischemia.  Chest x-ray images viewed by myself and shows no pneumonia or CHF.  Initial labs show no acute kidney injury or significant LFT dysfunction/pancreatitis.  CT scan is currently pending.  Care transferred to Dr. Ashok Cordia.         Final Clinical Impression(s) / ED Diagnoses Final diagnoses:  None    Rx / DC Orders ED Discharge Orders     None         Sherwood Gambler, MD 08/26/21 1549

## 2021-08-26 NOTE — ED Notes (Signed)
This RN followed up with the MD re: pts concerns, per MD the pt and family had been updated, this RN f/u with the pt and family and started abx

## 2021-08-26 NOTE — ED Notes (Signed)
Pts family requesting to speak with the MD

## 2021-08-26 NOTE — ED Triage Notes (Signed)
Pt brought in by [facility] from home with c/o constant vomiting x 3 months. Pt also c/o mid chest pain and weakness. Pt had a hiatal hernia surgery and went to see her surgeon who has ordered for pt to have a CT scan. The scan hasn't been done yet. Denies fever, diarrhea. SBP 190, HR 100, CBG 170 for EMS.

## 2021-08-27 DIAGNOSIS — K2970 Gastritis, unspecified, without bleeding: Secondary | ICD-10-CM | POA: Diagnosis not present

## 2021-08-27 DIAGNOSIS — I429 Cardiomyopathy, unspecified: Secondary | ICD-10-CM | POA: Diagnosis not present

## 2021-08-27 DIAGNOSIS — Z299 Encounter for prophylactic measures, unspecified: Secondary | ICD-10-CM | POA: Diagnosis not present

## 2021-08-27 DIAGNOSIS — I700 Atherosclerosis of aorta: Secondary | ICD-10-CM | POA: Diagnosis not present

## 2021-08-27 DIAGNOSIS — Z789 Other specified health status: Secondary | ICD-10-CM | POA: Diagnosis not present

## 2021-08-27 DIAGNOSIS — I10 Essential (primary) hypertension: Secondary | ICD-10-CM | POA: Diagnosis not present

## 2021-09-03 DIAGNOSIS — K219 Gastro-esophageal reflux disease without esophagitis: Secondary | ICD-10-CM | POA: Diagnosis not present

## 2021-09-03 DIAGNOSIS — Z299 Encounter for prophylactic measures, unspecified: Secondary | ICD-10-CM | POA: Diagnosis not present

## 2021-09-03 DIAGNOSIS — N1831 Chronic kidney disease, stage 3a: Secondary | ICD-10-CM | POA: Diagnosis not present

## 2021-09-03 DIAGNOSIS — I739 Peripheral vascular disease, unspecified: Secondary | ICD-10-CM | POA: Diagnosis not present

## 2021-09-03 DIAGNOSIS — I10 Essential (primary) hypertension: Secondary | ICD-10-CM | POA: Diagnosis not present

## 2021-09-08 DIAGNOSIS — I10 Essential (primary) hypertension: Secondary | ICD-10-CM | POA: Diagnosis not present

## 2021-09-08 DIAGNOSIS — E7849 Other hyperlipidemia: Secondary | ICD-10-CM | POA: Diagnosis not present

## 2021-09-08 DIAGNOSIS — E1165 Type 2 diabetes mellitus with hyperglycemia: Secondary | ICD-10-CM | POA: Diagnosis not present

## 2021-09-10 DIAGNOSIS — K4090 Unilateral inguinal hernia, without obstruction or gangrene, not specified as recurrent: Secondary | ICD-10-CM | POA: Diagnosis not present

## 2021-09-10 DIAGNOSIS — K5909 Other constipation: Secondary | ICD-10-CM | POA: Diagnosis not present

## 2021-09-10 DIAGNOSIS — Z9889 Other specified postprocedural states: Secondary | ICD-10-CM | POA: Diagnosis not present

## 2021-09-10 DIAGNOSIS — R198 Other specified symptoms and signs involving the digestive system and abdomen: Secondary | ICD-10-CM | POA: Diagnosis not present

## 2021-09-10 DIAGNOSIS — K2970 Gastritis, unspecified, without bleeding: Secondary | ICD-10-CM | POA: Diagnosis not present

## 2021-09-10 DIAGNOSIS — Z8719 Personal history of other diseases of the digestive system: Secondary | ICD-10-CM | POA: Diagnosis not present

## 2021-09-10 DIAGNOSIS — K449 Diaphragmatic hernia without obstruction or gangrene: Secondary | ICD-10-CM | POA: Diagnosis not present

## 2021-10-07 DIAGNOSIS — K219 Gastro-esophageal reflux disease without esophagitis: Secondary | ICD-10-CM | POA: Diagnosis not present

## 2021-10-07 DIAGNOSIS — Z789 Other specified health status: Secondary | ICD-10-CM | POA: Diagnosis not present

## 2021-10-07 DIAGNOSIS — Z299 Encounter for prophylactic measures, unspecified: Secondary | ICD-10-CM | POA: Diagnosis not present

## 2021-10-07 DIAGNOSIS — K2970 Gastritis, unspecified, without bleeding: Secondary | ICD-10-CM | POA: Diagnosis not present

## 2021-10-07 DIAGNOSIS — I10 Essential (primary) hypertension: Secondary | ICD-10-CM | POA: Diagnosis not present

## 2021-10-12 DIAGNOSIS — I10 Essential (primary) hypertension: Secondary | ICD-10-CM | POA: Diagnosis not present

## 2021-10-12 DIAGNOSIS — Z299 Encounter for prophylactic measures, unspecified: Secondary | ICD-10-CM | POA: Diagnosis not present

## 2021-10-12 DIAGNOSIS — Z0000 Encounter for general adult medical examination without abnormal findings: Secondary | ICD-10-CM | POA: Diagnosis not present

## 2021-10-12 DIAGNOSIS — Z7189 Other specified counseling: Secondary | ICD-10-CM | POA: Diagnosis not present

## 2021-12-09 DIAGNOSIS — E1165 Type 2 diabetes mellitus with hyperglycemia: Secondary | ICD-10-CM | POA: Diagnosis not present

## 2021-12-09 DIAGNOSIS — E7849 Other hyperlipidemia: Secondary | ICD-10-CM | POA: Diagnosis not present

## 2021-12-09 DIAGNOSIS — I10 Essential (primary) hypertension: Secondary | ICD-10-CM | POA: Diagnosis not present

## 2021-12-31 DIAGNOSIS — K2970 Gastritis, unspecified, without bleeding: Secondary | ICD-10-CM | POA: Diagnosis not present

## 2021-12-31 DIAGNOSIS — Z299 Encounter for prophylactic measures, unspecified: Secondary | ICD-10-CM | POA: Diagnosis not present

## 2021-12-31 DIAGNOSIS — I10 Essential (primary) hypertension: Secondary | ICD-10-CM | POA: Diagnosis not present

## 2021-12-31 DIAGNOSIS — K219 Gastro-esophageal reflux disease without esophagitis: Secondary | ICD-10-CM | POA: Diagnosis not present

## 2022-02-17 DIAGNOSIS — Z299 Encounter for prophylactic measures, unspecified: Secondary | ICD-10-CM | POA: Diagnosis not present

## 2022-02-17 DIAGNOSIS — R5383 Other fatigue: Secondary | ICD-10-CM | POA: Diagnosis not present

## 2022-02-17 DIAGNOSIS — I10 Essential (primary) hypertension: Secondary | ICD-10-CM | POA: Diagnosis not present

## 2022-02-17 DIAGNOSIS — Z0000 Encounter for general adult medical examination without abnormal findings: Secondary | ICD-10-CM | POA: Diagnosis not present

## 2022-02-17 DIAGNOSIS — Z79899 Other long term (current) drug therapy: Secondary | ICD-10-CM | POA: Diagnosis not present

## 2022-02-17 DIAGNOSIS — E7800 Pure hypercholesterolemia, unspecified: Secondary | ICD-10-CM | POA: Diagnosis not present

## 2022-02-18 DIAGNOSIS — E7800 Pure hypercholesterolemia, unspecified: Secondary | ICD-10-CM | POA: Diagnosis not present

## 2022-02-18 DIAGNOSIS — R5383 Other fatigue: Secondary | ICD-10-CM | POA: Diagnosis not present

## 2022-02-18 DIAGNOSIS — Z79899 Other long term (current) drug therapy: Secondary | ICD-10-CM | POA: Diagnosis not present

## 2022-03-22 DIAGNOSIS — N1831 Chronic kidney disease, stage 3a: Secondary | ICD-10-CM | POA: Diagnosis not present

## 2022-03-22 DIAGNOSIS — Z299 Encounter for prophylactic measures, unspecified: Secondary | ICD-10-CM | POA: Diagnosis not present

## 2022-03-22 DIAGNOSIS — R110 Nausea: Secondary | ICD-10-CM | POA: Diagnosis not present

## 2022-03-22 DIAGNOSIS — I10 Essential (primary) hypertension: Secondary | ICD-10-CM | POA: Diagnosis not present

## 2022-03-22 DIAGNOSIS — I429 Cardiomyopathy, unspecified: Secondary | ICD-10-CM | POA: Diagnosis not present

## 2022-04-14 DIAGNOSIS — H04123 Dry eye syndrome of bilateral lacrimal glands: Secondary | ICD-10-CM | POA: Diagnosis not present

## 2022-04-14 DIAGNOSIS — H18453 Nodular corneal degeneration, bilateral: Secondary | ICD-10-CM | POA: Diagnosis not present

## 2022-04-14 DIAGNOSIS — H353132 Nonexudative age-related macular degeneration, bilateral, intermediate dry stage: Secondary | ICD-10-CM | POA: Diagnosis not present

## 2022-06-16 DIAGNOSIS — R059 Cough, unspecified: Secondary | ICD-10-CM | POA: Diagnosis not present

## 2022-06-16 DIAGNOSIS — R1110 Vomiting, unspecified: Secondary | ICD-10-CM | POA: Diagnosis not present

## 2022-06-16 DIAGNOSIS — Z5321 Procedure and treatment not carried out due to patient leaving prior to being seen by health care provider: Secondary | ICD-10-CM | POA: Diagnosis not present

## 2022-06-16 DIAGNOSIS — R0989 Other specified symptoms and signs involving the circulatory and respiratory systems: Secondary | ICD-10-CM | POA: Diagnosis not present

## 2022-06-16 DIAGNOSIS — Z20822 Contact with and (suspected) exposure to covid-19: Secondary | ICD-10-CM | POA: Diagnosis not present

## 2022-06-16 DIAGNOSIS — J029 Acute pharyngitis, unspecified: Secondary | ICD-10-CM | POA: Diagnosis not present

## 2022-06-17 DIAGNOSIS — K2900 Acute gastritis without bleeding: Secondary | ICD-10-CM | POA: Diagnosis not present

## 2022-06-17 DIAGNOSIS — Z299 Encounter for prophylactic measures, unspecified: Secondary | ICD-10-CM | POA: Diagnosis not present

## 2022-06-17 DIAGNOSIS — I10 Essential (primary) hypertension: Secondary | ICD-10-CM | POA: Diagnosis not present

## 2022-06-17 DIAGNOSIS — R112 Nausea with vomiting, unspecified: Secondary | ICD-10-CM | POA: Diagnosis not present

## 2022-09-28 DIAGNOSIS — R350 Frequency of micturition: Secondary | ICD-10-CM | POA: Diagnosis not present

## 2022-09-28 DIAGNOSIS — Z0000 Encounter for general adult medical examination without abnormal findings: Secondary | ICD-10-CM | POA: Diagnosis not present

## 2022-09-28 DIAGNOSIS — I10 Essential (primary) hypertension: Secondary | ICD-10-CM | POA: Diagnosis not present

## 2022-09-28 DIAGNOSIS — Z7189 Other specified counseling: Secondary | ICD-10-CM | POA: Diagnosis not present

## 2022-09-28 DIAGNOSIS — Z299 Encounter for prophylactic measures, unspecified: Secondary | ICD-10-CM | POA: Diagnosis not present

## 2022-09-28 DIAGNOSIS — I739 Peripheral vascular disease, unspecified: Secondary | ICD-10-CM | POA: Diagnosis not present

## 2022-09-28 DIAGNOSIS — I429 Cardiomyopathy, unspecified: Secondary | ICD-10-CM | POA: Diagnosis not present

## 2022-10-13 DIAGNOSIS — H04123 Dry eye syndrome of bilateral lacrimal glands: Secondary | ICD-10-CM | POA: Diagnosis not present

## 2022-10-13 DIAGNOSIS — H18453 Nodular corneal degeneration, bilateral: Secondary | ICD-10-CM | POA: Diagnosis not present

## 2022-10-13 DIAGNOSIS — H353132 Nonexudative age-related macular degeneration, bilateral, intermediate dry stage: Secondary | ICD-10-CM | POA: Diagnosis not present

## 2022-10-23 IMAGING — CT CT ABD-PELV W/ CM
2 of 5 series · 16 of 46 positions shown, 18 images · IV contrast (Omnipaque or Isovue)
Comparison: 12/01/2015

CLINICAL DATA: Nausea and vomiting.  Chest pain and weakness.

EXAM:
CT ABDOMEN AND PELVIS WITH CONTRAST
TECHNIQUE: Multidetector CT imaging of the abdomen and pelvis was performed
using the standard protocol following bolus administration of
intravenous contrast.

[Series 2: axial st · axial · 0.89mm/px · z∈[+944,+1354]mm · 13 of 94 slices shown, 15 images]
[im 6/94  soft-tissue]
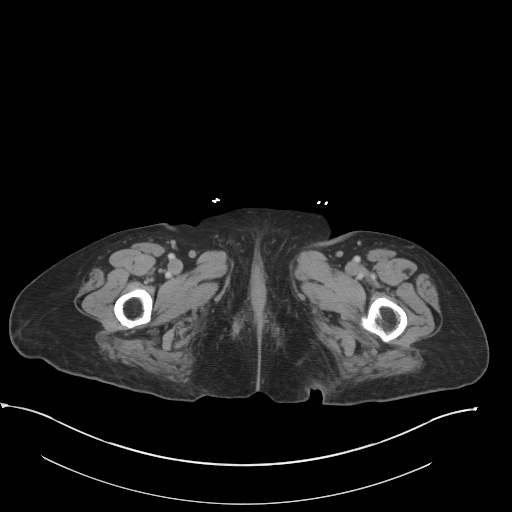
[im 6/94  bone]
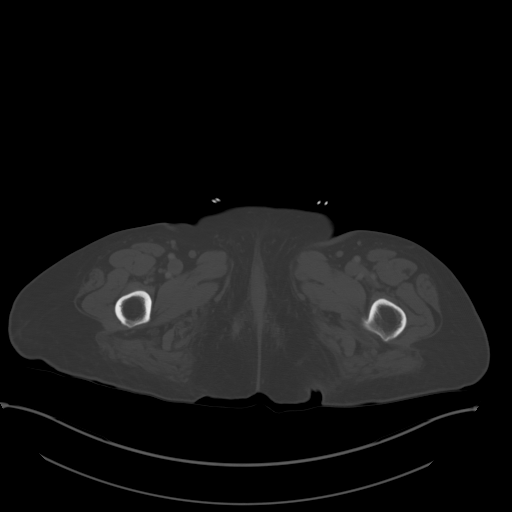
[im 11/94  soft-tissue]
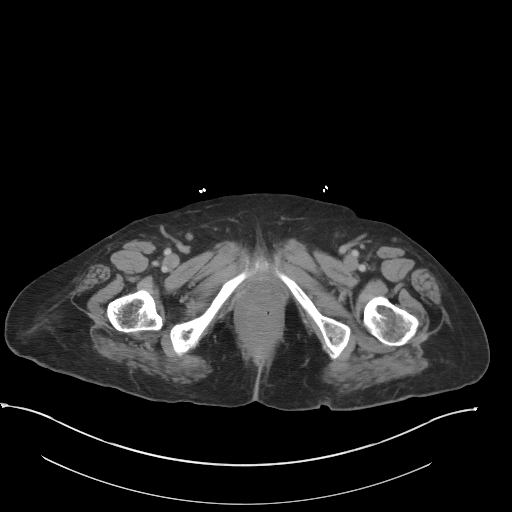
[im 21/94  soft-tissue]
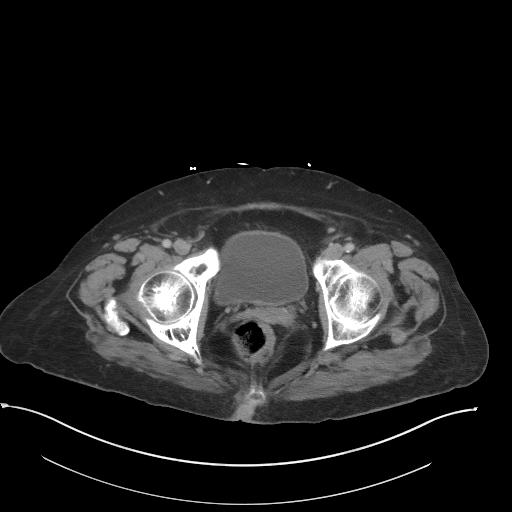
[im 26/94  soft-tissue]
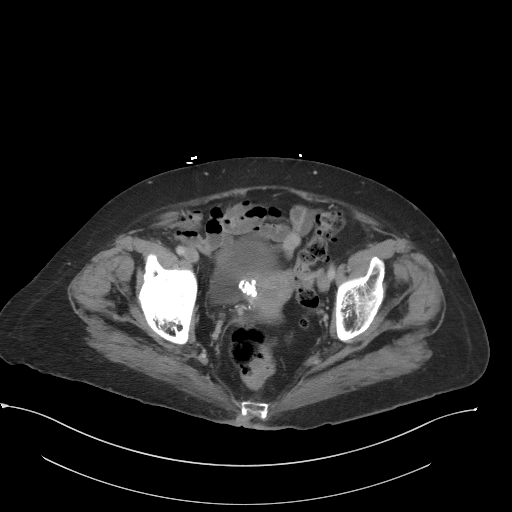
[im 32/94  soft-tissue]
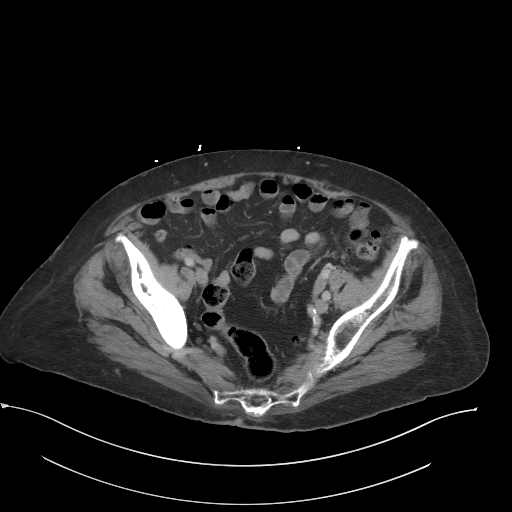
[im 42/94  soft-tissue]
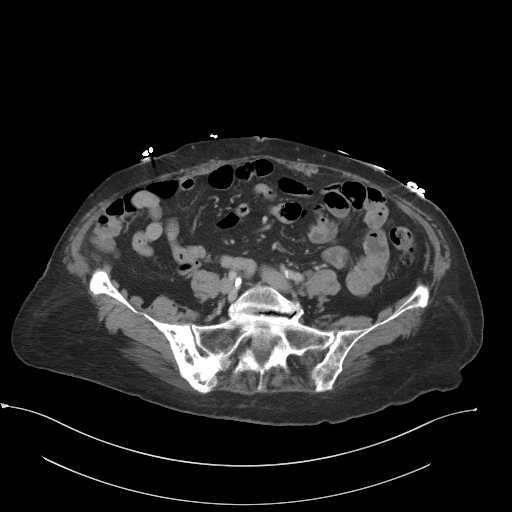
[im 47/94  soft-tissue]
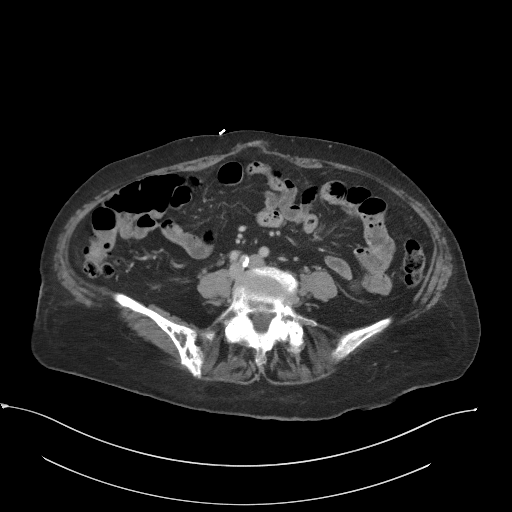
[im 52/94  soft-tissue]
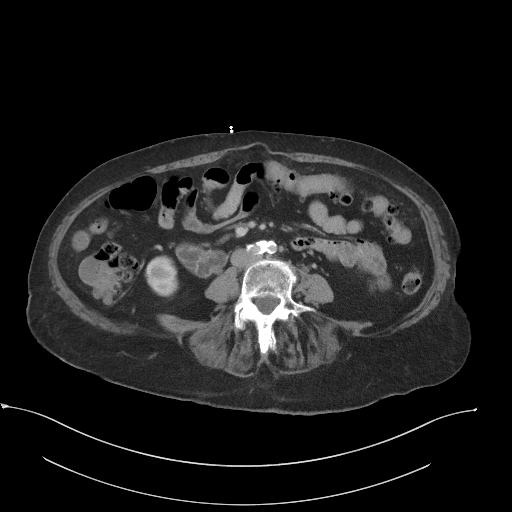
[im 63/94  soft-tissue]
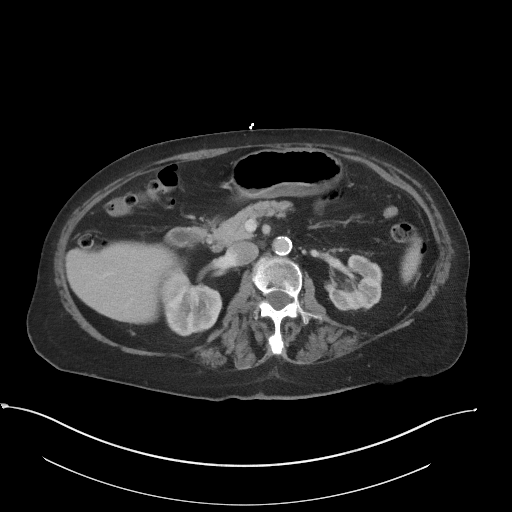
[im 63/94  bone]
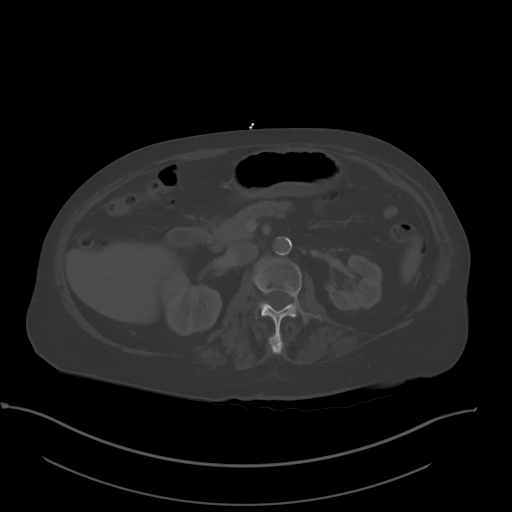
[im 68/94  soft-tissue]
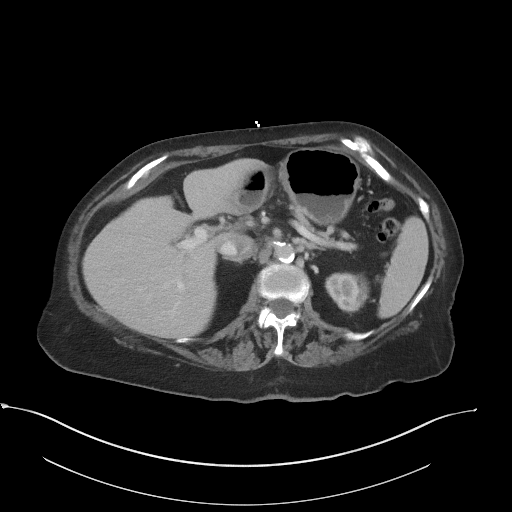
[im 73/94  soft-tissue]
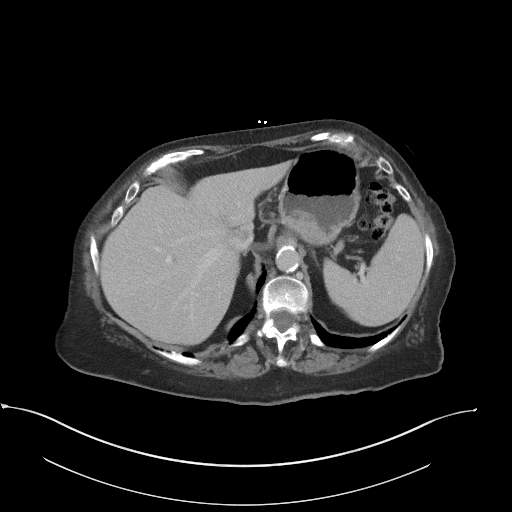
[im 83/94  soft-tissue]
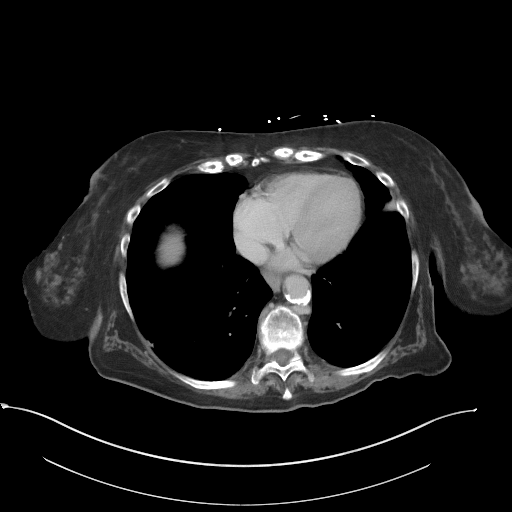
[im 88/94  soft-tissue]
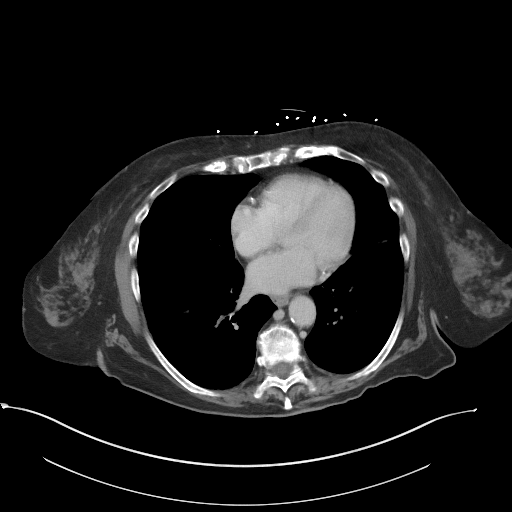

[Series 6: coronal st · coronal · 0.81mm/px · 3 of 117 slices shown]
[im 39/117  soft-tissue]
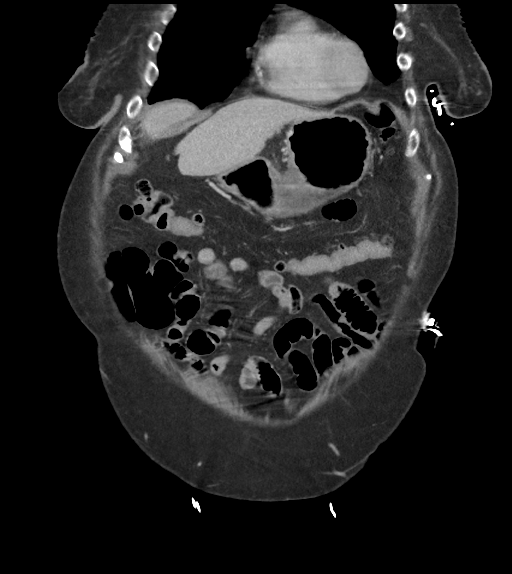
[im 52/117  soft-tissue]
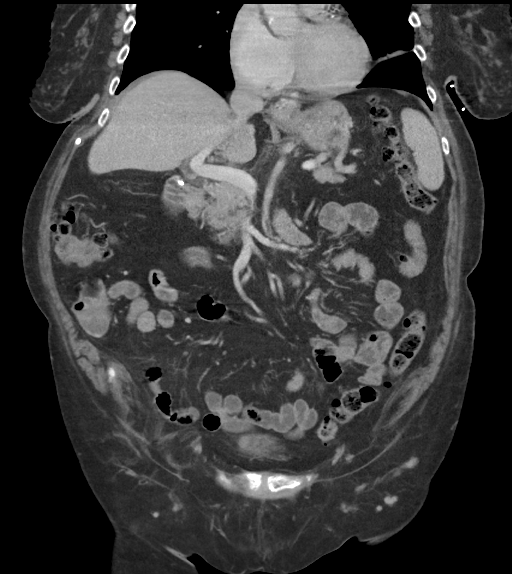
[im 65/117  soft-tissue]
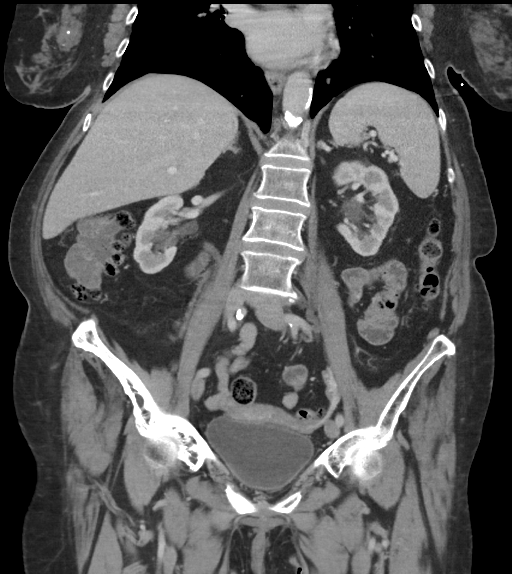

[16 of 46 positions shown; findings below may reference images not displayed]

RADIATION DOSE REDUCTION: This exam was performed according to the
departmental dose-optimization program which includes automated
exposure control, adjustment of the mA and/or kV according to
patient size and/or use of iterative reconstruction technique.

CONTRAST:  100mL OMNIPAQUE IOHEXOL 300 MG/ML  SOLN
FINDINGS: Lower chest: Unremarkable.

Hepatobiliary: No suspicious focal abnormality within the liver
parenchyma. Gallbladder surgically absent. No intrahepatic or
extrahepatic biliary dilation.

Pancreas: No focal mass lesion. No dilatation of the main duct. No
intraparenchymal cyst. No peripancreatic edema.

Spleen: No splenomegaly. No focal mass lesion.

Adrenals/Urinary Tract: No adrenal nodule or mass. Cortical scarring
noted both kidneys without suspicious enhancing renal mass lesion.
No evidence for hydroureter. The urinary bladder appears normal for
the degree of distention.

Stomach/Bowel: Small hiatal hernia. Mild wall thickening noted in
the mid stomach (image 32/2) a gastritis would be a consideration.
Duodenum is normally positioned as is the ligament of Treitz. No
small bowel wall thickening. No small bowel dilatation. The terminal
ileum is normal. The appendix is not well visualized, but there is
no edema or inflammation in the region of the cecum. Diverticuli are
seen scattered along the entire length of the colon without CT
findings of diverticulitis.

Vascular/Lymphatic: There is moderate atherosclerotic calcification
of the abdominal aorta without aneurysm. There is no gastrohepatic
or hepatoduodenal ligament lymphadenopathy. No retroperitoneal or
mesenteric lymphadenopathy. No pelvic sidewall lymphadenopathy.

Reproductive: Calcified fibroid noted in the uterus. There is no
adnexal mass.

Other: No intraperitoneal free fluid.

Musculoskeletal: No worrisome lytic or sclerotic osseous
abnormality. Bony changes in the right pelvis are compatible with
Paget's disease, stable.
IMPRESSION: 1. Mild wall thickening in the mid stomach. Gastritis would be a
consideration.
2. Small hiatal hernia.
3. Diffuse colonic diverticulosis without diverticulitis.
4. Aortic Atherosclerosis ([id number]).

## 2022-10-23 IMAGING — DX DG CHEST 1V PORT
1 series · 1 of 1 positions shown · non-contrast
Comparison: 03/16/2020

CLINICAL DATA: Mid chest pain, weakness

EXAM:
PORTABLE CHEST 1 VIEW

[chest ap]
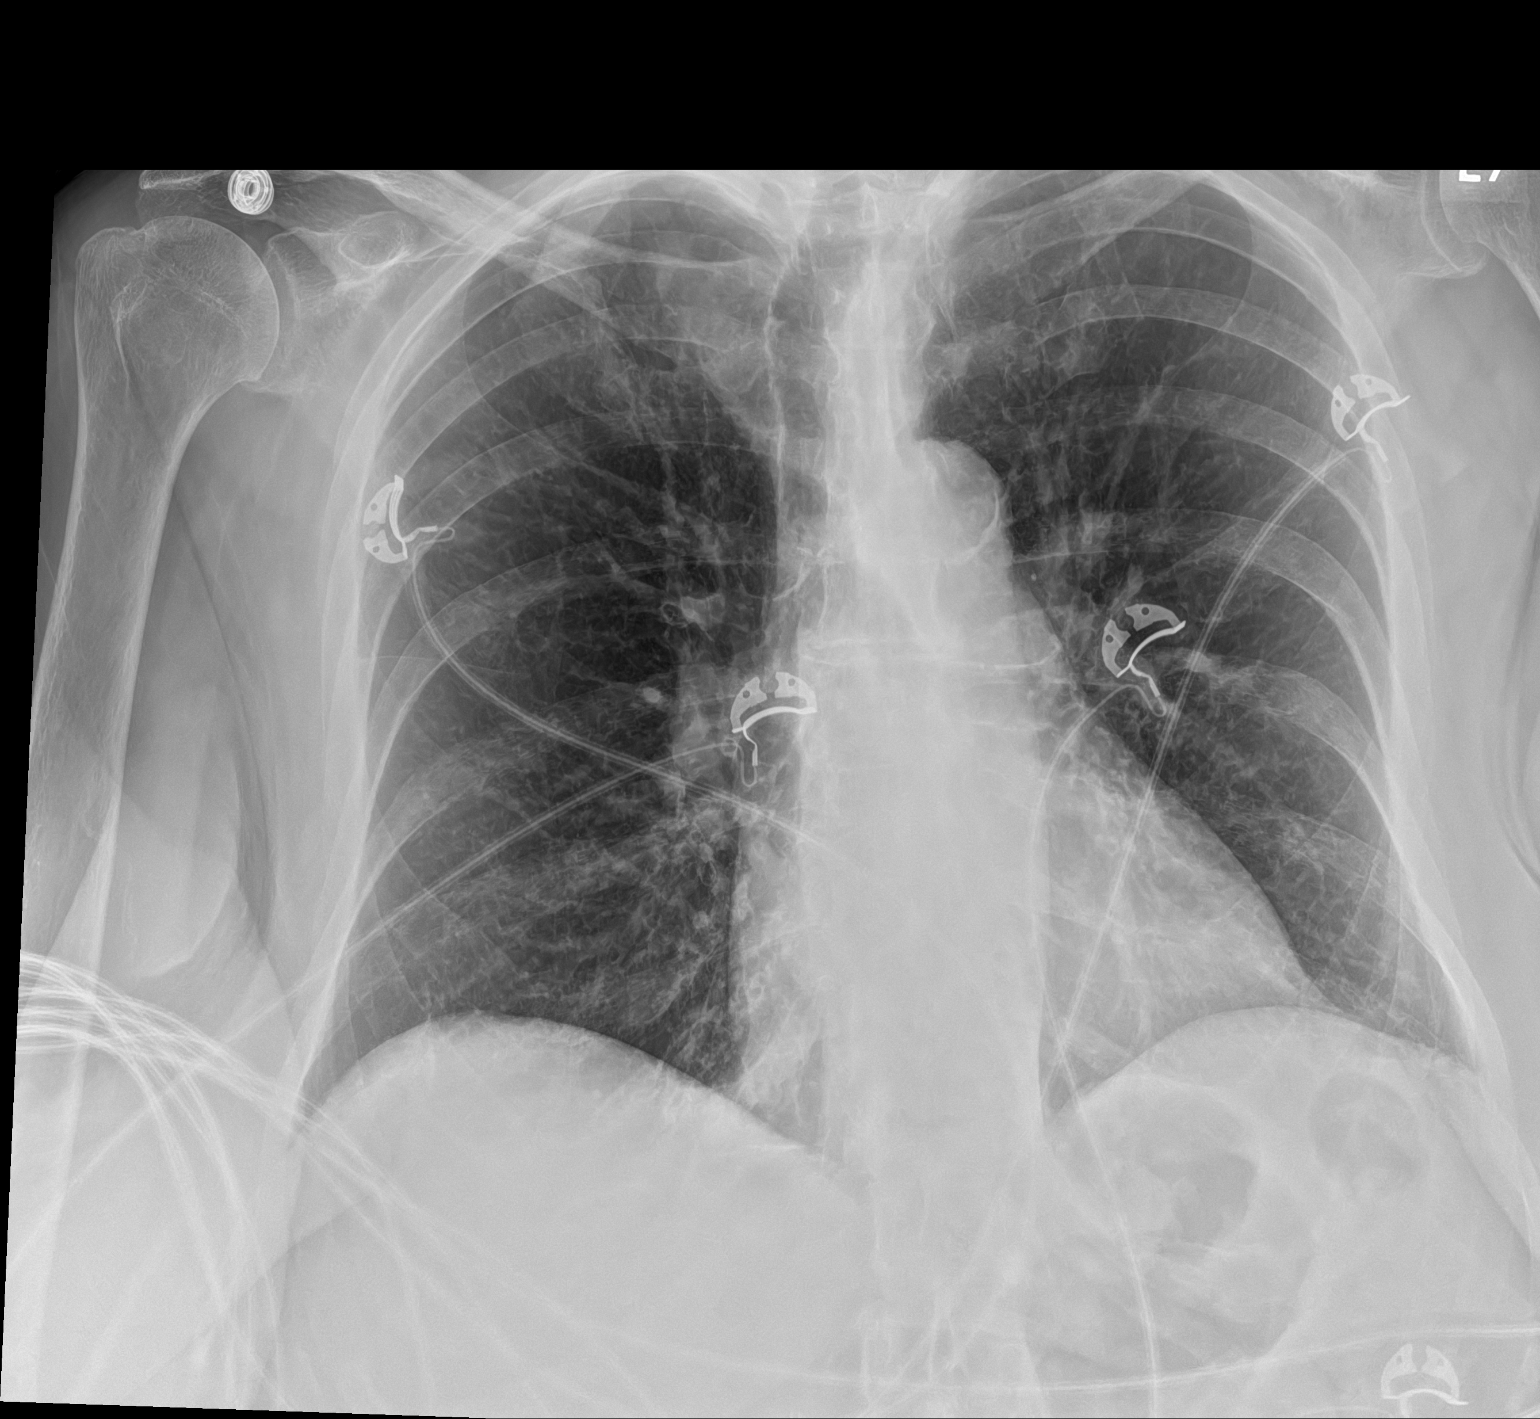

[1 of 1 positions shown; findings below may reference images not displayed]

FINDINGS: Single frontal view of the chest demonstrates an unremarkable
cardiac silhouette. No airspace disease, effusion, or pneumothorax.
No acute bony abnormalities.
IMPRESSION: 1. No acute intrathoracic process.

## 2022-12-27 DIAGNOSIS — Z299 Encounter for prophylactic measures, unspecified: Secondary | ICD-10-CM | POA: Diagnosis not present

## 2022-12-27 DIAGNOSIS — R42 Dizziness and giddiness: Secondary | ICD-10-CM | POA: Diagnosis not present

## 2022-12-27 DIAGNOSIS — R5383 Other fatigue: Secondary | ICD-10-CM | POA: Diagnosis not present

## 2022-12-27 DIAGNOSIS — K5900 Constipation, unspecified: Secondary | ICD-10-CM | POA: Diagnosis not present

## 2022-12-27 DIAGNOSIS — I10 Essential (primary) hypertension: Secondary | ICD-10-CM | POA: Diagnosis not present

## 2023-03-01 DIAGNOSIS — Z299 Encounter for prophylactic measures, unspecified: Secondary | ICD-10-CM | POA: Diagnosis not present

## 2023-03-01 DIAGNOSIS — I10 Essential (primary) hypertension: Secondary | ICD-10-CM | POA: Diagnosis not present

## 2023-03-01 DIAGNOSIS — Z79899 Other long term (current) drug therapy: Secondary | ICD-10-CM | POA: Diagnosis not present

## 2023-03-01 DIAGNOSIS — R5383 Other fatigue: Secondary | ICD-10-CM | POA: Diagnosis not present

## 2023-03-01 DIAGNOSIS — I700 Atherosclerosis of aorta: Secondary | ICD-10-CM | POA: Diagnosis not present

## 2023-03-01 DIAGNOSIS — Z0000 Encounter for general adult medical examination without abnormal findings: Secondary | ICD-10-CM | POA: Diagnosis not present

## 2023-03-01 DIAGNOSIS — K219 Gastro-esophageal reflux disease without esophagitis: Secondary | ICD-10-CM | POA: Diagnosis not present

## 2023-03-01 DIAGNOSIS — E7800 Pure hypercholesterolemia, unspecified: Secondary | ICD-10-CM | POA: Diagnosis not present

## 2023-03-09 DIAGNOSIS — Z299 Encounter for prophylactic measures, unspecified: Secondary | ICD-10-CM | POA: Diagnosis not present

## 2023-03-09 DIAGNOSIS — I10 Essential (primary) hypertension: Secondary | ICD-10-CM | POA: Diagnosis not present

## 2023-03-09 DIAGNOSIS — M546 Pain in thoracic spine: Secondary | ICD-10-CM | POA: Diagnosis not present

## 2023-04-02 DIAGNOSIS — W57XXXA Bitten or stung by nonvenomous insect and other nonvenomous arthropods, initial encounter: Secondary | ICD-10-CM | POA: Diagnosis not present

## 2023-04-02 DIAGNOSIS — Z6824 Body mass index (BMI) 24.0-24.9, adult: Secondary | ICD-10-CM | POA: Diagnosis not present

## 2023-04-02 DIAGNOSIS — L500 Allergic urticaria: Secondary | ICD-10-CM | POA: Diagnosis not present

## 2023-04-02 DIAGNOSIS — R030 Elevated blood-pressure reading, without diagnosis of hypertension: Secondary | ICD-10-CM | POA: Diagnosis not present

## 2023-04-04 DIAGNOSIS — Z23 Encounter for immunization: Secondary | ICD-10-CM | POA: Diagnosis not present

## 2023-04-05 DIAGNOSIS — R030 Elevated blood-pressure reading, without diagnosis of hypertension: Secondary | ICD-10-CM | POA: Diagnosis not present

## 2023-04-05 DIAGNOSIS — L209 Atopic dermatitis, unspecified: Secondary | ICD-10-CM | POA: Diagnosis not present

## 2023-05-24 DIAGNOSIS — K219 Gastro-esophageal reflux disease without esophagitis: Secondary | ICD-10-CM | POA: Diagnosis not present

## 2023-05-24 DIAGNOSIS — Z299 Encounter for prophylactic measures, unspecified: Secondary | ICD-10-CM | POA: Diagnosis not present

## 2023-05-24 DIAGNOSIS — I10 Essential (primary) hypertension: Secondary | ICD-10-CM | POA: Diagnosis not present

## 2023-05-30 ENCOUNTER — Encounter (INDEPENDENT_AMBULATORY_CARE_PROVIDER_SITE_OTHER): Payer: Self-pay | Admitting: *Deleted

## 2023-06-15 ENCOUNTER — Ambulatory Visit (INDEPENDENT_AMBULATORY_CARE_PROVIDER_SITE_OTHER): Payer: Medicare Other | Admitting: Gastroenterology

## 2023-06-15 ENCOUNTER — Encounter (INDEPENDENT_AMBULATORY_CARE_PROVIDER_SITE_OTHER): Payer: Self-pay | Admitting: Gastroenterology

## 2023-06-15 ENCOUNTER — Telehealth (INDEPENDENT_AMBULATORY_CARE_PROVIDER_SITE_OTHER): Payer: Self-pay | Admitting: Gastroenterology

## 2023-06-15 VITALS — BP 145/81 | HR 84 | Temp 98.7°F | Ht 65.0 in | Wt 156.0 lb

## 2023-06-15 DIAGNOSIS — K5904 Chronic idiopathic constipation: Secondary | ICD-10-CM

## 2023-06-15 DIAGNOSIS — R748 Abnormal levels of other serum enzymes: Secondary | ICD-10-CM | POA: Insufficient documentation

## 2023-06-15 DIAGNOSIS — Q438 Other specified congenital malformations of intestine: Secondary | ICD-10-CM

## 2023-06-15 DIAGNOSIS — K5900 Constipation, unspecified: Secondary | ICD-10-CM | POA: Diagnosis not present

## 2023-06-15 DIAGNOSIS — R109 Unspecified abdominal pain: Secondary | ICD-10-CM

## 2023-06-15 DIAGNOSIS — K219 Gastro-esophageal reflux disease without esophagitis: Secondary | ICD-10-CM | POA: Insufficient documentation

## 2023-06-15 DIAGNOSIS — R1112 Projectile vomiting: Secondary | ICD-10-CM | POA: Insufficient documentation

## 2023-06-15 DIAGNOSIS — R112 Nausea with vomiting, unspecified: Secondary | ICD-10-CM | POA: Diagnosis not present

## 2023-06-15 MED ORDER — PSYLLIUM 58.6 % PO PACK: 1.0000 | PACK | Freq: Two times a day (BID) | ORAL | 2 refills | Status: AC

## 2023-06-15 MED ORDER — POLYETHYLENE GLYCOL 3350 17 G PO PACK: 17.0000 g | PACK | Freq: Two times a day (BID) | ORAL | 0 refills | Status: AC

## 2023-06-15 MED ORDER — ONDANSETRON 8 MG PO TBDP: 8.0000 mg | ORAL_TABLET | Freq: Three times a day (TID) | ORAL | 0 refills | Status: DC | PRN

## 2023-06-15 MED ORDER — OMEPRAZOLE 40 MG PO CPDR: 40.0000 mg | DELAYED_RELEASE_CAPSULE | Freq: Every day | ORAL | 3 refills | Status: DC

## 2023-06-15 NOTE — Progress Notes (Signed)
Vista Lawman , M.D. Gastroenterology & Hepatology [facility] [address] [place], [place] [address] Primary Care Physician: Ignatius Specking, MD [address] [place] [address]  Chief Complaint:  multiple complaints: persistent nausea/vomiting , constipation , abdominal discomfort , weakness , "abdominal knot "   History of Present Illness:  Melissa SCHOLLE is a 89 y.o. female with HTN , GERD , CAD who presents for evaluation of multiple complaints: persistent nausea/vomiting , constipation , abdominal discomfort , weakness , "abdominal knot "   Patient is seen in the GI clinic with husband in the room.  Patient is seen in the clinic appears to be somewhat distressed feeling nauseous and burping.  Patient has several complaints today and appears to have complex history.  Reports that she feels nauseous all the time this is accompanied with vomiting 2-3 times daily.  Patient reports her symptoms has been going on for 70 years any relief.  Nausea and vomiting is worsened postprandially.  Patient denies dysphagia which she previously had many years ago and denies any heartburn at this time as well.  Patient reports having a bowel movement every 1 to 2 weeks which is hard stools alternating with liquid stools.  Patient reports feeling a knot in her abdomen which hurts just sometimes.  No aggravating relieving factors  Last EGD:2013  Stricture at GE junction. Was dilated with a balloon to 19 mm. Moderate size sliding hiatal hernia. Few antral telangiectasia along with changes of nonerosive gastritis.  Last Colonoscopy:2017-incomplete colonoscopy   - The procedure was aborted due to the difficulty of the procedure. - Redundant colon. - Diverticulosis in the sigmoid colon, in the descending colon, at the splenic flexure and in the transverse colon. - One 7 mm polyp in the sigmoid colon, removed with a hot snare. Resected and retrieved. - Anal  papilla( e) were hypertrophied.  UJW:[id number] for CRC and paternal uncle who was in his 26s or 57s at the time of diagnosis  Past Medical History: Past Medical History:  Diagnosis Date   Arthritis    Asthma    GERD (gastroesophageal reflux disease)    Hypertension    Myocardial infarction (HCC) 03/2013   Respiratory failure (HCC)    August 2016, requiring intubation   Ventral hernia     Past Surgical History: Past Surgical History:  Procedure Laterality Date   APPENDECTOMY     CATARACT EXTRACTION W/PHACO  11/09/2011   Procedure: CATARACT EXTRACTION PHACO AND INTRAOCULAR LENS PLACEMENT (IOC);  Surgeon: Loraine Leriche T. Nile Riggs, MD;  Location: [facility];  Service: Ophthalmology;  Laterality: Right;  CDE 10.01   CHOLECYSTECTOMY     COLONOSCOPY N/A 10/30/2015   Procedure: COLONOSCOPY;  Surgeon: Malissa Hippo, MD;  Location: [facility];  Service: Endoscopy;  Laterality: N/A;  2:20 - moved to 4/20 @ 1:25   ESOPHAGOGASTRODUODENOSCOPY (EGD) WITH ESOPHAGEAL DILATION  05/11/2012   Procedure: ESOPHAGOGASTRODUODENOSCOPY (EGD) WITH ESOPHAGEAL DILATION;  Surgeon: Malissa Hippo, MD;  Location: [facility];  Service: Endoscopy;  Laterality: N/A;   EYE SURGERY     left KPE   INSERTION OF MESH  07/17/2012   Procedure: INSERTION OF MESH;  Surgeon: Atilano Ina, MD,FACS;  Location: [facility] OR;  Service: General;  Laterality: N/A;   LEFT HEART CATHETERIZATION WITH CORONARY ANGIOGRAM N/A 03/19/2013   Procedure: LEFT HEART CATHETERIZATION WITH CORONARY ANGIOGRAM;  Surgeon: Robynn Pane, MD;  Location: [facility] CATH [facility];  Service: Cardiovascular;  Laterality: N/A;  TOE SURGERY     right foot   TONSILLECTOMY     TUBAL LIGATION     VENTRAL HERNIA REPAIR  07/17/2012   Procedure: LAPAROSCOPIC VENTRAL HERNIA;  Surgeon: Atilano Ina, MD,FACS;  Location: [facility] OR;  Service: General;  Laterality: N/A;    Family History: Family History  Problem Relation Age of Onset   Heart disease Mother    Stroke Mother    Stroke  Father    Heart disease Father    Hypertension Sister    Cancer Brother        liver   Breast cancer Other    Ovarian cancer Other    Colon cancer Other    Malignant hyperthermia Neg Hx    Pseudochol deficiency Neg Hx    Hypotension Neg Hx    Anesthesia problems Neg Hx     Social History: Social History   Tobacco Use  Smoking Status Never  Smokeless Tobacco Never   Social History   Substance and Sexual Activity  Alcohol Use No   Social History   Substance and Sexual Activity  Drug Use No    Allergies: Allergies  Allergen Reactions   Neosporin [Neomycin-Bacitracin Zn-Polymyx] Other (See Comments)    blisters   Penicillins Swelling and Rash    Has patient had a PCN reaction causing immediate rash, facial/tongue/throat swelling, SOB or lightheadedness with hypotension:unknown Has patient had a PCN reaction causing severe rash involving mucus membranes or skin necrosis: unknown Has patient had a PCN reaction that required hospitalization: unknown Has patient had a PCN reaction occurring within the last 10 years: unknown If all of the above answers are "NO", then may proceed with Cephalosporin use.      Medications: Current Outpatient Medications  Medication Sig Dispense Refill   nitroGLYCERIN (NITROSTAT) 0.4 MG SL tablet Place 1 tablet (0.4 mg total) under the tongue every 5 (five) minutes x 3 doses as needed for chest pain. 25 tablet 12   ondansetron (ZOFRAN-ODT) 8 MG disintegrating tablet Take 1 tablet (8 mg total) by mouth every 8 (eight) hours as needed for nausea or vomiting. 10 tablet 0   pantoprazole (PROTONIX) 40 MG tablet Take 1 tablet (40 mg total) by mouth daily. 30 tablet 0   amLODipine (NORVASC) 5 MG tablet Take by mouth. (Patient not taking: Reported on 06/15/2023)     aspirin 81 MG chewable tablet Chew 1 tablet (81 mg total) by mouth daily. (Patient not taking: Reported on 06/15/2023) 30 tablet 3   atorvastatin (LIPITOR) 10 MG tablet Take 5 mg by mouth  daily. (Patient not taking: Reported on 06/15/2023)  2   Cholecalciferol (VITAMIN D3 GUMMIES ADULT PO) Take by mouth. (Patient not taking: Reported on 06/15/2023)     clopidogrel (PLAVIX) 75 MG tablet Take 75 mg by mouth daily. (Patient not taking: Reported on 06/15/2023)     cyanocobalamin 100 MCG tablet Take 100 mcg by mouth daily. (Patient not taking: Reported on 06/15/2023)     lisinopril (ZESTRIL) 5 MG tablet Take 1 tablet (5 mg total) by mouth daily. (Patient not taking: Reported on 08/26/2021) 20 tablet 0   OVER THE COUNTER MEDICATION nuropathy (Patient not taking: Reported on 06/15/2023)     vitamin C (ASCORBIC ACID) 500 MG tablet Take 1,500 mg by mouth daily. (Patient not taking: Reported on 06/15/2023)     No current facility-administered medications for this visit.    Review of Systems: GENERAL: negative for malaise, night sweats HEENT: No changes in hearing or  vision, no nose bleeds or other nasal problems. NECK: Negative for lumps, goiter, pain and significant neck swelling RESPIRATORY: Negative for cough, wheezing CARDIOVASCULAR: Negative for chest pain, leg swelling, palpitations, orthopnea GI: SEE HPI MUSCULOSKELETAL: Negative for joint pain or swelling, back pain, and muscle pain. SKIN: Negative for lesions, rash HEMATOLOGY Negative for prolonged bleeding, bruising easily, and swollen nodes. ENDOCRINE: Negative for cold or heat intolerance, polyuria, polydipsia and goiter. NEURO: negative for tremor, gait imbalance, syncope and seizures. The remainder of the review of systems is noncontributory.   Physical Exam: BP (!) 145/81 (BP Location: Right Arm, Patient Position: Sitting, Cuff Size: Normal)   Pulse 84   Temp 98.7 F (37.1 C) (Oral)   Ht 5\' 5"  (1.651 m)   Wt 156 lb (70.8 kg)   BMI 25.96 kg/m  GENERAL: The patient is AO x3, in no acute distress. HEENT: Head is normocephalic and atraumatic. EOMI are intact. Mouth is well hydrated and without lesions. NECK: Supple. No  masses LUNGS: Clear to auscultation. No presence of rhonchi/wheezing/rales. Adequate chest expansion HEART: RRR, normal s1 and s2. ABDOMEN: Soft, nontender, no guarding, no peritoneal signs, and nondistended. BS +. No masses.  Imaging/Labs: as above     Latest Ref Rng & Units 08/26/2021    2:07 PM 12/28/2019   12:49 PM 10/20/2017    2:16 PM  CBC  WBC 4.0 - 10.5 K/uL 5.6  5.3  6.5   Hemoglobin 12.0 - 15.0 g/dL 82.9  56.2  13.0   Hematocrit 36.0 - 46.0 % 43.7  41.7  37.4   Platelets 150 - 400 K/uL 283  276  266    No results found for: "IRON", "TIBC", "FERRITIN"  I personally reviewed and interpreted the available labs, imaging and endoscopic files.   IMPRESSION: 1. Mild wall thickening in the mid stomach. Gastritis would be a consideration. 2. Small hiatal hernia. 3. Diffuse colonic diverticulosis without diverticulitis. 4. Aortic Atherosclerosis (ICD10-I70.0).  2017 virtual colonoscopy   IMPRESSION: Suspected 7 mm colonic polyp on a fold in the rectum, 8 mm from the anal verge. Otherwise, no significant colonic polypoid lesion, mass, or stricture.   Extensive colonic diverticulosis, without evidence of diverticulitis.   Punctate nonobstructing right lower pole renal calculus. No ureteral or bladder calculi. No hydronephrosis. Otherwise unremarkable unenhanced CT abdomen/pelvis.  Blood work from 02/2023 hemoglobin 12.6 platelet 265 WBC 4.8 creatinine 1.15 alk phos 131 previously 173 normal AST ALT T. bili 0.5 TSH 2.5  Impression and Plan:  Melissa Ross is a 89 y.o. female with HTN , GERD , CAD who presents for evaluation of multiple complaints: persistent nausea/vomiting , constipation , abdominal discomfort , weakness , "abdominal knot "  #Multiple Gi complaints : nausea and vomiting   Patient has several complaints.  Mostly it is persistent nausea and vomiting without any relieving factors.  Vomiting is worsened after food intake.  Denies any dysphagia or  heartburn  Last upper endoscopy 2013 where patient presented with episodes of self resolving dysphagia  This could be uncontrolled GERD, gastroparesis esophageal dysmotility, achalasia and malignancy needs to be ruled out  Last CT scan from 08/2021 without any overt malignancy.   Discussed risk-benefit indication and limitation of upper endoscopy.  Patient at this time is frightened to undergo anesthesia and procedure.  I discussed with patient without upper endoscopy would not be able to rule out lesion such as malignancy  Will proceed with upper GI series with esophagogram to evaluate for any structural lesion  Given worsening postprandial nausea and vomiting will obtain gastric emptying study to assess for gastroparesis  Will obtain complete abdominal ultrasound  If above workup is an unrevealing we will rediscuss upper endoscopy  PPI daily 30 minutes before breakfast and Zofran as needed for symptomatic relief   #Elevated ALP  alk phos 131 previously 173 normal AST ALT T. bili 0.5 .  Will obtain abdominal ultrasound to evaluate for any cholelithiasis or choledocholithiasis  # Incomplete colonoscopy   There appears patient never had a complete colonoscopy as last colonoscopy was in 2017 by Dr. Karilyn Cota which was not completed due to difficult tortuous colon.  Given advanced age after discussing risk-benefit limitation of colonoscopy patient have opted out to proceed with any colonoscopy at this time  #Constipation   From history it appears patient has severe constipation as she reports having 1 bowel movement every 1 to 2 days which may also be contributing to nausea and vomiting  Ensure adequate fluid intake: Aim for 8 glasses of water daily. Follow a high fiber diet: Include foods such as dates, prunes, pears, and kiwi. Take Miralax twice a day for the first week, then reduce to once daily thereafter. Use Metamucil twice a day.  RTC 1 months after above workup  All  questions were answered.      Vista Lawman, MD Gastroenterology and Hepatology [facility]   This chart has been completed using [facility] Dictation software, and while attempts have been made to ensure accuracy , certain words and phrases may not be transcribed as intended

## 2023-06-15 NOTE — Telephone Encounter (Signed)
No PA needed for GES. [place] and DG UGI W DOUBLE CM scheduled for 06/22/23 beginning at 9:30am. NPO after midnight. Arrive 15 minutes early. Attempted to scheduled GES but no one available at [facility]. Will call them in the morning to get pt scheduled.

## 2023-06-15 NOTE — Patient Instructions (Signed)
It was very nice to meet you today, as dicussed with will plan for the following :  1) Upper GI series with esophagram  2) Abdominal ultrasound  3) Gastric emptying study   4)Ensure adequate fluid intake: Aim for 8 glasses of water daily. Follow a high fiber diet: Include foods such as dates, prunes, pears, and kiwi. Take Miralax twice a day for the first week, then reduce to once daily thereafter. Use Metamucil twice a day.   5) Omeprazole and Zofran

## 2023-06-16 NOTE — Telephone Encounter (Signed)
GES scheduled for 06/23/23 at 8:00am. NPO after midnight. No stomach meds. Will be at [facility] for up to 4 hous. Pt contacted and verbalized understanding.

## 2023-06-22 ENCOUNTER — Ambulatory Visit (HOSPITAL_COMMUNITY)
Admission: RE | Admit: 2023-06-22 | Discharge: 2023-06-22 | Disposition: A | Discharge status: Home / Self Care | Payer: Medicare Other | Source: Ambulatory Visit | Attending: Gastroenterology | Admitting: Gastroenterology

## 2023-06-22 DIAGNOSIS — R1310 Dysphagia, unspecified: Secondary | ICD-10-CM | POA: Diagnosis not present

## 2023-06-22 DIAGNOSIS — R1112 Projectile vomiting: Secondary | ICD-10-CM

## 2023-06-22 DIAGNOSIS — K449 Diaphragmatic hernia without obstruction or gangrene: Secondary | ICD-10-CM | POA: Diagnosis not present

## 2023-06-22 DIAGNOSIS — R932 Abnormal findings on diagnostic imaging of liver and biliary tract: Secondary | ICD-10-CM | POA: Diagnosis not present

## 2023-06-22 DIAGNOSIS — K224 Dyskinesia of esophagus: Secondary | ICD-10-CM | POA: Diagnosis not present

## 2023-06-22 DIAGNOSIS — R748 Abnormal levels of other serum enzymes: Secondary | ICD-10-CM | POA: Insufficient documentation

## 2023-06-22 DIAGNOSIS — R7989 Other specified abnormal findings of blood chemistry: Secondary | ICD-10-CM | POA: Diagnosis not present

## 2023-06-22 DIAGNOSIS — R112 Nausea with vomiting, unspecified: Secondary | ICD-10-CM | POA: Diagnosis not present

## 2023-06-23 ENCOUNTER — Encounter (HOSPITAL_COMMUNITY)
Admission: RE | Admit: 2023-06-23 | Discharge: 2023-06-23 | Disposition: A | Discharge status: Home / Self Care | Payer: Medicare Other | Source: Ambulatory Visit | Attending: Gastroenterology | Admitting: Gastroenterology

## 2023-06-23 DIAGNOSIS — R1112 Projectile vomiting: Secondary | ICD-10-CM | POA: Insufficient documentation

## 2023-06-25 NOTE — Progress Notes (Signed)
Hi Wendy ,  Can you please call the patient and tell the patient the esophagogram was a limited study and did not explain the cause of your abdominal discomfort  Continue to follow with [place] in the GI clinic if you have pain  Thanks,  Vista Lawman, MD Gastroenterology and Hepatology [facility] Gastroenterology  ===============  Radiologist report patient experiencing pain every time contrast was passing through the pylorus.  May discuss upper endoscopy and follow-up clinic visit after discussing risk-benefit and limitation

## 2023-06-25 NOTE — Progress Notes (Signed)
Hi Melissa Ross ,  Can you please call the patient and tell the patient the ultrasound shows fatty liver and a pancreatic cyst.  This does not explain your abdominal discomfort  Thanks,  Vista Lawman, MD Gastroenterology and Hepatology [facility] Gastroenterology ================= Pancreatic cyst, surveillance MRI is recommended by radiology.  We can see the patient in clinic and discuss risk, benefit indication as patient may not be a candidate for surveillance given advanced age

## 2023-06-30 ENCOUNTER — Encounter (HOSPITAL_COMMUNITY)
Admission: RE | Admit: 2023-06-30 | Discharge: 2023-06-30 | Disposition: A | Discharge status: Home / Self Care | Payer: Medicare Other | Source: Ambulatory Visit | Attending: Gastroenterology | Admitting: Gastroenterology

## 2023-06-30 ENCOUNTER — Telehealth (INDEPENDENT_AMBULATORY_CARE_PROVIDER_SITE_OTHER): Payer: Self-pay | Admitting: *Deleted

## 2023-06-30 DIAGNOSIS — K30 Functional dyspepsia: Secondary | ICD-10-CM | POA: Diagnosis not present

## 2023-06-30 DIAGNOSIS — R1112 Projectile vomiting: Secondary | ICD-10-CM | POA: Diagnosis not present

## 2023-06-30 MED ORDER — TECHNETIUM TC 99M SULFUR COLLOID
2.0000 | Freq: Once | INTRAVENOUS | Status: AC | PRN
Start: 2023-06-30 — End: 2023-06-30
  Administered 2023-06-30: 2 via ORAL

## 2023-06-30 NOTE — Telephone Encounter (Signed)
Patient walked in to get results of test. I discussed results and she told me she is having pale stools again. States it has been that way off and on for a couple of years. States brother passed away liver cancer. Has nausea and vomits sometimes. A lot of belching and water coming up.  Has been taking omeprazole every morning. States zofran helps some.   [phone]

## 2023-07-12 DIAGNOSIS — R609 Edema, unspecified: Secondary | ICD-10-CM | POA: Diagnosis not present

## 2023-07-12 DIAGNOSIS — N390 Urinary tract infection, site not specified: Secondary | ICD-10-CM | POA: Diagnosis not present

## 2023-07-12 DIAGNOSIS — Z299 Encounter for prophylactic measures, unspecified: Secondary | ICD-10-CM | POA: Diagnosis not present

## 2023-07-12 DIAGNOSIS — R600 Localized edema: Secondary | ICD-10-CM | POA: Diagnosis not present

## 2023-07-12 DIAGNOSIS — I10 Essential (primary) hypertension: Secondary | ICD-10-CM | POA: Diagnosis not present

## 2023-07-12 DIAGNOSIS — R350 Frequency of micturition: Secondary | ICD-10-CM | POA: Diagnosis not present

## 2023-07-12 DIAGNOSIS — M7989 Other specified soft tissue disorders: Secondary | ICD-10-CM | POA: Diagnosis not present

## 2023-07-20 DIAGNOSIS — I429 Cardiomyopathy, unspecified: Secondary | ICD-10-CM | POA: Diagnosis not present

## 2023-07-20 DIAGNOSIS — K4090 Unilateral inguinal hernia, without obstruction or gangrene, not specified as recurrent: Secondary | ICD-10-CM | POA: Diagnosis not present

## 2023-07-20 DIAGNOSIS — D692 Other nonthrombocytopenic purpura: Secondary | ICD-10-CM | POA: Diagnosis not present

## 2023-07-20 DIAGNOSIS — Z299 Encounter for prophylactic measures, unspecified: Secondary | ICD-10-CM | POA: Diagnosis not present

## 2023-07-20 DIAGNOSIS — I700 Atherosclerosis of aorta: Secondary | ICD-10-CM | POA: Diagnosis not present

## 2023-07-20 DIAGNOSIS — I10 Essential (primary) hypertension: Secondary | ICD-10-CM | POA: Diagnosis not present

## 2023-07-29 ENCOUNTER — Other Ambulatory Visit: Payer: Self-pay | Admitting: General Surgery

## 2023-07-29 DIAGNOSIS — Z8719 Personal history of other diseases of the digestive system: Secondary | ICD-10-CM | POA: Diagnosis not present

## 2023-07-29 DIAGNOSIS — Z9889 Other specified postprocedural states: Secondary | ICD-10-CM | POA: Diagnosis not present

## 2023-07-29 DIAGNOSIS — R1903 Right lower quadrant abdominal swelling, mass and lump: Secondary | ICD-10-CM | POA: Diagnosis not present

## 2023-07-29 DIAGNOSIS — R1904 Left lower quadrant abdominal swelling, mass and lump: Secondary | ICD-10-CM | POA: Diagnosis not present

## 2023-08-01 ENCOUNTER — Encounter: Payer: Self-pay | Admitting: General Surgery

## 2023-08-02 ENCOUNTER — Other Ambulatory Visit (HOSPITAL_COMMUNITY): Payer: Self-pay | Admitting: General Surgery

## 2023-08-02 DIAGNOSIS — Z8719 Personal history of other diseases of the digestive system: Secondary | ICD-10-CM

## 2023-08-10 ENCOUNTER — Telehealth (INDEPENDENT_AMBULATORY_CARE_PROVIDER_SITE_OTHER): Payer: Self-pay | Admitting: *Deleted

## 2023-08-10 ENCOUNTER — Ambulatory Visit (INDEPENDENT_AMBULATORY_CARE_PROVIDER_SITE_OTHER): Payer: Medicare Other | Admitting: Gastroenterology

## 2023-08-10 VITALS — BP 145/81 | HR 93 | Temp 98.6°F | Ht 65.0 in | Wt 154.1 lb

## 2023-08-10 DIAGNOSIS — K5904 Chronic idiopathic constipation: Secondary | ICD-10-CM

## 2023-08-10 DIAGNOSIS — R1112 Projectile vomiting: Secondary | ICD-10-CM | POA: Diagnosis not present

## 2023-08-10 DIAGNOSIS — I10 Essential (primary) hypertension: Secondary | ICD-10-CM | POA: Insufficient documentation

## 2023-08-10 DIAGNOSIS — K862 Cyst of pancreas: Secondary | ICD-10-CM | POA: Insufficient documentation

## 2023-08-10 NOTE — Patient Instructions (Signed)
It was very nice to meet you today, as dicussed with will plan for the following :  1) continue taking PPI 2) Please forward me report of the CT Scan

## 2023-08-10 NOTE — Telephone Encounter (Signed)
Dr. Tasia Catchings saw patient today and wanted CT ordered by Dr. Gaynelle Adu at [facility] to be changed to pancreatic protocol. Per Kenney Houseman Dr. Andrey Campanile would need to change the order since its his order. If he is not able to change order to add pancreatic protocol for pancreatic cyst then ok per Dr. Tasia Catchings to put order in his name. CT is scheduled for 2/4

## 2023-08-10 NOTE — Progress Notes (Signed)
Melissa Ross , M.D. Gastroenterology & Hepatology [facility] Gastroenterology [address] [place], [place] [address] Primary Care Physician: Ignatius Specking, MD [address] [place] [address]  Chief Complaint:  multiple complaints: persistent nausea/vomiting , constipation , abdominal discomfort , weakness , "abdominal knot "   History of Present Illness:  Melissa Ross is a 89 y.o. female with HTN , GERD , CAD who presents for evaluation of multiple complaints: persistent nausea/vomiting , constipation , abdominal discomfort , "abdominal knot " and pancreatic cyst   Patient was last seen December 2024 for multiple GI complaints by myself for nausea vomiting and abdominal discomfort with constipation and since then when under upper GI series, gastric emptying study and complete abdominal ultrasound  Fortunately today patient is seen in GI clinic and reports her symptoms are 99% better and no further nausea and vomiting and is only taking PPI and not Zofran.  Her constipation is much controlled with Metamucil and MiraLAX.  She is concerned about pancreatic cyst and the abdominal not on the right lower quadrant.   Last EGD:2013  Stricture at GE junction. Was dilated with a balloon to 19 mm. Moderate size sliding hiatal hernia. Few antral telangiectasia along with changes of nonerosive gastritis.  Last Colonoscopy:2017-incomplete colonoscopy   - The procedure was aborted due to the difficulty of the procedure. - Redundant colon. - Diverticulosis in the sigmoid colon, in the descending colon, at the splenic flexure and in the transverse colon. - One 7 mm polyp in the sigmoid colon, removed with a hot snare. Resected and retrieved. - Anal papilla( e) were hypertrophied.  UJW:[id number] for CRC and paternal uncle who was in his 21s or 59s at the time of diagnosis  Past Medical History: Past Medical History:  Diagnosis Date   Arthritis    Asthma     GERD (gastroesophageal reflux disease)    Hypertension    Myocardial infarction (HCC) 03/2013   Respiratory failure (HCC)    August 2016, requiring intubation   Ventral hernia     Past Surgical History: Past Surgical History:  Procedure Laterality Date   APPENDECTOMY     CATARACT EXTRACTION W/PHACO  11/09/2011   Procedure: CATARACT EXTRACTION PHACO AND INTRAOCULAR LENS PLACEMENT (IOC);  Surgeon: Loraine Leriche T. Nile Riggs, MD;  Location: [facility] ORS;  Service: Ophthalmology;  Laterality: Right;  CDE 10.01   CHOLECYSTECTOMY     COLONOSCOPY N/A 10/30/2015   Procedure: COLONOSCOPY;  Surgeon: Malissa Hippo, MD;  Location: [facility] ENDO SUITE;  Service: Endoscopy;  Laterality: N/A;  2:20 - moved to 4/20 @ 1:25   ESOPHAGOGASTRODUODENOSCOPY (EGD) WITH ESOPHAGEAL DILATION  05/11/2012   Procedure: ESOPHAGOGASTRODUODENOSCOPY (EGD) WITH ESOPHAGEAL DILATION;  Surgeon: Malissa Hippo, MD;  Location: [facility] ENDO SUITE;  Service: Endoscopy;  Laterality: N/A;   EYE SURGERY     left KPE   INSERTION OF MESH  07/17/2012   Procedure: INSERTION OF MESH;  Surgeon: Atilano Ina, MD,FACS;  Location: [facility] OR;  Service: General;  Laterality: N/A;   LEFT HEART CATHETERIZATION WITH CORONARY ANGIOGRAM N/A 03/19/2013   Procedure: LEFT HEART CATHETERIZATION WITH CORONARY ANGIOGRAM;  Surgeon: Robynn Pane, MD;  Location: [facility] CATH LAB;  Service: Cardiovascular;  Laterality: N/A;   TOE SURGERY     right foot   TONSILLECTOMY     TUBAL LIGATION     VENTRAL HERNIA REPAIR  07/17/2012   Procedure: LAPAROSCOPIC VENTRAL HERNIA;  Surgeon: Atilano Ina, MD,FACS;  Location: [facility]  OR;  Service: General;  Laterality: N/A;    Family History: Family History  Problem Relation Age of Onset   Heart disease Mother    Stroke Mother    Stroke Father    Heart disease Father    Hypertension Sister    Cancer Brother        liver   Breast cancer Other    Ovarian cancer Other    Colon cancer Other    Malignant hyperthermia Neg Hx    Pseudochol deficiency Neg  Hx    Hypotension Neg Hx    Anesthesia problems Neg Hx     Social History: Social History   Tobacco Use  Smoking Status Never  Smokeless Tobacco Never   Social History   Substance and Sexual Activity  Alcohol Use No   Social History   Substance and Sexual Activity  Drug Use No    Allergies: Allergies  Allergen Reactions   Neosporin [Neomycin-Bacitracin Zn-Polymyx] Other (See Comments)    blisters   Penicillins Swelling and Rash    Has patient had a PCN reaction causing immediate rash, facial/tongue/throat swelling, SOB or lightheadedness with hypotension:unknown Has patient had a PCN reaction causing severe rash involving mucus membranes or skin necrosis: unknown Has patient had a PCN reaction that required hospitalization: unknown Has patient had a PCN reaction occurring within the last 10 years: unknown If all of the above answers are "NO", then may proceed with Cephalosporin use.      Medications: Current Outpatient Medications  Medication Sig Dispense Refill   Cholecalciferol (VITAMIN D3 GUMMIES ADULT PO) Take by mouth.     nitroGLYCERIN (NITROSTAT) 0.4 MG SL tablet Place 1 tablet (0.4 mg total) under the tongue every 5 (five) minutes x 3 doses as needed for chest pain. 25 tablet 12   omeprazole (PRILOSEC) 40 MG capsule Take 1 capsule (40 mg total) by mouth daily. 90 capsule 3   polyethylene glycol (MIRALAX / GLYCOLAX) 17 g packet Take 17 g by mouth 2 (two) times daily. 180 packet 0   psyllium (METAMUCIL) 58.6 % packet Take 1 packet by mouth 2 (two) times daily. 60 packet 2   amLODipine (NORVASC) 5 MG tablet Take by mouth. (Patient not taking: Reported on 06/15/2023)     aspirin 81 MG chewable tablet Chew 1 tablet (81 mg total) by mouth daily. (Patient not taking: Reported on 06/15/2023) 30 tablet 3   atorvastatin (LIPITOR) 10 MG tablet Take 5 mg by mouth daily. (Patient not taking: Reported on 06/15/2023)  2   clopidogrel (PLAVIX) 75 MG tablet Take 75 mg by mouth  daily. (Patient not taking: Reported on 06/15/2023)     cyanocobalamin 100 MCG tablet Take 100 mcg by mouth daily. (Patient not taking: Reported on 06/15/2023)     lisinopril (ZESTRIL) 5 MG tablet Take 1 tablet (5 mg total) by mouth daily. (Patient not taking: Reported on 08/26/2021) 20 tablet 0   ondansetron (ZOFRAN-ODT) 8 MG disintegrating tablet Take 1 tablet (8 mg total) by mouth every 8 (eight) hours as needed for nausea or vomiting. (Patient not taking: Reported on 08/10/2023) 10 tablet 0   OVER THE COUNTER MEDICATION nuropathy (Patient not taking: Reported on 06/15/2023)     vitamin C (ASCORBIC ACID) 500 MG tablet Take 1,500 mg by mouth daily. (Patient not taking: Reported on 06/15/2023)     No current facility-administered medications for this visit.    Review of Systems: GENERAL: negative for malaise, night sweats HEENT: No changes in hearing or  vision, no nose bleeds or other nasal problems. NECK: Negative for lumps, goiter, pain and significant neck swelling RESPIRATORY: Negative for cough, wheezing CARDIOVASCULAR: Negative for chest pain, leg swelling, palpitations, orthopnea GI: SEE HPI MUSCULOSKELETAL: Negative for joint pain or swelling, back pain, and muscle pain. SKIN: Negative for lesions, rash HEMATOLOGY Negative for prolonged bleeding, bruising easily, and swollen nodes. ENDOCRINE: Negative for cold or heat intolerance, polyuria, polydipsia and goiter. NEURO: negative for tremor, gait imbalance, syncope and seizures. The remainder of the review of systems is noncontributory.   Physical Exam: There were no vitals taken for this visit. GENERAL: The patient is AO x3, in no acute distress. HEENT: Head is normocephalic and atraumatic. EOMI are intact. Mouth is well hydrated and without lesions. NECK: Supple. No masses LUNGS: Clear to auscultation. No presence of rhonchi/wheezing/rales. Adequate chest expansion HEART: RRR, normal s1 and s2. ABDOMEN: Soft, nontender, no  guarding, no peritoneal signs, and nondistended. BS +. No masses.  I do feel as solid nodule on right lower quadrant on deep palpation  Imaging/Labs: as above     Latest Ref Rng & Units 08/26/2021    2:07 PM 12/28/2019   12:49 PM 10/20/2017    2:16 PM  CBC  WBC 4.0 - 10.5 K/uL 5.6  5.3  6.5   Hemoglobin 12.0 - 15.0 g/dL 60.4  54.0  98.1   Hematocrit 36.0 - 46.0 % 43.7  41.7  37.4   Platelets 150 - 400 K/uL 283  276  266    No results found for: "IRON", "TIBC", "FERRITIN"  I personally reviewed and interpreted the available labs, imaging and endoscopic files.  Gastric emptying study   FINDINGS: Expected location of the stomach in the left upper quadrant. Ingested meal empties the stomach gradually over the course of the study.   35% emptied at 1 hr ( normal >= 10%)   66% emptied at 2 hr ( normal >= 40%)   80% emptied at 3 hr ( normal >= 70%)   80% emptied at 4 hr ( normal >= 90%)   IMPRESSION: Slightly delayed 4 hour gastric emptying.  UGI   IMPRESSION: Single contrast UGI significant for:   1.  Very limited examination due to patient disability   2.  Moderate esophageal dysmotility with tertiary contractions   3.  Small sliding type hiatal hernia   4. Normal gastric emptying with patient experiencing presenting symptoms each time contrast was seen passing through the pylorus.  Ultrasound Abdomen :  IMPRESSION: 1. Mildly increased hepatic parenchymal echogenicity suggestive of steatosis. 2. 9 mm probable cyst within the pancreas. Recommend follow-up pre and post contrast-enhanced abdominal MRI in 1 year.   IMPRESSION: 1. Mild wall thickening in the mid stomach. Gastritis would be a consideration. 2. Small hiatal hernia. 3. Diffuse colonic diverticulosis without diverticulitis. 4. Aortic Atherosclerosis (ICD10-I70.0).  2017 virtual colonoscopy   IMPRESSION: Suspected 7 mm colonic polyp on a fold in the rectum, 8 mm from the anal verge. Otherwise, no  significant colonic polypoid lesion, mass, or stricture.   Extensive colonic diverticulosis, without evidence of diverticulitis.   Punctate nonobstructing right lower pole renal calculus. No ureteral or bladder calculi. No hydronephrosis. Otherwise unremarkable unenhanced CT abdomen/pelvis.  Blood work from 02/2023 hemoglobin 12.6 platelet 265 WBC 4.8 creatinine 1.15 alk phos 131 previously 173 normal AST ALT T. bili 0.5 TSH 2.5  Impression and Plan:  Melissa Ross is a 89 y.o. female with HTN , GERD , CAD who presents for evaluation  of multiple complaints: persistent nausea/vomiting , constipation , abdominal discomfort , weakness , "abdominal knot "  #Multiple Gi complaints : nausea and vomiting -resolved #RLQ Nodule/lesion   Since last presentation patient underwent multiple studies including gastric emptying study demonstrating slight gastroparesis ( 80% emptying rather than 90%) , upper GI series without any overt lesion but demonstrates patient having pain whenever food passes by the pylorus and ultrasound demonstrating fatty liver disease and pancreatic cyst  Since starting PPI patient symptoms of nausea and vomiting have resolved  Last upper endoscopy 2013 where patient presented with episodes of self resolving dysphagia  This could be uncontrolled GERD,partial gastric outlet obstruction and malignancy needs to be ruled out  Last CT scan from 08/2021 without any overt malignancy.   On exam deep palpation I do feel a right lower quadrant nontender nodules  Discussed risk-benefit indication and limitation of upper endoscopy.  I discussed with patient without upper endoscopy would not be able to rule out lesion such as malignancy.  Patient would like to forego any invasive procedures at this time given risk of anesthesia and procedure  If symptoms recur we will rediscuss upper endoscopy  PPI daily 30 minutes for symptomatic relief  Patient has a CT scan ordered by Dr.  Gaynelle Adu and will evaluate for any overt lesion  # Incomplete colonoscopy   There appears patient never had a complete colonoscopy as last colonoscopy was in 2017 by Dr. Karilyn Cota which was not completed due to difficult tortuous colon.  Given advanced age after discussing risk-benefit limitation of colonoscopy patient have opted out to proceed with any colonoscopy at this time  #Constipation - improved   Previously patient with severe constipation but with addition of MiraLAX Metamucil is well-managed Ensure adequate fluid intake: Aim for 8 glasses of water daily. Follow a high fiber diet: Include foods such as dates, prunes, pears, and kiwi. Take Miralax twice a day for the first week, then reduce to once daily thereafter. Use Metamucil twice a day.  #Pancreatic cyst   Abdominal ultrasound demonstrated pancreatic cyst.  Discussed with patient that pancreatic cyst are not necessarily considered benign and require further imaging. Although we discussed the utility of imaging and if this appears to be premalignant and malignant would require surgery ( [facility]) .  Given patient preference and ACG guideline we will forego any surveillance at this time  "Patients who are not medically fit for surgery should not undergo further evaluation of incidentally found pancreatic cysts, irrespective of cyst size (Strong recommendation, low quality of evidence)"  Although patient has a CT abdomen pelvis with IV contrast ordered we will reach out to radiology to make this pancreatic protocol so we can assess the nature of the cyst  #HTN The patient was found to have elevated blood pressure when vital signs were checked in the office. The blood pressure was rechecked by the nursing staff and it was found be persistently elevated >140/90 mmHg. I personally advised to the patient to follow up closely with PCP for hypertension control.    All questions were answered.      Melissa Lawman,  MD Gastroenterology and Hepatology [facility] Gastroenterology   This chart has been completed using [facility] Dictation software, and while attempts have been made to ensure accuracy , certain words and phrases may not be transcribed as intended

## 2023-08-11 NOTE — Telephone Encounter (Signed)
Joni Reining from Dr. Tawana Scale office at [facility] said she spoke to Dr. Andrey Campanile and he could not change the ct order to pancreatic protocol because it would not show what Dr. Andrey Campanile would need to see. Do you want to order CT pancreatic protocol in your name and have done separately?

## 2023-08-11 NOTE — Telephone Encounter (Signed)
Ok will hold message til results are available

## 2023-08-11 NOTE — Telephone Encounter (Signed)
Hi, thank you for following up on this . Lets wait to get the CT done which is ordered by Dr Fabio Pierce , depending on the results we may decide to obtain another CT

## 2023-08-16 ENCOUNTER — Ambulatory Visit (HOSPITAL_BASED_OUTPATIENT_CLINIC_OR_DEPARTMENT_OTHER)
Admission: RE | Admit: 2023-08-16 | Discharge: 2023-08-16 | Disposition: A | Discharge status: Home / Self Care | Payer: Medicare Other | Source: Ambulatory Visit | Attending: General Surgery | Admitting: General Surgery

## 2023-08-16 DIAGNOSIS — K8689 Other specified diseases of pancreas: Secondary | ICD-10-CM | POA: Diagnosis not present

## 2023-08-16 DIAGNOSIS — R102 Pelvic and perineal pain: Secondary | ICD-10-CM | POA: Insufficient documentation

## 2023-08-16 DIAGNOSIS — Z8719 Personal history of other diseases of the digestive system: Secondary | ICD-10-CM

## 2023-08-16 DIAGNOSIS — K5730 Diverticulosis of large intestine without perforation or abscess without bleeding: Secondary | ICD-10-CM | POA: Insufficient documentation

## 2023-08-16 DIAGNOSIS — D045 Carcinoma in situ of skin of trunk: Secondary | ICD-10-CM | POA: Insufficient documentation

## 2023-08-16 DIAGNOSIS — I700 Atherosclerosis of aorta: Secondary | ICD-10-CM | POA: Diagnosis not present

## 2023-08-16 DIAGNOSIS — Z9889 Other specified postprocedural states: Secondary | ICD-10-CM | POA: Diagnosis not present

## 2023-08-16 DIAGNOSIS — K439 Ventral hernia without obstruction or gangrene: Secondary | ICD-10-CM | POA: Diagnosis not present

## 2023-08-16 LAB — POCT I-STAT CREATININE: Creatinine, Ser: 1.3 mg/dL — ABNORMAL HIGH (ref 0.44–1.00)

## 2023-08-16 MED ORDER — IOHEXOL 300 MG/ML  SOLN
100.0000 mL | Freq: Once | INTRAMUSCULAR | Status: AC | PRN
  Administered 2023-08-16: 80 mL via INTRAVENOUS

## 2023-09-02 NOTE — Telephone Encounter (Signed)
CT ordered by Dr. Andrey Campanile is now finalized and in epic for you to review.

## 2023-09-05 NOTE — Telephone Encounter (Signed)
 Per Mitzie she spoke with the patient spouse and let him know of the appointment on 09/07/2023 with Dr. Tasia Catchings. She says the patient is supposed to call her back to confirm the appointment date and time.

## 2023-09-07 ENCOUNTER — Ambulatory Visit (INDEPENDENT_AMBULATORY_CARE_PROVIDER_SITE_OTHER): Payer: Medicare Other | Admitting: Gastroenterology

## 2023-09-07 ENCOUNTER — Encounter (INDEPENDENT_AMBULATORY_CARE_PROVIDER_SITE_OTHER): Payer: Self-pay | Admitting: Gastroenterology

## 2023-09-07 VITALS — BP 135/79 | HR 87 | Temp 97.6°F | Ht 65.0 in | Wt 157.0 lb

## 2023-09-07 DIAGNOSIS — K5904 Chronic idiopathic constipation: Secondary | ICD-10-CM | POA: Diagnosis not present

## 2023-09-07 DIAGNOSIS — R935 Abnormal findings on diagnostic imaging of other abdominal regions, including retroperitoneum: Secondary | ICD-10-CM | POA: Diagnosis not present

## 2023-09-07 DIAGNOSIS — K219 Gastro-esophageal reflux disease without esophagitis: Secondary | ICD-10-CM | POA: Diagnosis not present

## 2023-09-07 DIAGNOSIS — R1112 Projectile vomiting: Secondary | ICD-10-CM | POA: Diagnosis not present

## 2023-09-07 DIAGNOSIS — K862 Cyst of pancreas: Secondary | ICD-10-CM

## 2023-09-07 NOTE — Telephone Encounter (Signed)
 Pt seen in office today.

## 2023-09-07 NOTE — Progress Notes (Signed)
 Vista Lawman , M.D. Gastroenterology & Hepatology [facility] Gastroenterology [address] [place], [place] [address] Primary Care Physician: Ignatius Specking, MD [address] [place] [address]  Chief Complaint:  multiple complaints: t nausea/vomiting , constipation , abdominal discomfort , weakness , "abdominal knot " , Follow up on CT ABDOMEN   History of Present Illness:  Melissa Ross is a 89 y.o. female with HTN , GERD , CAD who initially presented for evaluation of multiple complaints: persistent nausea/vomiting , constipation , abdominal discomfort , "abdominal knot " and pancreatic cyst .  Patient had a CT abdomen showing gastric wall thickening and here to discuss options  Patient was last seen jan 2025 for multiple GI complaints by myself for nausea vomiting and abdominal discomfort with constipation and since then when under upper GI series, gastric emptying study and complete abdominal ultrasound. Fortunately today patient is seen in GI clinic and reports her symptoms have resolved  and no further nausea and vomiting and is only taking PPI and not Zofran.  Her constipation is much controlled with Metamucil and MiraLAX.  Although patient does have early satiety    Last EGD:2013  Stricture at GE junction. Was dilated with a balloon to 19 mm. Moderate size sliding hiatal hernia. Few antral telangiectasia along with changes of nonerosive gastritis.  Last Colonoscopy:2017-incomplete colonoscopy   - The procedure was aborted due to the difficulty of the procedure. - Redundant colon. - Diverticulosis in the sigmoid colon, in the descending colon, at the splenic flexure and in the transverse colon. - One 7 mm polyp in the sigmoid colon, removed with a hot snare. Resected and retrieved. - Anal papilla( e) were hypertrophied.  UJW:[id number] for CRC and paternal uncle who was in his 70s or 44s at the time of diagnosis  Past Medical History: Past  Medical History:  Diagnosis Date   Arthritis    Asthma    GERD (gastroesophageal reflux disease)    Hypertension    Myocardial infarction (HCC) 03/2013   Respiratory failure (HCC)    August 2016, requiring intubation   Ventral hernia     Past Surgical History: Past Surgical History:  Procedure Laterality Date   APPENDECTOMY     CATARACT EXTRACTION W/PHACO  11/09/2011   Procedure: CATARACT EXTRACTION PHACO AND INTRAOCULAR LENS PLACEMENT (IOC);  Surgeon: Loraine Leriche T. Nile Riggs, MD;  Location: [facility] ORS;  Service: Ophthalmology;  Laterality: Right;  CDE 10.01   CHOLECYSTECTOMY     COLONOSCOPY N/A 10/30/2015   Procedure: COLONOSCOPY;  Surgeon: Malissa Hippo, MD;  Location: [facility] END[facility] SUITE;  Service: Endoscopy;  Laterality: N/A;  2:20 - moved to 4/20 @ 1:25   ESOPHAGOGASTRODUODENOSCOPY (EGD) WITH ESOPHAGEAL DILATION  05/11/2012   Procedure: ESOPHAGOGASTRODUODENOSCOPY (EGD) WITH ESOPHAGEAL DILATION;  Surgeon: Malissa Hippo, MD;  Location: [facility];  Service: Endoscopy;  Laterality: N/A;   EYE SURGERY     left KPE   INSERTION OF MESH  07/17/2012   Procedure: INSERTION OF MESH;  Surgeon: Atilano Ina, MD,FACS;  Location: [facility] OR;  Service: General;  Laterality: N/A;   LEFT HEART CATHETERIZATION WITH CORONARY ANGIOGRAM N/A 03/19/2013   Procedure: LEFT HEART CATHETERIZATION WITH CORONARY ANGIOGRAM;  Surgeon: Robynn Pane, MD;  Location: [facility] CATH LAB;  Service: Cardiovascular;  Laterality: N/A;   TOE SURGERY     right foot   TONSILLECTOMY     TUBAL LIGATION     VENTRAL HERNIA REPAIR  07/17/2012  Procedure: LAPAROSCOPIC VENTRAL HERNIA;  Surgeon: Atilano Ina, MD,FACS;  Location: [facility] OR;  Service: General;  Laterality: N/A;    Family History: Family History  Problem Relation Age of Onset   Heart disease Mother    Stroke Mother    Stroke Father    Heart disease Father    Hypertension Sister    Cancer Brother        liver   Breast cancer Other    Ovarian cancer Other    Colon cancer Other     Malignant hyperthermia Neg Hx    Pseudochol deficiency Neg Hx    Hypotension Neg Hx    Anesthesia problems Neg Hx     Social History: Social History   Tobacco Use  Smoking Status Never  Smokeless Tobacco Never   Social History   Substance and Sexual Activity  Alcohol Use No   Social History   Substance and Sexual Activity  Drug Use No    Allergies: Allergies  Allergen Reactions   Neosporin [Neomycin-Bacitracin Zn-Polymyx] Other (See Comments)    blisters   Penicillins Swelling and Rash    Has patient had a PCN reaction causing immediate rash, facial/tongue/throat swelling, SOB or lightheadedness with hypotension:unknown Has patient had a PCN reaction causing severe rash involving mucus membranes or skin necrosis: unknown Has patient had a PCN reaction that required hospitalization: unknown Has patient had a PCN reaction occurring within the last 10 years: unknown If all of the above answers are "NO", then may proceed with Cephalosporin use.      Medications: Current Outpatient Medications  Medication Sig Dispense Refill   nitroGLYCERIN (NITROSTAT) 0.4 MG SL tablet Place 1 tablet (0.4 mg total) under the tongue every 5 (five) minutes x 3 doses as needed for chest pain. 25 tablet 12   omeprazole (PRILOSEC) 40 MG capsule Take 1 capsule (40 mg total) by mouth daily. 90 capsule 3   amLODipine (NORVASC) 5 MG tablet Take by mouth. (Patient not taking: Reported on 06/15/2023)     aspirin 81 MG chewable tablet Chew 1 tablet (81 mg total) by mouth daily. (Patient not taking: Reported on 06/15/2023) 30 tablet 3   atorvastatin (LIPITOR) 10 MG tablet Take 5 mg by mouth daily. (Patient not taking: Reported on 06/15/2023)  2   Cholecalciferol (VITAMIN D3 GUMMIES ADULT PO) Take by mouth. (Patient not taking: Reported on 09/07/2023)     clopidogrel (PLAVIX) 75 MG tablet Take 75 mg by mouth daily. (Patient not taking: Reported on 06/15/2023)     cyanocobalamin 100 MCG tablet Take 100 mcg  by mouth daily. (Patient not taking: Reported on 06/15/2023)     lisinopril (ZESTRIL) 5 MG tablet Take 1 tablet (5 mg total) by mouth daily. (Patient not taking: Reported on 08/26/2021) 20 tablet 0   ondansetron (ZOFRAN-ODT) 8 MG disintegrating tablet Take 1 tablet (8 mg total) by mouth every 8 (eight) hours as needed for nausea or vomiting. (Patient not taking: Reported on 09/07/2023) 10 tablet 0   OVER THE COUNTER MEDICATION nuropathy (Patient not taking: Reported on 06/15/2023)     polyethylene glycol (MIRALAX / GLYCOLAX) 17 g packet Take 17 g by mouth 2 (two) times daily. (Patient not taking: Reported on 09/07/2023) 180 packet 0   psyllium (METAMUCIL) 58.6 % packet Take 1 packet by mouth 2 (two) times daily. (Patient not taking: Reported on 09/07/2023) 60 packet 2   vitamin C (ASCORBIC ACID) 500 MG tablet Take 1,500 mg by mouth daily. (Patient not taking: Reported  on 06/15/2023)     No current facility-administered medications for this visit.    Review of Systems: GENERAL: negative for malaise, night sweats HEENT: No changes in hearing or vision, no nose bleeds or other nasal problems. NECK: Negative for lumps, goiter, pain and significant neck swelling RESPIRATORY: Negative for cough, wheezing CARDIOVASCULAR: Negative for chest pain, leg swelling, palpitations, orthopnea GI: SEE HPI MUSCULOSKELETAL: Negative for joint pain or swelling, back pain, and muscle pain. SKIN: Negative for lesions, rash HEMATOLOGY Negative for prolonged bleeding, bruising easily, and swollen nodes. ENDOCRINE: Negative for cold or heat intolerance, polyuria, polydipsia and goiter. NEURO: negative for tremor, gait imbalance, syncope and seizures. The remainder of the review of systems is noncontributory.   Physical Exam: BP 135/79   Pulse 87   Temp 97.6 F (36.4 C) (Oral)   Ht 5\' 5"  (1.651 m)   Wt 157 lb (71.2 kg)   BMI 26.13 kg/m  GENERAL: The patient is AO x3, in no acute distress. HEENT: Head is  normocephalic and atraumatic. EOMI are intact. Mouth is well hydrated and without lesions. NECK: Supple. No masses LUNGS: Clear to auscultation. No presence of rhonchi/wheezing/rales. Adequate chest expansion HEART: RRR, normal s1 and s2. ABDOMEN: Soft, nontender, no guarding, no peritoneal signs, and nondistended. BS +. No masses.  I do feel as solid nodule on right lower quadrant on deep palpation  Imaging/Labs: as above     Latest Ref Rng & Units 08/26/2021    2:07 PM 12/28/2019   12:49 PM 10/20/2017    2:16 PM  CBC  WBC 4.0 - 10.5 K/uL 5.6  5.3  6.5   Hemoglobin 12.0 - 15.0 g/dL 46.9  62.9  52.8   Hematocrit 36.0 - 46.0 % 43.7  41.7  37.4   Platelets 150 - 400 K/uL 283  276  266    No results found for: "IRON", "TIBC", "FERRITIN"  I personally reviewed and interpreted the available labs, imaging and endoscopic files.  08/2023   IMPRESSION: 1. Eccentric gastric wall thickening and hyperenhancement along the lesser curvature, mass or inflammation could give this appearance. 2. Fatty enlargement of the bilateral upper groin compatible with fatty hernias. Stable appearance of the treated midline hernia without recurrence. 3. Advanced atherosclerosis, colonic diverticulosis, and pelvic Paget's.    Gastric emptying study   FINDINGS: Expected location of the stomach in the left upper quadrant. Ingested meal empties the stomach gradually over the course of the study.   35% emptied at 1 hr ( normal >= 10%)   66% emptied at 2 hr ( normal >= 40%)   80% emptied at 3 hr ( normal >= 70%)   80% emptied at 4 hr ( normal >= 90%)   IMPRESSION: Slightly delayed 4 hour gastric emptying.  UGI   IMPRESSION: Single contrast UGI significant for:   1.  Very limited examination due to patient disability   2.  Moderate esophageal dysmotility with tertiary contractions   3.  Small sliding type hiatal hernia   4. Normal gastric emptying with patient experiencing  presenting symptoms each time contrast was seen passing through the pylorus.  Ultrasound Abdomen :  IMPRESSION: 1. Mildly increased hepatic parenchymal echogenicity suggestive of steatosis. 2. 9 mm probable cyst within the pancreas. Recommend follow-up pre and post contrast-enhanced abdominal MRI in 1 year.   IMPRESSION: 1. Mild wall thickening in the mid stomach. Gastritis would be a consideration. 2. Small hiatal hernia. 3. Diffuse colonic diverticulosis without diverticulitis. 4. Aortic Atherosclerosis (ICD10-I70.0).  2017 virtual colonoscopy   IMPRESSION: Suspected 7 mm colonic polyp on a fold in the rectum, 8 mm from the anal verge. Otherwise, no significant colonic polypoid lesion, mass, or stricture.   Extensive colonic diverticulosis, without evidence of diverticulitis.   Punctate nonobstructing right lower pole renal calculus. No ureteral or bladder calculi. No hydronephrosis. Otherwise unremarkable unenhanced CT abdomen/pelvis.  Blood work from 02/2023 hemoglobin 12.6 platelet 265 WBC 4.8 creatinine 1.15 alk phos 131 previously 173 normal AST ALT T. bili 0.5 TSH 2.5  Impression and Plan:   ELLIANAH CORDY is a 89 y.o. female with HTN , GERD , CAD who initially presented for evaluation of multiple complaints: persistent nausea/vomiting , constipation , abdominal discomfort , "abdominal knot " and pancreatic cyst .  Patient had a CT abdomen showing gastric wall thickening and here to discuss options  #Abnormal CT : Gastric wall thickening #Early satiety   CT demonstrated Eccentric gastric wall thickening and hyperenhancement along the lesser curvature   I had an extensive discussion with the patient and her daughter-in-law in clinic [facility] that this gastric wall thickening could be gastritis , underdistention but given early satiety , advance age I am more concerned about gastric malignancy which I cannot exclude without upper endoscopy  After  discussing risk, benefit, limitation of upper endoscopy and anesthesia patient would like to defer any endoscopic evaluation. Patient and her daughter-in-law both verbalizes understanding that without direct upper endoscopy I cannot safely rule out malignancy here   Patient will reach out to [place] if she chooses to change her mind and pursue upper endoscopy   #Multiple Gi complaints : nausea and vomiting -resolved #RLQ Nodule/lesion   Since last presentation patient underwent multiple studies including gastric emptying study demonstrating slight gastroparesis ( 80% emptying rather than 90%) , upper GI series without any overt lesion but demonstrates patient having pain whenever food passes by the pylorus and ultrasound demonstrating fatty liver disease and pancreatic cyst  Since starting PPI patient symptoms of nausea and vomiting have resolved  Last upper endoscopy 2013 where patient presented with episodes of self resolving dysphagia  This could be uncontrolled GERD,partial gastric outlet obstruction and malignancy needs to be ruled out  Discussed risk-benefit indication and limitation of upper endoscopy.  I discussed with patient without upper endoscopy would not be able to rule out lesion such as malignancy.  Patient would like to forego any invasive procedures at this time given risk of anesthesia and procedure  PPI daily 30 minutes for symptomatic relief  # Incomplete colonoscopy   There appears patient never had a complete colonoscopy as last colonoscopy was in 2017 by Dr. Karilyn Cota which was not completed due to difficult tortuous colon.  Given advanced age after discussing risk-benefit limitation of colonoscopy patient have opted out to proceed with any colonoscopy at this time  #Constipation - improved   Previously patient with severe constipation but with addition of MiraLAX Metamucil is well-managed Ensure adequate fluid intake: Aim for 8 glasses of water daily. Follow a high fiber  diet: Include foods such as dates, prunes, pears, and kiwi. Take Miralax twice a day for the first week, then reduce to once daily thereafter. Use Metamucil twice a day.  #Pancreatic cyst   Abdominal ultrasound demonstrated pancreatic cyst.  Discussed with patient that pancreatic cyst are not necessarily considered benign and require further imaging. Although we discussed the utility of imaging and if this appears to be premalignant and malignant would require surgery ( [facility]) .  Given patient preference and ACG guideline we will forego any surveillance at this time  "Patients who are not medically fit for surgery should not undergo further evaluation of incidentally found pancreatic cysts, irrespective of cyst size (Strong recommendation, low quality of evidence)"  Recent CT did not mention any pancreatic cyst but shows atrophy   All questions were answered.      Vista Lawman, MD Gastroenterology and Hepatology [facility] Gastroenterology   This chart has been completed using [facility] Dictation software, and while attempts have been made to ensure accuracy , certain words and phrases may not be transcribed as intended

## 2023-09-12 DIAGNOSIS — H353132 Nonexudative age-related macular degeneration, bilateral, intermediate dry stage: Secondary | ICD-10-CM | POA: Diagnosis not present

## 2023-09-23 DIAGNOSIS — Z299 Encounter for prophylactic measures, unspecified: Secondary | ICD-10-CM | POA: Diagnosis not present

## 2023-09-23 DIAGNOSIS — I10 Essential (primary) hypertension: Secondary | ICD-10-CM | POA: Diagnosis not present

## 2023-09-23 DIAGNOSIS — I700 Atherosclerosis of aorta: Secondary | ICD-10-CM | POA: Diagnosis not present

## 2023-09-23 DIAGNOSIS — H9202 Otalgia, left ear: Secondary | ICD-10-CM | POA: Diagnosis not present

## 2023-09-23 DIAGNOSIS — I429 Cardiomyopathy, unspecified: Secondary | ICD-10-CM | POA: Diagnosis not present

## 2023-10-03 DIAGNOSIS — R0981 Nasal congestion: Secondary | ICD-10-CM | POA: Diagnosis not present

## 2023-10-03 DIAGNOSIS — J069 Acute upper respiratory infection, unspecified: Secondary | ICD-10-CM | POA: Diagnosis not present

## 2023-10-03 DIAGNOSIS — Z299 Encounter for prophylactic measures, unspecified: Secondary | ICD-10-CM | POA: Diagnosis not present

## 2023-10-11 DIAGNOSIS — I429 Cardiomyopathy, unspecified: Secondary | ICD-10-CM | POA: Diagnosis not present

## 2023-10-11 DIAGNOSIS — Z0000 Encounter for general adult medical examination without abnormal findings: Secondary | ICD-10-CM | POA: Diagnosis not present

## 2023-10-11 DIAGNOSIS — I10 Essential (primary) hypertension: Secondary | ICD-10-CM | POA: Diagnosis not present

## 2023-10-11 DIAGNOSIS — Z7189 Other specified counseling: Secondary | ICD-10-CM | POA: Diagnosis not present

## 2023-10-11 DIAGNOSIS — I739 Peripheral vascular disease, unspecified: Secondary | ICD-10-CM | POA: Diagnosis not present

## 2023-10-11 DIAGNOSIS — Z299 Encounter for prophylactic measures, unspecified: Secondary | ICD-10-CM | POA: Diagnosis not present

## 2023-10-19 DIAGNOSIS — H353132 Nonexudative age-related macular degeneration, bilateral, intermediate dry stage: Secondary | ICD-10-CM | POA: Diagnosis not present

## 2023-11-15 DIAGNOSIS — I10 Essential (primary) hypertension: Secondary | ICD-10-CM | POA: Diagnosis not present

## 2023-11-15 DIAGNOSIS — M179 Osteoarthritis of knee, unspecified: Secondary | ICD-10-CM | POA: Diagnosis not present

## 2023-11-15 DIAGNOSIS — R296 Repeated falls: Secondary | ICD-10-CM | POA: Diagnosis not present

## 2023-11-15 DIAGNOSIS — Z299 Encounter for prophylactic measures, unspecified: Secondary | ICD-10-CM | POA: Diagnosis not present

## 2024-01-30 DIAGNOSIS — I429 Cardiomyopathy, unspecified: Secondary | ICD-10-CM | POA: Diagnosis not present

## 2024-01-30 DIAGNOSIS — I739 Peripheral vascular disease, unspecified: Secondary | ICD-10-CM | POA: Diagnosis not present

## 2024-01-30 DIAGNOSIS — I10 Essential (primary) hypertension: Secondary | ICD-10-CM | POA: Diagnosis not present

## 2024-01-30 DIAGNOSIS — Z299 Encounter for prophylactic measures, unspecified: Secondary | ICD-10-CM | POA: Diagnosis not present

## 2024-01-30 DIAGNOSIS — R296 Repeated falls: Secondary | ICD-10-CM | POA: Diagnosis not present

## 2024-02-20 ENCOUNTER — Encounter (INDEPENDENT_AMBULATORY_CARE_PROVIDER_SITE_OTHER): Payer: Self-pay | Admitting: Gastroenterology

## 2024-02-20 ENCOUNTER — Telehealth: Payer: Self-pay | Admitting: *Deleted

## 2024-02-20 ENCOUNTER — Ambulatory Visit (INDEPENDENT_AMBULATORY_CARE_PROVIDER_SITE_OTHER): Admitting: Gastroenterology

## 2024-02-20 VITALS — BP 129/72 | HR 80 | Temp 98.1°F | Ht 65.0 in | Wt 150.7 lb

## 2024-02-20 DIAGNOSIS — K5900 Constipation, unspecified: Secondary | ICD-10-CM

## 2024-02-20 DIAGNOSIS — R935 Abnormal findings on diagnostic imaging of other abdominal regions, including retroperitoneum: Secondary | ICD-10-CM | POA: Diagnosis not present

## 2024-02-20 DIAGNOSIS — K862 Cyst of pancreas: Secondary | ICD-10-CM

## 2024-02-20 DIAGNOSIS — K219 Gastro-esophageal reflux disease without esophagitis: Secondary | ICD-10-CM | POA: Diagnosis not present

## 2024-02-20 DIAGNOSIS — R1112 Projectile vomiting: Secondary | ICD-10-CM

## 2024-02-20 DIAGNOSIS — R112 Nausea with vomiting, unspecified: Secondary | ICD-10-CM

## 2024-02-20 DIAGNOSIS — K5904 Chronic idiopathic constipation: Secondary | ICD-10-CM

## 2024-02-20 MED ORDER — POLYETHYLENE GLYCOL 3350 17 GM/SCOOP PO POWD
8.5000 g | Freq: Every day | ORAL | Status: DC
Start: 2024-02-20 — End: 2024-03-16

## 2024-02-20 MED ORDER — OMEPRAZOLE 40 MG PO CPDR: 40.0000 mg | DELAYED_RELEASE_CAPSULE | Freq: Every day | ORAL | 3 refills | Status: DC

## 2024-02-20 MED ORDER — ONDANSETRON 8 MG PO TBDP
8.0000 mg | ORAL_TABLET | Freq: Three times a day (TID) | ORAL | 0 refills | Status: DC | PRN
Start: 2024-02-20 — End: 2024-03-01

## 2024-02-20 NOTE — Telephone Encounter (Signed)
 Patient is needing clearance to hold plavix  x 5 days prior to procedure   02/20/24  Melissa Ross 01-05-1931  What type of surgery is being performed? EGD  When is surgery scheduled? tbd  What type of clearance is required (medical or pharmacy to hold medication or both? medication  Are there any medications that need to be held prior to surgery and how long? Plavix   5 days  Name of physician performing surgery?  Dr. Cinderella Rouse Gastroenterology at [facility] Phone: [phone] Fax: [phone]  Anethesia type (none, local, MAC, general)? MAC

## 2024-02-20 NOTE — H&P (View-Only) (Signed)
 [name] [name] , M.D. Gastroenterology & Hepatology [facility] [address] [place], [place] [address] Primary Care Physician: [name], MD [address] [place] [address]  Chief Complaint:  multiple complaints: t nausea/vomiting , constipation , abdominal discomfort , weakness , abdominal knot  , Follow up on CT ABDOMEN   History of Present Illness:  Melissa Ross is a 89 y.o. female with HTN , GERD , CAD who initially presented for evaluation of multiple complaints: persistent nausea/vomiting , constipation , abdominal discomfort , abdominal knot  and pancreatic cyst .  Patient had a CT abdomen showing gastric wall thickening and here to discuss options  Patient was last seen Feb 2025 for multiple GI complaints by myself for nausea vomiting and abdominal discomfort with constipation and since then when under upper GI series, gastric emptying study and complete abdominal ultrasound.  Her constipation is much controlled with Metamucil and MiraLAX .  Although patient does have early satiety .  Patient continues to have nausea and intermittent vomiting with early satiety.  She takes omeprazole  daily with some relief but does not take Zofran    Last EGD:2013  Stricture at GE junction. Was dilated with a balloon to 19 mm. Moderate size sliding hiatal hernia. Few antral telangiectasia along with changes of nonerosive gastritis.  Last Colonoscopy:2017-incomplete colonoscopy   - The procedure was aborted due to the difficulty of the procedure. - Redundant colon. - Diverticulosis in the sigmoid colon, in the descending colon, at the splenic flexure and in the transverse colon. - One 7 mm polyp in the sigmoid colon, removed with a hot snare. Resected and retrieved. - Anal papilla( e) were hypertrophied.  QYk:[id number] for CRC and paternal uncle who was in his 36s or 91s at the time of diagnosis  Past Medical History: Past Medical  History:  Diagnosis Date   Arthritis    Asthma    GERD (gastroesophageal reflux disease)    Hypertension    Myocardial infarction (HCC) 03/2013   Respiratory failure (HCC)    August 2016, requiring intubation   Ventral hernia     Past Surgical History: Past Surgical History:  Procedure Laterality Date   APPENDECTOMY     CATARACT EXTRACTION W/PHACO  11/09/2011   Procedure: CATARACT EXTRACTION PHACO AND INTRAOCULAR LENS PLACEMENT (IOC);  Surgeon: Oneil T. Roz, MD;  Location: [facility] ORS;  Service: Ophthalmology;  Laterality: Right;  CDE 10.01   CHOLECYSTECTOMY     COLONOSCOPY N/A 10/30/2015   Procedure: COLONOSCOPY;  Surgeon: Claudis RAYMOND Rivet, MD;  Location: [facility] END[facility];  Service: Endoscopy;  Laterality: N/A;  2:20 - moved to 4/20 @ 1:25   ESOPHAGOGASTRODUODENOSCOPY (EGD) WITH ESOPHAGEAL DILATION  05/11/2012   Procedure: ESOPHAGOGASTRODUODENOSCOPY (EGD) WITH ESOPHAGEAL DILATION;  Surgeon: Claudis RAYMOND Rivet, MD;  Location: [facility];  Service: Endoscopy;  Laterality: N/A;   EYE SURGERY     left KPE   INSERTION OF MESH  07/17/2012   Procedure: INSERTION OF MESH;  Surgeon: Camellia CHRISTELLA Blush, MD,FACS;  Location: [facility] OR;  Service: General;  Laterality: N/A;   LEFT HEART CATHETERIZATION WITH CORONARY ANGIOGRAM N/A 03/19/2013   Procedure: LEFT HEART CATHETERIZATION WITH CORONARY ANGIOGRAM;  Surgeon: Rober LOISE Chroman, MD;  Location: [facility] CATH LAB;  Service: Cardiovascular;  Laterality: N/A;   TOE SURGERY     right foot   TONSILLECTOMY     TUBAL LIGATION     VENTRAL HERNIA REPAIR  07/17/2012   Procedure: LAPAROSCOPIC VENTRAL HERNIA;  Surgeon: Camellia CHRISTELLA Blush, MD,FACS;  [facility];  Service: General;  Laterality: N/A;    Family History: Family History  Problem Relation Age of Onset   Heart disease Mother    Stroke Mother    Stroke Father    Heart disease Father    Hypertension Sister    Cancer Brother        liver   Breast cancer Other    Ovarian cancer Other    Colon cancer Other     Malignant hyperthermia Neg Hx    Pseudochol deficiency Neg Hx    Hypotension Neg Hx    Anesthesia problems Neg Hx     Social History: Social History   Tobacco Use  Smoking Status Never  Smokeless Tobacco Never   Social History   Substance and Sexual Activity  Alcohol Use No   Social History   Substance and Sexual Activity  Drug Use No    Allergies: Allergies  Allergen Reactions   Neosporin [Neomycin-Bacitracin Zn-Polymyx] Other (See Comments)    blisters   Penicillins Swelling and Rash    Has patient had a PCN reaction causing immediate rash, facial/tongue/throat swelling, SOB or lightheadedness with hypotension:unknown Has patient had a PCN reaction causing severe rash involving mucus membranes or skin necrosis: unknown Has patient had a PCN reaction that required hospitalization: unknown Has patient had a PCN reaction occurring within the last 10 years: unknown If all of the above answers are NO, then may proceed with Cephalosporin use.      Medications: Current Outpatient Medications  Medication Sig Dispense Refill   amLODipine (NORVASC) 5 MG tablet Take by mouth.     aspirin  81 MG chewable tablet Chew 1 tablet (81 mg total) by mouth daily. 30 tablet 3   atorvastatin  (LIPITOR) 10 MG tablet Take 5 mg by mouth daily.  2   Cholecalciferol (VITAMIN D3 GUMMIES ADULT PO) Take by mouth.     clopidogrel  (PLAVIX ) 75 MG tablet Take 75 mg by mouth daily.     cyanocobalamin 100 MCG tablet Take 100 mcg by mouth daily.     lisinopril  (ZESTRIL ) 5 MG tablet Take 1 tablet (5 mg total) by mouth daily. 20 tablet 0   nitroGLYCERIN  (NITROSTAT ) 0.4 MG SL tablet Place 1 tablet (0.4 mg total) under the tongue every 5 (five) minutes x 3 doses as needed for chest pain. 25 tablet 12   OVER THE COUNTER MEDICATION nuropathy     polyethylene glycol powder (GLYCOLAX /MIRALAX ) 17 GM/SCOOP powder Take 8.5 g by mouth daily.     vitamin C (ASCORBIC ACID) 500 MG tablet Take 1,500 mg by mouth  daily.     omeprazole  (PRILOSEC) 40 MG capsule Take 1 capsule (40 mg total) by mouth daily. 90 capsule 3   ondansetron  (ZOFRAN -ODT) 8 MG disintegrating tablet Take 1 tablet (8 mg total) by mouth every 8 (eight) hours as needed for nausea or vomiting. 10 tablet 0   No current facility-administered medications for this visit.    Review of Systems: GENERAL: negative for malaise, night sweats HEENT: No changes in hearing or vision, no nose bleeds or other nasal problems. NECK: Negative for lumps, goiter, pain and significant neck swelling RESPIRATORY: Negative for cough, wheezing CARDIOVASCULAR: Negative for chest pain, leg swelling, palpitations, orthopnea GI: SEE HPI MUSCULOSKELETAL: Negative for joint pain or swelling, back pain, and muscle pain. SKIN: Negative for lesions, rash HEMATOLOGY Negative for prolonged bleeding, bruising easily, and swollen nodes. ENDOCRINE: Negative for cold or heat intolerance, polyuria,  polydipsia and goiter. NEURO: negative for tremor, gait imbalance, syncope and seizures. The remainder of the review of systems is noncontributory.   Physical Exam: BP 129/72   Pulse 80   Temp 98.1 F (36.7 C)   Ht 5' 5 (1.651 m)   Wt 150 lb 11.2 oz (68.4 kg)   BMI 25.08 kg/m  GENERAL: The patient is AO x3, in no acute distress. HEENT: Head is normocephalic and atraumatic. EOMI are intact. Mouth is well hydrated and without lesions. NECK: Supple. No masses LUNGS: Clear to auscultation. No presence of rhonchi/wheezing/rales. Adequate chest expansion HEART: RRR, normal s1 and s2. ABDOMEN: Soft, nontender, no guarding, no peritoneal signs, and nondistended. BS +. No masses.  I do feel as solid nodule on right lower quadrant on deep palpation  Imaging/Labs: as above     Latest Ref Rng & Units 08/26/2021    2:07 PM 12/28/2019   12:49 PM 10/20/2017    2:16 PM  CBC  WBC 4.0 - 10.5 K/uL 5.6  5.3  6.5   Hemoglobin 12.0 - 15.0 g/dL 85.4  86.7  87.7   Hematocrit 36.0 -  46.0 % 43.7  41.7  37.4   Platelets 150 - 400 K/uL 283  276  266    No results found for: IRON, TIBC, FERRITIN  I personally reviewed and interpreted the available labs, imaging and endoscopic files.  08/2023   IMPRESSION: 1. Eccentric gastric wall thickening and hyperenhancement along the lesser curvature, mass or inflammation could give this appearance. 2. Fatty enlargement of the bilateral upper groin compatible with fatty hernias. Stable appearance of the treated midline hernia without recurrence. 3. Advanced atherosclerosis, colonic diverticulosis, and pelvic Paget's.    Gastric emptying study   FINDINGS: Expected location of the stomach in the left upper quadrant. Ingested meal empties the stomach gradually over the course of the study.   35% emptied at 1 hr ( normal >= 10%)   66% emptied at 2 hr ( normal >= 40%)   80% emptied at 3 hr ( normal >= 70%)   80% emptied at 4 hr ( normal >= 90%)   IMPRESSION: Slightly delayed 4 hour gastric emptying.  UGI   IMPRESSION: Single contrast UGI significant for:   1.  Very limited examination due to patient disability   2.  Moderate esophageal dysmotility with tertiary contractions   3.  Small sliding type hiatal hernia   4. Normal gastric emptying with patient experiencing presenting symptoms each time contrast was seen passing through the pylorus.  Ultrasound Abdomen :  IMPRESSION: 1. Mildly increased hepatic parenchymal echogenicity suggestive of steatosis. 2. 9 mm probable cyst within the pancreas. Recommend follow-up pre and post contrast-enhanced abdominal MRI in 1 year.   IMPRESSION: 1. Mild wall thickening in the mid stomach. Gastritis would be a consideration. 2. Small hiatal hernia. 3. Diffuse colonic diverticulosis without diverticulitis. 4. Aortic Atherosclerosis (ICD10-I70.0).  2017 virtual colonoscopy   IMPRESSION: Suspected 7 mm colonic polyp on a fold in the rectum, 8 mm from  the anal verge. Otherwise, no significant colonic polypoid lesion, mass, or stricture.   Extensive colonic diverticulosis, without evidence of diverticulitis.   Punctate nonobstructing right lower pole renal calculus. No ureteral or bladder calculi. No hydronephrosis. Otherwise unremarkable unenhanced CT abdomen/pelvis.  Blood work from 02/2023 hemoglobin 12.6 platelet 265 WBC 4.8 creatinine 1.15 alk phos 131 previously 173 normal AST ALT T. bili 0.5 TSH 2.5  Impression and Plan:   CRISTIN PENAFLOR is a 89  y.o. female with HTN , GERD , CAD who initially presented for evaluation of multiple complaints: persistent nausea/vomiting , constipation , abdominal discomfort , abdominal knot  and pancreatic cyst .  Patient had a CT abdomen showing gastric wall thickening and here to discuss options  #Abnormal CT : Gastric wall thickening #Early satiety   CT demonstrated Eccentric gastric wall thickening and hyperenhancement along the lesser curvature   I had an extensive discussion with the patient and Son in clinic today  that this gastric wall thickening could be gastritis , underdistention but given early satiety , advance age I am more concerned about gastric malignancy which I cannot exclude without upper endoscopy  After discussing risk, benefit, limitation of upper endoscopy and anesthesia patient would like to proceed with endoscopic evaluation; given persistent symptoms  If pyloric stenosis is encountered we will also perform dilation at the same time  I thoroughly discussed with the patient the procedure, including the risks involved. Patient understands what the procedure involves including the benefits and any risks. Patient understands alternatives to the proposed procedure. Risks including (but not limited to) bleeding, tearing of the lining (perforation), rupture of adjacent organs, problems with heart and lung function, infection, and medication reactions. A small percentage of  complications may require surgery, hospitalization, repeat endoscopic procedure, and/or transfusion.  Patient understood and agreed.    #Multiple Gi complaints : nausea and vomiting  #RLQ Nodule/lesion   Since last presentation patient underwent multiple studies including gastric emptying study demonstrating slight gastroparesis ( 80% emptying rather than 90%) , upper GI series without any overt lesion but demonstrates patient having pain whenever food passes by the pylorus and ultrasound demonstrating fatty liver disease and pancreatic cyst  Last upper endoscopy 2013 where patient presented with episodes of self resolving dysphagia  This could be uncontrolled GERD,partial gastric outlet obstruction and malignancy needs to be ruled out  Discussed risk-benefit indication and limitation of upper endoscopy.  I discussed with patient without upper endoscopy would not be able to rule out lesion such as malignancy.  Patient would like to forego any invasive procedures at this time given risk of anesthesia and procedure  PPI daily 30 minutes for symptomatic relief With Zofran  as needed  # Incomplete colonoscopy   There appears patient never had a complete colonoscopy as last colonoscopy was in 2017 by Dr. Golda which was not completed due to difficult tortuous colon.  Given advanced age after discussing risk-benefit limitation of colonoscopy patient have opted out to proceed with any colonoscopy at this time  #Constipation   Previously patient with severe constipation but with addition of MiraLAX  Metamucil is well-managed Ensure adequate fluid intake: Aim for 8 glasses of water  daily. Follow a high fiber diet: Include foods such as dates, prunes, pears, and kiwi. Take Miralax  twice a day for the first week, then reduce to once daily thereafter. Use Metamucil twice a day.  #Pancreatic cyst   Abdominal ultrasound demonstrated pancreatic cyst.  Discussed with patient that pancreatic cyst are  not necessarily considered benign and require further imaging. Although we discussed the utility of imaging and if this appears to be premalignant and malignant would require surgery ( [place]) .  Given patient preference and ACG guideline we will forego any surveillance at this time  Patients who are not medically fit for surgery should not undergo further evaluation of incidentally found pancreatic cysts, irrespective of cyst size (Strong recommendation, low quality of evidence)  Recent CT did not mention any pancreatic cyst  but shows atrophy   All questions were answered.      Keedan Sample Faizan Semiyah Newgent, MD Gastroenterology and Hepatology [facility]   This chart has been completed using [facility] Dictation software, and while attempts have been made to ensure accuracy , certain words and phrases may not be transcribed as intended

## 2024-02-20 NOTE — Progress Notes (Signed)
 Melissa Ross , M.D. Gastroenterology & Hepatology [facility] [address] [place], [place] [address] Primary Care Physician: [name], MD [address] [place] [address]  Chief Complaint:  multiple complaints: t nausea/vomiting , constipation , abdominal discomfort , weakness , abdominal knot  , Follow up on CT ABDOMEN   History of Present Illness:  Melissa Ross is a 89 y.o. female with HTN , GERD , CAD who initially presented for evaluation of multiple complaints: persistent nausea/vomiting , constipation , abdominal discomfort , abdominal knot  and pancreatic cyst .  Patient had a CT abdomen showing gastric wall thickening and here to discuss options  Patient was last seen Feb 2025 for multiple GI complaints by myself for nausea vomiting and abdominal discomfort with constipation and since then when under upper GI series, gastric emptying study and complete abdominal ultrasound.  Her constipation is much controlled with Metamucil and MiraLAX .  Although patient does have early satiety .  Patient continues to have nausea and intermittent vomiting with early satiety.  She takes omeprazole  daily with some relief but does not take Zofran    Last EGD:2013  Stricture at GE junction. Was dilated with a balloon to 19 mm. Moderate size sliding hiatal hernia. Few antral telangiectasia along with changes of nonerosive gastritis.  Last Colonoscopy:2017-incomplete colonoscopy   - The procedure was aborted due to the difficulty of the procedure. - Redundant colon. - Diverticulosis in the sigmoid colon, in the descending colon, at the splenic flexure and in the transverse colon. - One 7 mm polyp in the sigmoid colon, removed with a hot snare. Resected and retrieved. - Anal papilla( e) were hypertrophied.  QYk:[id number] for CRC and paternal uncle who was in his 36s or 91s at the time of diagnosis  Past Medical History: Past Medical  History:  Diagnosis Date   Arthritis    Asthma    GERD (gastroesophageal reflux disease)    Hypertension    Myocardial infarction (HCC) 03/2013   Respiratory failure (HCC)    August 2016, requiring intubation   Ventral hernia     Past Surgical History: Past Surgical History:  Procedure Laterality Date   APPENDECTOMY     CATARACT EXTRACTION W/PHACO  11/09/2011   Procedure: CATARACT EXTRACTION PHACO AND INTRAOCULAR LENS PLACEMENT (IOC);  Surgeon: Oneil T. Roz, MD;  Location: [facility] ORS;  Service: Ophthalmology;  Laterality: Right;  CDE 10.01   CHOLECYSTECTOMY     COLONOSCOPY N/A 10/30/2015   Procedure: COLONOSCOPY;  Surgeon: Claudis RAYMOND Rivet, MD;  Location: [facility] END[facility];  Service: Endoscopy;  Laterality: N/A;  2:20 - moved to 4/20 @ 1:25   ESOPHAGOGASTRODUODENOSCOPY (EGD) WITH ESOPHAGEAL DILATION  05/11/2012   Procedure: ESOPHAGOGASTRODUODENOSCOPY (EGD) WITH ESOPHAGEAL DILATION;  Surgeon: Claudis RAYMOND Rivet, MD;  Location: [facility];  Service: Endoscopy;  Laterality: N/A;   EYE SURGERY     left KPE   INSERTION OF MESH  07/17/2012   Procedure: INSERTION OF MESH;  Surgeon: Camellia CHRISTELLA Blush, MD,FACS;  Location: [facility] OR;  Service: General;  Laterality: N/A;   LEFT HEART CATHETERIZATION WITH CORONARY ANGIOGRAM N/A 03/19/2013   Procedure: LEFT HEART CATHETERIZATION WITH CORONARY ANGIOGRAM;  Surgeon: Rober LOISE Chroman, MD;  Location: [facility] CATH LAB;  Service: Cardiovascular;  Laterality: N/A;   TOE SURGERY     right foot   TONSILLECTOMY     TUBAL LIGATION     VENTRAL HERNIA REPAIR  07/17/2012   Procedure: LAPAROSCOPIC VENTRAL HERNIA;  Surgeon: Camellia CHRISTELLA Blush, MD,FACS;  [facility];  Service: General;  Laterality: N/A;    Family History: Family History  Problem Relation Age of Onset   Heart disease Mother    Stroke Mother    Stroke Father    Heart disease Father    Hypertension Sister    Cancer Brother        liver   Breast cancer Other    Ovarian cancer Other    Colon cancer Other     Malignant hyperthermia Neg Hx    Pseudochol deficiency Neg Hx    Hypotension Neg Hx    Anesthesia problems Neg Hx     Social History: Social History   Tobacco Use  Smoking Status Never  Smokeless Tobacco Never   Social History   Substance and Sexual Activity  Alcohol Use No   Social History   Substance and Sexual Activity  Drug Use No    Allergies: Allergies  Allergen Reactions   Neosporin [Neomycin-Bacitracin Zn-Polymyx] Other (See Comments)    blisters   Penicillins Swelling and Rash    Has patient had a PCN reaction causing immediate rash, facial/tongue/throat swelling, SOB or lightheadedness with hypotension:unknown Has patient had a PCN reaction causing severe rash involving mucus membranes or skin necrosis: unknown Has patient had a PCN reaction that required hospitalization: unknown Has patient had a PCN reaction occurring within the last 10 years: unknown If all of the above answers are NO, then may proceed with Cephalosporin use.      Medications: Current Outpatient Medications  Medication Sig Dispense Refill   amLODipine (NORVASC) 5 MG tablet Take by mouth.     aspirin  81 MG chewable tablet Chew 1 tablet (81 mg total) by mouth daily. 30 tablet 3   atorvastatin  (LIPITOR) 10 MG tablet Take 5 mg by mouth daily.  2   Cholecalciferol (VITAMIN D3 GUMMIES ADULT PO) Take by mouth.     clopidogrel  (PLAVIX ) 75 MG tablet Take 75 mg by mouth daily.     cyanocobalamin 100 MCG tablet Take 100 mcg by mouth daily.     lisinopril  (ZESTRIL ) 5 MG tablet Take 1 tablet (5 mg total) by mouth daily. 20 tablet 0   nitroGLYCERIN  (NITROSTAT ) 0.4 MG SL tablet Place 1 tablet (0.4 mg total) under the tongue every 5 (five) minutes x 3 doses as needed for chest pain. 25 tablet 12   OVER THE COUNTER MEDICATION nuropathy     polyethylene glycol powder (GLYCOLAX /MIRALAX ) 17 GM/SCOOP powder Take 8.5 g by mouth daily.     vitamin C (ASCORBIC ACID) 500 MG tablet Take 1,500 mg by mouth  daily.     omeprazole  (PRILOSEC) 40 MG capsule Take 1 capsule (40 mg total) by mouth daily. 90 capsule 3   ondansetron  (ZOFRAN -ODT) 8 MG disintegrating tablet Take 1 tablet (8 mg total) by mouth every 8 (eight) hours as needed for nausea or vomiting. 10 tablet 0   No current facility-administered medications for this visit.    Review of Systems: GENERAL: negative for malaise, night sweats HEENT: No changes in hearing or vision, no nose bleeds or other nasal problems. NECK: Negative for lumps, goiter, pain and significant neck swelling RESPIRATORY: Negative for cough, wheezing CARDIOVASCULAR: Negative for chest pain, leg swelling, palpitations, orthopnea GI: SEE HPI MUSCULOSKELETAL: Negative for joint pain or swelling, back pain, and muscle pain. SKIN: Negative for lesions, rash HEMATOLOGY Negative for prolonged bleeding, bruising easily, and swollen nodes. ENDOCRINE: Negative for cold or heat intolerance, polyuria,  polydipsia and goiter. NEURO: negative for tremor, gait imbalance, syncope and seizures. The remainder of the review of systems is noncontributory.   Physical Exam: BP 129/72   Pulse 80   Temp 98.1 F (36.7 C)   Ht 5' 5 (1.651 m)   Wt 150 lb 11.2 oz (68.4 kg)   BMI 25.08 kg/m  GENERAL: The patient is AO x3, in no acute distress. HEENT: Head is normocephalic and atraumatic. EOMI are intact. Mouth is well hydrated and without lesions. NECK: Supple. No masses LUNGS: Clear to auscultation. No presence of rhonchi/wheezing/rales. Adequate chest expansion HEART: RRR, normal s1 and s2. ABDOMEN: Soft, nontender, no guarding, no peritoneal signs, and nondistended. BS +. No masses.  I do feel as solid nodule on right lower quadrant on deep palpation  Imaging/Labs: as above     Latest Ref Rng & Units 08/26/2021    2:07 PM 12/28/2019   12:49 PM 10/20/2017    2:16 PM  CBC  WBC 4.0 - 10.5 K/uL 5.6  5.3  6.5   Hemoglobin 12.0 - 15.0 g/dL 85.4  86.7  87.7   Hematocrit 36.0 -  46.0 % 43.7  41.7  37.4   Platelets 150 - 400 K/uL 283  276  266    No results found for: IRON, TIBC, FERRITIN  I personally reviewed and interpreted the available labs, imaging and endoscopic files.  08/2023   IMPRESSION: 1. Eccentric gastric wall thickening and hyperenhancement along the lesser curvature, mass or inflammation could give this appearance. 2. Fatty enlargement of the bilateral upper groin compatible with fatty hernias. Stable appearance of the treated midline hernia without recurrence. 3. Advanced atherosclerosis, colonic diverticulosis, and pelvic Paget's.    Gastric emptying study   FINDINGS: Expected location of the stomach in the left upper quadrant. Ingested meal empties the stomach gradually over the course of the study.   35% emptied at 1 hr ( normal >= 10%)   66% emptied at 2 hr ( normal >= 40%)   80% emptied at 3 hr ( normal >= 70%)   80% emptied at 4 hr ( normal >= 90%)   IMPRESSION: Slightly delayed 4 hour gastric emptying.  UGI   IMPRESSION: Single contrast UGI significant for:   1.  Very limited examination due to patient disability   2.  Moderate esophageal dysmotility with tertiary contractions   3.  Small sliding type hiatal hernia   4. Normal gastric emptying with patient experiencing presenting symptoms each time contrast was seen passing through the pylorus.  Ultrasound Abdomen :  IMPRESSION: 1. Mildly increased hepatic parenchymal echogenicity suggestive of steatosis. 2. 9 mm probable cyst within the pancreas. Recommend follow-up pre and post contrast-enhanced abdominal MRI in 1 year.   IMPRESSION: 1. Mild wall thickening in the mid stomach. Gastritis would be a consideration. 2. Small hiatal hernia. 3. Diffuse colonic diverticulosis without diverticulitis. 4. Aortic Atherosclerosis (ICD10-I70.0).  2017 virtual colonoscopy   IMPRESSION: Suspected 7 mm colonic polyp on a fold in the rectum, 8 mm from  the anal verge. Otherwise, no significant colonic polypoid lesion, mass, or stricture.   Extensive colonic diverticulosis, without evidence of diverticulitis.   Punctate nonobstructing right lower pole renal calculus. No ureteral or bladder calculi. No hydronephrosis. Otherwise unremarkable unenhanced CT abdomen/pelvis.  Blood work from 02/2023 hemoglobin 12.6 platelet 265 WBC 4.8 creatinine 1.15 alk phos 131 previously 173 normal AST ALT T. bili 0.5 TSH 2.5  Impression and Plan:   Melissa Ross is a 89  y.o. female with HTN , GERD , CAD who initially presented for evaluation of multiple complaints: persistent nausea/vomiting , constipation , abdominal discomfort , abdominal knot  and pancreatic cyst .  Patient had a CT abdomen showing gastric wall thickening and here to discuss options  #Abnormal CT : Gastric wall thickening #Early satiety   CT demonstrated Eccentric gastric wall thickening and hyperenhancement along the lesser curvature   I had an extensive discussion with the patient and Son in clinic today  that this gastric wall thickening could be gastritis , underdistention but given early satiety , advance age I am more concerned about gastric malignancy which I cannot exclude without upper endoscopy  After discussing risk, benefit, limitation of upper endoscopy and anesthesia patient would like to proceed with endoscopic evaluation; given persistent symptoms  If pyloric stenosis is encountered we will also perform dilation at the same time  I thoroughly discussed with the patient the procedure, including the risks involved. Patient understands what the procedure involves including the benefits and any risks. Patient understands alternatives to the proposed procedure. Risks including (but not limited to) bleeding, tearing of the lining (perforation), rupture of adjacent organs, problems with heart and lung function, infection, and medication reactions. A small percentage of  complications may require surgery, hospitalization, repeat endoscopic procedure, and/or transfusion.  Patient understood and agreed.    #Multiple Gi complaints : nausea and vomiting  #RLQ Nodule/lesion   Since last presentation patient underwent multiple studies including gastric emptying study demonstrating slight gastroparesis ( 80% emptying rather than 90%) , upper GI series without any overt lesion but demonstrates patient having pain whenever food passes by the pylorus and ultrasound demonstrating fatty liver disease and pancreatic cyst  Last upper endoscopy 2013 where patient presented with episodes of self resolving dysphagia  This could be uncontrolled GERD,partial gastric outlet obstruction and malignancy needs to be ruled out  Discussed risk-benefit indication and limitation of upper endoscopy.  I discussed with patient without upper endoscopy would not be able to rule out lesion such as malignancy.  Patient would like to forego any invasive procedures at this time given risk of anesthesia and procedure  PPI daily 30 minutes for symptomatic relief With Zofran  as needed  # Incomplete colonoscopy   There appears patient never had a complete colonoscopy as last colonoscopy was in 2017 by Dr. Golda which was not completed due to difficult tortuous colon.  Given advanced age after discussing risk-benefit limitation of colonoscopy patient have opted out to proceed with any colonoscopy at this time  #Constipation   Previously patient with severe constipation but with addition of MiraLAX  Metamucil is well-managed Ensure adequate fluid intake: Aim for 8 glasses of water  daily. Follow a high fiber diet: Include foods such as dates, prunes, pears, and kiwi. Take Miralax  twice a day for the first week, then reduce to once daily thereafter. Use Metamucil twice a day.  #Pancreatic cyst   Abdominal ultrasound demonstrated pancreatic cyst.  Discussed with patient that pancreatic cyst are  not necessarily considered benign and require further imaging. Although we discussed the utility of imaging and if this appears to be premalignant and malignant would require surgery ( [place]) .  Given patient preference and ACG guideline we will forego any surveillance at this time  Patients who are not medically fit for surgery should not undergo further evaluation of incidentally found pancreatic cysts, irrespective of cyst size (Strong recommendation, low quality of evidence)  Recent CT did not mention any pancreatic cyst  but shows atrophy   All questions were answered.      Keedan Sample Faizan Semiyah Newgent, MD Gastroenterology and Hepatology [facility]   This chart has been completed using [facility] Dictation software, and while attempts have been made to ensure accuracy , certain words and phrases may not be transcribed as intended

## 2024-02-20 NOTE — Progress Notes (Signed)
 [name] [name] , M.D. Gastroenterology & Hepatology [facility] [address] [place], [place] [address] Primary Care Physician: [name], MD [address] [place] [address]  Chief Complaint:  multiple complaints: t nausea/vomiting , constipation , abdominal discomfort , weakness , abdominal knot  , Follow up on CT ABDOMEN   History of Present Illness:  Melissa Ross is a 89 y.o. female with HTN , GERD , CAD who initially presented for evaluation of multiple complaints: persistent nausea/vomiting , constipation , abdominal discomfort , abdominal knot  and pancreatic cyst .  Patient had a CT abdomen showing gastric wall thickening and here to discuss options  Patient was last seen Feb 2025 for multiple GI complaints by myself for nausea vomiting and abdominal discomfort with constipation and since then when under upper GI series, gastric emptying study and complete abdominal ultrasound.  Her constipation is much controlled with Metamucil and MiraLAX .  Although patient does have early satiety .  Patient continues to have nausea and intermittent vomiting with early satiety.  She takes omeprazole  daily with some relief but does not take Zofran    Last EGD:2013  Stricture at GE junction. Was dilated with a balloon to 19 mm. Moderate size sliding hiatal hernia. Few antral telangiectasia along with changes of nonerosive gastritis.  Last Colonoscopy:2017-incomplete colonoscopy   - The procedure was aborted due to the difficulty of the procedure. - Redundant colon. - Diverticulosis in the sigmoid colon, in the descending colon, at the splenic flexure and in the transverse colon. - One 7 mm polyp in the sigmoid colon, removed with a hot snare. Resected and retrieved. - Anal papilla( e) were hypertrophied.  QYk:dphwpqprjwu for CRC and paternal uncle who was in his 36s or 91s at the time of diagnosis  Past Medical History: Past Medical  History:  Diagnosis Date   Arthritis    Asthma    GERD (gastroesophageal reflux disease)    Hypertension    Myocardial infarction (HCC) 03/2013   Respiratory failure (HCC)    August 2016, requiring intubation   Ventral hernia     Past Surgical History: Past Surgical History:  Procedure Laterality Date   APPENDECTOMY     CATARACT EXTRACTION W/PHACO  11/09/2011   Procedure: CATARACT EXTRACTION PHACO AND INTRAOCULAR LENS PLACEMENT (IOC);  Surgeon: Oneil T. Roz, MD;  Location: [facility] ORS;  Service: Ophthalmology;  Laterality: Right;  CDE 10.01   CHOLECYSTECTOMY     COLONOSCOPY N/A 10/30/2015   Procedure: COLONOSCOPY;  Surgeon: Claudis RAYMOND Rivet, MD;  Location: [facility] ENDO SUITE;  Service: Endoscopy;  Laterality: N/A;  2:20 - moved to 4/20 @ 1:25   ESOPHAGOGASTRODUODENOSCOPY (EGD) WITH ESOPHAGEAL DILATION  05/11/2012   Procedure: ESOPHAGOGASTRODUODENOSCOPY (EGD) WITH ESOPHAGEAL DILATION;  Surgeon: Claudis RAYMOND Rivet, MD;  Location: [facility] END[facility] SUITE;  Service: Endoscopy;  Laterality: N/A;   EYE SURGERY     left KPE   INSERTION OF MESH  07/17/2012   Procedure: INSERTION OF MESH;  Surgeon: Camellia CHRISTELLA Blush, MD,FACS;  Location: [facility] OR;  Service: General;  Laterality: N/A;   LEFT HEART CATHETERIZATION WITH CORONARY ANGIOGRAM N/A 03/19/2013   Procedure: LEFT HEART CATHETERIZATION WITH CORONARY ANGIOGRAM;  Surgeon: Rober LOISE Chroman, MD;  Location: [facility] CATH LAB;  Service: Cardiovascular;  Laterality: N/A;   TOE SURGERY     right foot   TONSILLECTOMY     TUBAL LIGATION     VENTRAL HERNIA REPAIR  07/17/2012   Procedure: LAPAROSCOPIC VENTRAL HERNIA;  Surgeon: Camellia CHRISTELLA Blush, MD,FACS;  [facility];  Service: General;  Laterality: N/A;    Family History: Family History  Problem Relation Age of Onset   Heart disease Mother    Stroke Mother    Stroke Father    Heart disease Father    Hypertension Sister    Cancer Brother        liver   Breast cancer Other    Ovarian cancer Other    Colon cancer Other     Malignant hyperthermia Neg Hx    Pseudochol deficiency Neg Hx    Hypotension Neg Hx    Anesthesia problems Neg Hx     Social History: Social History   Tobacco Use  Smoking Status Never  Smokeless Tobacco Never   Social History   Substance and Sexual Activity  Alcohol Use No   Social History   Substance and Sexual Activity  Drug Use No    Allergies: Allergies  Allergen Reactions   Neosporin [Neomycin-Bacitracin Zn-Polymyx] Other (See Comments)    blisters   Penicillins Swelling and Rash    Has patient had a PCN reaction causing immediate rash, facial/tongue/throat swelling, SOB or lightheadedness with hypotension:unknown Has patient had a PCN reaction causing severe rash involving mucus membranes or skin necrosis: unknown Has patient had a PCN reaction that required hospitalization: unknown Has patient had a PCN reaction occurring within the last 10 years: unknown If all of the above answers are NO, then may proceed with Cephalosporin use.      Medications: Current Outpatient Medications  Medication Sig Dispense Refill   amLODipine (NORVASC) 5 MG tablet Take by mouth.     aspirin  81 MG chewable tablet Chew 1 tablet (81 mg total) by mouth daily. 30 tablet 3   atorvastatin  (LIPITOR) 10 MG tablet Take 5 mg by mouth daily.  2   Cholecalciferol (VITAMIN D3 GUMMIES ADULT PO) Take by mouth.     clopidogrel  (PLAVIX ) 75 MG tablet Take 75 mg by mouth daily.     cyanocobalamin 100 MCG tablet Take 100 mcg by mouth daily.     lisinopril  (ZESTRIL ) 5 MG tablet Take 1 tablet (5 mg total) by mouth daily. 20 tablet 0   nitroGLYCERIN  (NITROSTAT ) 0.4 MG SL tablet Place 1 tablet (0.4 mg total) under the tongue every 5 (five) minutes x 3 doses as needed for chest pain. 25 tablet 12   OVER THE COUNTER MEDICATION nuropathy     polyethylene glycol powder (GLYCOLAX /MIRALAX ) 17 GM/SCOOP powder Take 8.5 g by mouth daily.     vitamin C (ASCORBIC ACID) 500 MG tablet Take 1,500 mg by mouth  daily.     omeprazole  (PRILOSEC) 40 MG capsule Take 1 capsule (40 mg total) by mouth daily. 90 capsule 3   ondansetron  (ZOFRAN -ODT) 8 MG disintegrating tablet Take 1 tablet (8 mg total) by mouth every 8 (eight) hours as needed for nausea or vomiting. 10 tablet 0   No current facility-administered medications for this visit.    Review of Systems: GENERAL: negative for malaise, night sweats HEENT: No changes in hearing or vision, no nose bleeds or other nasal problems. NECK: Negative for lumps, goiter, pain and significant neck swelling RESPIRATORY: Negative for cough, wheezing CARDIOVASCULAR: Negative for chest pain, leg swelling, palpitations, orthopnea GI: SEE HPI MUSCULOSKELETAL: Negative for joint pain or swelling, back pain, and muscle pain. SKIN: Negative for lesions, rash HEMATOLOGY Negative for prolonged bleeding, bruising easily, and swollen nodes. ENDOCRINE: Negative for cold or heat intolerance, polyuria,  polydipsia and goiter. NEURO: negative for tremor, gait imbalance, syncope and seizures. The remainder of the review of systems is noncontributory.   Physical Exam: BP 129/72   Pulse 80   Temp 98.1 F (36.7 C)   Ht 5' 5 (1.651 m)   Wt 150 lb 11.2 oz (68.4 kg)   BMI 25.08 kg/m  GENERAL: The patient is AO x3, in no acute distress. HEENT: Head is normocephalic and atraumatic. EOMI are intact. Mouth is well hydrated and without lesions. NECK: Supple. No masses LUNGS: Clear to auscultation. No presence of rhonchi/wheezing/rales. Adequate chest expansion HEART: RRR, normal s1 and s2. ABDOMEN: Soft, nontender, no guarding, no peritoneal signs, and nondistended. BS +. No masses.  I do feel as solid nodule on right lower quadrant on deep palpation  Imaging/Labs: as above     Latest Ref Rng & Units 08/26/2021    2:07 PM 12/28/2019   12:49 PM 10/20/2017    2:16 PM  CBC  WBC 4.0 - 10.5 K/uL 5.6  5.3  6.5   Hemoglobin 12.0 - 15.0 g/dL 85.4  86.7  87.7   Hematocrit 36.0 -  46.0 % 43.7  41.7  37.4   Platelets 150 - 400 K/uL 283  276  266    No results found for: IRON, TIBC, FERRITIN  I personally reviewed and interpreted the available labs, imaging and endoscopic files.  08/2023   IMPRESSION: 1. Eccentric gastric wall thickening and hyperenhancement along the lesser curvature, mass or inflammation could give this appearance. 2. Fatty enlargement of the bilateral upper groin compatible with fatty hernias. Stable appearance of the treated midline hernia without recurrence. 3. Advanced atherosclerosis, colonic diverticulosis, and pelvic Paget's.    Gastric emptying study   FINDINGS: Expected location of the stomach in the left upper quadrant. Ingested meal empties the stomach gradually over the course of the study.   35% emptied at 1 hr ( normal >= 10%)   66% emptied at 2 hr ( normal >= 40%)   80% emptied at 3 hr ( normal >= 70%)   80% emptied at 4 hr ( normal >= 90%)   IMPRESSION: Slightly delayed 4 hour gastric emptying.  UGI   IMPRESSION: Single contrast UGI significant for:   1.  Very limited examination due to patient disability   2.  Moderate esophageal dysmotility with tertiary contractions   3.  Small sliding type hiatal hernia   4. Normal gastric emptying with patient experiencing presenting symptoms each time contrast was seen passing through the pylorus.  Ultrasound Abdomen :  IMPRESSION: 1. Mildly increased hepatic parenchymal echogenicity suggestive of steatosis. 2. 9 mm probable cyst within the pancreas. Recommend follow-up pre and post contrast-enhanced abdominal MRI in 1 year.   IMPRESSION: 1. Mild wall thickening in the mid stomach. Gastritis would be a consideration. 2. Small hiatal hernia. 3. Diffuse colonic diverticulosis without diverticulitis. 4. Aortic Atherosclerosis (ICD10-I70.0).  2017 virtual colonoscopy   IMPRESSION: Suspected 7 mm colonic polyp on a fold in the rectum, 8 mm from  the anal verge. Otherwise, no significant colonic polypoid lesion, mass, or stricture.   Extensive colonic diverticulosis, without evidence of diverticulitis.   Punctate nonobstructing right lower pole renal calculus. No ureteral or bladder calculi. No hydronephrosis. Otherwise unremarkable unenhanced CT abdomen/pelvis.  Blood work from 02/2023 hemoglobin 12.6 platelet 265 WBC 4.8 creatinine 1.15 alk phos 131 previously 173 normal AST ALT T. bili 0.5 TSH 2.5  Impression and Plan:   Melissa Ross is a 89  y.o. female with HTN , GERD , CAD who initially presented for evaluation of multiple complaints: persistent nausea/vomiting , constipation , abdominal discomfort , abdominal knot  and pancreatic cyst .  Patient had a CT abdomen showing gastric wall thickening and here to discuss options  #Abnormal CT : Gastric wall thickening #Early satiety   CT demonstrated Eccentric gastric wall thickening and hyperenhancement along the lesser curvature   I had an extensive discussion with the patient and Son in clinic today  that this gastric wall thickening could be gastritis , underdistention but given early satiety , advance age I am more concerned about gastric malignancy which I cannot exclude without upper endoscopy  After discussing risk, benefit, limitation of upper endoscopy and anesthesia patient would like to proceed with endoscopic evaluation; given persistent symptoms  If pyloric stenosis is encountered we will also perform dilation at the same time  I thoroughly discussed with the patient the procedure, including the risks involved. Patient understands what the procedure involves including the benefits and any risks. Patient understands alternatives to the proposed procedure. Risks including (but not limited to) bleeding, tearing of the lining (perforation), rupture of adjacent organs, problems with heart and lung function, infection, and medication reactions. A small percentage of  complications may require surgery, hospitalization, repeat endoscopic procedure, and/or transfusion.  Patient understood and agreed.    #Multiple Gi complaints : nausea and vomiting  #RLQ Nodule/lesion   Since last presentation patient underwent multiple studies including gastric emptying study demonstrating slight gastroparesis ( 80% emptying rather than 90%) , upper GI series without any overt lesion but demonstrates patient having pain whenever food passes by the pylorus and ultrasound demonstrating fatty liver disease and pancreatic cyst  Last upper endoscopy 2013 where patient presented with episodes of self resolving dysphagia  This could be uncontrolled GERD,partial gastric outlet obstruction and malignancy needs to be ruled out  Discussed risk-benefit indication and limitation of upper endoscopy.  I discussed with patient without upper endoscopy would not be able to rule out lesion such as malignancy.  Patient would like to forego any invasive procedures at this time given risk of anesthesia and procedure  PPI daily 30 minutes for symptomatic relief With Zofran  as needed  # Incomplete colonoscopy   There appears patient never had a complete colonoscopy as last colonoscopy was in 2017 by Dr. Golda which was not completed due to difficult tortuous colon.  Given advanced age after discussing risk-benefit limitation of colonoscopy patient have opted out to proceed with any colonoscopy at this time  #Constipation   Previously patient with severe constipation but with addition of MiraLAX  Metamucil is well-managed Ensure adequate fluid intake: Aim for 8 glasses of water  daily. Follow a high fiber diet: Include foods such as dates, prunes, pears, and kiwi. Take Miralax  twice a day for the first week, then reduce to once daily thereafter. Use Metamucil twice a day.  #Pancreatic cyst   Abdominal ultrasound demonstrated pancreatic cyst.  Discussed with patient that pancreatic cyst are  not necessarily considered benign and require further imaging. Although we discussed the utility of imaging and if this appears to be premalignant and malignant would require surgery ( [place]) .  Given patient preference and ACG guideline we will forego any surveillance at this time  Patients who are not medically fit for surgery should not undergo further evaluation of incidentally found pancreatic cysts, irrespective of cyst size (Strong recommendation, low quality of evidence)  Recent CT did not mention any pancreatic cyst  but shows atrophy   All questions were answered.      Keedan Sample Faizan Semiyah Newgent, MD Gastroenterology and Hepatology [facility]   This chart has been completed using [facility] Dictation software, and while attempts have been made to ensure accuracy , certain words and phrases may not be transcribed as intended

## 2024-02-20 NOTE — Patient Instructions (Signed)
 It was very nice to meet you today, as dicussed with will plan for the following :  1) take omeprazole  40mg  daily , 30 min before breakfast  2) Zofran  as needed for nausea  3) Miralax  daily to prevent constipation   4) Upper endoscopy

## 2024-02-27 NOTE — Telephone Encounter (Signed)
 Patient called in. She stated she is not taking blood thinner. Hasn't taken any medications since the beginning of year, Advised her the medications that the med list had and she stated she has only been  taking what Dr. Cinderella prescribed her at the office visit and the rest should have been taken off. Please advise Dr. Cinderella thanks

## 2024-02-28 NOTE — Telephone Encounter (Addendum)
 Spoke with pt. Scheduled for 8/26. Discussed instructions with her in detail over the phone since she would not receive in time in the mail. Confirmed with pt again she was not taking any blood thinners and she stated she was not.

## 2024-02-28 NOTE — Telephone Encounter (Signed)
 Spoke with pt. She stated she was very sick over the weekend and needed her procedure done ASAP. Nothing available for rm 3 until sept. Dr. Cinderella, can she be done in rm 1-2?

## 2024-02-28 NOTE — Telephone Encounter (Addendum)
 Spoke with pt. Moved procedure up to 8/21. Discussed her EGD instructions with her in detail again. She voiced understanding. Aware she will also get a pre-op phone call confirming arrival time.

## 2024-02-28 NOTE — Telephone Encounter (Signed)
 Note also sent to PCP for rec to update echo

## 2024-02-29 ENCOUNTER — Encounter (HOSPITAL_COMMUNITY): Payer: Self-pay

## 2024-02-29 ENCOUNTER — Encounter (HOSPITAL_COMMUNITY)
Admission: RE | Admit: 2024-02-29 | Discharge: 2024-02-29 | Disposition: A | Discharge status: Home / Self Care | Source: Ambulatory Visit | Attending: Gastroenterology | Admitting: Gastroenterology

## 2024-02-29 ENCOUNTER — Other Ambulatory Visit: Payer: Self-pay

## 2024-02-29 NOTE — Pre-Procedure Instructions (Signed)
 PAT visit appointment completed with patient's son via phone.

## 2024-03-01 ENCOUNTER — Ambulatory Visit (HOSPITAL_COMMUNITY): Admitting: Certified Registered"

## 2024-03-01 ENCOUNTER — Telehealth (INDEPENDENT_AMBULATORY_CARE_PROVIDER_SITE_OTHER): Payer: Self-pay | Admitting: Gastroenterology

## 2024-03-01 ENCOUNTER — Encounter (HOSPITAL_COMMUNITY)
Admission: RE | Disposition: A | Discharge status: Home / Self Care | Payer: Self-pay | Source: Home / Self Care | Attending: Gastroenterology

## 2024-03-01 ENCOUNTER — Other Ambulatory Visit: Payer: Self-pay

## 2024-03-01 ENCOUNTER — Ambulatory Visit (HOSPITAL_COMMUNITY)
Admission: RE | Admit: 2024-03-01 | Discharge: 2024-03-01 | Disposition: A | Discharge status: Home / Self Care | Attending: Gastroenterology | Admitting: Gastroenterology

## 2024-03-01 ENCOUNTER — Encounter (HOSPITAL_COMMUNITY): Payer: Self-pay | Admitting: Gastroenterology

## 2024-03-01 DIAGNOSIS — K259 Gastric ulcer, unspecified as acute or chronic, without hemorrhage or perforation: Secondary | ICD-10-CM | POA: Insufficient documentation

## 2024-03-01 DIAGNOSIS — K222 Esophageal obstruction: Secondary | ICD-10-CM | POA: Insufficient documentation

## 2024-03-01 DIAGNOSIS — I2510 Atherosclerotic heart disease of native coronary artery without angina pectoris: Secondary | ICD-10-CM | POA: Diagnosis not present

## 2024-03-01 DIAGNOSIS — K449 Diaphragmatic hernia without obstruction or gangrene: Secondary | ICD-10-CM

## 2024-03-01 DIAGNOSIS — K3189 Other diseases of stomach and duodenum: Secondary | ICD-10-CM | POA: Diagnosis not present

## 2024-03-01 DIAGNOSIS — R1112 Projectile vomiting: Secondary | ICD-10-CM

## 2024-03-01 DIAGNOSIS — Z8249 Family history of ischemic heart disease and other diseases of the circulatory system: Secondary | ICD-10-CM | POA: Insufficient documentation

## 2024-03-01 DIAGNOSIS — I10 Essential (primary) hypertension: Secondary | ICD-10-CM | POA: Diagnosis not present

## 2024-03-01 DIAGNOSIS — K862 Cyst of pancreas: Secondary | ICD-10-CM | POA: Diagnosis not present

## 2024-03-01 DIAGNOSIS — C163 Malignant neoplasm of pyloric antrum: Secondary | ICD-10-CM | POA: Diagnosis not present

## 2024-03-01 DIAGNOSIS — K209 Esophagitis, unspecified without bleeding: Secondary | ICD-10-CM | POA: Diagnosis not present

## 2024-03-01 DIAGNOSIS — J45909 Unspecified asthma, uncomplicated: Secondary | ICD-10-CM | POA: Insufficient documentation

## 2024-03-01 DIAGNOSIS — C169 Malignant neoplasm of stomach, unspecified: Secondary | ICD-10-CM | POA: Diagnosis not present

## 2024-03-01 DIAGNOSIS — K5900 Constipation, unspecified: Secondary | ICD-10-CM | POA: Insufficient documentation

## 2024-03-01 DIAGNOSIS — R1115 Cyclical vomiting syndrome unrelated to migraine: Secondary | ICD-10-CM | POA: Diagnosis present

## 2024-03-01 DIAGNOSIS — I252 Old myocardial infarction: Secondary | ICD-10-CM | POA: Diagnosis not present

## 2024-03-01 DIAGNOSIS — K31A0 Gastric intestinal metaplasia, unspecified: Secondary | ICD-10-CM | POA: Diagnosis not present

## 2024-03-01 DIAGNOSIS — K311 Adult hypertrophic pyloric stenosis: Secondary | ICD-10-CM

## 2024-03-01 DIAGNOSIS — K2100 Gastro-esophageal reflux disease with esophagitis, without bleeding: Secondary | ICD-10-CM | POA: Diagnosis not present

## 2024-03-01 DIAGNOSIS — K2970 Gastritis, unspecified, without bleeding: Secondary | ICD-10-CM | POA: Insufficient documentation

## 2024-03-01 DIAGNOSIS — K219 Gastro-esophageal reflux disease without esophagitis: Secondary | ICD-10-CM

## 2024-03-01 DIAGNOSIS — K31A11 Gastric intestinal metaplasia without dysplasia, involving the antrum: Secondary | ICD-10-CM | POA: Diagnosis not present

## 2024-03-01 DIAGNOSIS — Z79899 Other long term (current) drug therapy: Secondary | ICD-10-CM | POA: Insufficient documentation

## 2024-03-01 DIAGNOSIS — R1013 Epigastric pain: Secondary | ICD-10-CM | POA: Diagnosis present

## 2024-03-01 DIAGNOSIS — K253 Acute gastric ulcer without hemorrhage or perforation: Secondary | ICD-10-CM

## 2024-03-01 HISTORY — PX: ESOPHAGOGASTRODUODENOSCOPY: SHX5428

## 2024-03-01 SURGERY — EGD (ESOPHAGOGASTRODUODENOSCOPY)
Anesthesia: General

## 2024-03-01 MED ORDER — LACTATED RINGERS IV SOLN: INTRAVENOUS | Status: DC | PRN

## 2024-03-01 MED ORDER — ONDANSETRON 8 MG PO TBDP
8.0000 mg | ORAL_TABLET | Freq: Three times a day (TID) | ORAL | 0 refills | Status: DC | PRN
Start: 2024-03-01 — End: 2024-03-14

## 2024-03-01 MED ORDER — LIDOCAINE 2% (20 MG/ML) 5 ML SYRINGE
INTRAMUSCULAR | Status: DC | PRN
  Administered 2024-03-01: 40 mg via INTRAVENOUS

## 2024-03-01 MED ORDER — LACTATED RINGERS IV SOLN: INTRAVENOUS | Status: DC

## 2024-03-01 MED ORDER — PROPOFOL 10 MG/ML IV BOLUS
INTRAVENOUS | Status: DC | PRN
  Administered 2024-03-01: 100 ug/kg/min via INTRAVENOUS
  Administered 2024-03-01: 20 mg via INTRAVENOUS
  Administered 2024-03-01: 40 mg via INTRAVENOUS

## 2024-03-01 MED ORDER — PANTOPRAZOLE SODIUM 40 MG PO TBEC: 40.0000 mg | DELAYED_RELEASE_TABLET | Freq: Two times a day (BID) | ORAL | 1 refills | Status: DC

## 2024-03-01 MED ORDER — OMEPRAZOLE 40 MG PO CPDR: 40.0000 mg | DELAYED_RELEASE_CAPSULE | Freq: Two times a day (BID) | ORAL | 3 refills | Status: DC

## 2024-03-01 NOTE — Transfer of Care (Signed)
 Immediate Anesthesia Transfer of Care Note  Patient: Melissa Ross  Procedure(s) Performed: EGD (ESOPHAGOGASTRODUODENOSCOPY)  Patient Location: Short Stay  Anesthesia Type:General  Level of Consciousness: drowsy and patient cooperative  Airway & Oxygen  Therapy: Patient Spontanous Breathing  Post-op Assessment: Report given to RN and Post -op Vital signs reviewed and stable  Post vital signs: Reviewed and stable  Last Vitals:  Vitals Value Taken Time  BP 150/56 03/01/24 10:12  Temp 36.7 C 03/01/24 10:12  Pulse 84 03/01/24 10:12  Resp 15 03/01/24 10:12  SpO2 99 % 03/01/24 10:12    Last Pain:  Vitals:   03/01/24 1012  TempSrc: Oral  PainSc: 0-No pain         Complications: No notable events documented.

## 2024-03-01 NOTE — Telephone Encounter (Signed)
 Yes she will be now on pantoprazole  40mg  , twice daily

## 2024-03-01 NOTE — Telephone Encounter (Signed)
 Summer from [facility] left voicemail stating that they have received a script for omeprazole  and pantoprazole . Pt was on Omeprazole  and [facility] is needing to know if we are switching her to Pantoprazole . Please advise. Thank you.

## 2024-03-01 NOTE — Op Note (Signed)
 [facility] Patient Name: Melissa Ross Procedure Date: 03/01/2024 9:34 AM MRN: [id number] Date of Birth: 12/09/1930 Attending MD: Deatrice Dine , MD, [id number] CSN: [id number] Age: 89 Admit Type: Outpatient Procedure:                Upper GI endoscopy Indications:              Epigastric abdominal pain, Abnormal CT of the GI                            tract, Persistent vomiting Providers:                Deatrice Dine, MD, Crystal Page, [place] Wilson,                            Technician Referring MD:              Medicines:                Monitored Anesthesia Care Complications:            No immediate complications. Estimated Blood Loss:     Estimated blood loss was minimal. Procedure:                Pre-Anesthesia Assessment:                           - Prior to the procedure, a History and Physical                            was performed, and patient medications and                            allergies were reviewed. The patient's tolerance of                            previous anesthesia was also reviewed. The risks                            and benefits of the procedure and the sedation                            options and risks were discussed with the patient.                            All questions were answered, and informed consent                            was obtained. Prior Anticoagulants: The patient has                            taken no anticoagulant or antiplatelet agents                            except for aspirin . ASA Grade Assessment: III - A  patient with severe systemic disease. After                            reviewing the risks and benefits, the patient was                            deemed in satisfactory condition to undergo the                            procedure.                           After obtaining informed consent, the endoscope was                            passed under direct vision. Throughout the                             procedure, the patient's blood pressure, pulse, and                            oxygen  saturations were monitored continuously. The                            [id number] ([id number]) Upper was introduced through                            the mouth, and advanced to the second part of                            duodenum. The upper GI endoscopy was accomplished                            without difficulty. The patient tolerated the                            procedure well. Scope In: 9:55:08 AM Scope Out: 10:08:04 AM Total Procedure Duration: 0 hours 12 minutes 56 seconds  Findings:      LA Grade B (one or more mucosal breaks greater than 5 mm, not extending       between the tops of two mucosal folds) esophagitis with no bleeding was       found in the lower third of the esophagus.      A 4 cm hiatal hernia was present.      The gastroesophageal flap valve was visualized endoscopically and       classified as Hill Grade IV (no fold, wide open lumen, hiatal hernia       present).      One non-bleeding large cratered gastric ulcer with a clean ulcer base       (Forrest Class III) was found in the gastric antrum, r/o malignancy.       This was biopsied with a cold forceps for histology.      A deformity was found at the pylorus. A TTS dilator was passed through       the scope. Dilation with a 15  mm pyloric balloon dilator was performed.       The dilation site was examined following endoscope reinsertion and       showed moderate mucosal disruption, moderate improvement in luminal       narrowing and no perforation. Impression:               - LA Grade B reflux esophagitis with no bleeding.                           - 4 cm hiatal hernia.                           - Gastroesophageal flap valve classified as Hill                            Grade IV (no fold, wide open lumen, hiatal hernia                            present).                           - Non-bleeding  large gastric ulcer with a clean                            ulcer base (Forrest Class III); r/o maligancy .                            Biopsied.                           - Deformity in the pylorus. Dilated. Moderate Sedation:      Per Anesthesia Care Recommendation:           - Patient has a contact number available for                            emergencies. The signs and symptoms of potential                            delayed complications were discussed with the                            patient. Return to normal activities tomorrow.                            Written discharge instructions were provided to the                            patient.                           - Resume previous diet.                           - Continue present medications.                           -  Await pathology results.                           - Repeat upper endoscopy for surveillance based on                            pathology results.                           - Return to GI clinic as previously scheduled.                           -Avoid NSAIDs Procedure Code(s):        --- Professional ---                           [phone], Esophagogastroduodenoscopy, flexible,                            transoral; with dilation of gastric/duodenal                            stricture(s) (eg, balloon, bougie)                           43239, 59, Esophagogastroduodenoscopy, flexible,                            transoral; with biopsy, single or multiple Diagnosis Code(s):        --- Professional ---                           K21.00, Gastro-esophageal reflux disease with                            esophagitis, without bleeding                           K44.9, Diaphragmatic hernia without obstruction or                            gangrene                           K25.9, Gastric ulcer, unspecified as acute or                            chronic, without hemorrhage or perforation                            K31.89, Other diseases of stomach and duodenum                           R10.13, Epigastric pain                           R11.15, Cyclical vomiting syndrome unrelated to  migraine                           R93.3, Abnormal findings on diagnostic imaging of                            other parts of digestive tract CPT copyright 2022 American Medical Association. All rights reserved. The codes documented in this report are preliminary and upon coder review may  be revised to meet current compliance requirements. Deatrice Dine, MD Deatrice Dine, MD 03/01/2024 10:16:58 AM This report has been signed electronically. Number of Addenda: 0

## 2024-03-01 NOTE — Telephone Encounter (Signed)
 [facility] contacted and spoke with Nathanel. Advised that pt is to be on Pantoprazole  40 mg BID.

## 2024-03-01 NOTE — Anesthesia Preprocedure Evaluation (Signed)
 Anesthesia Evaluation  Patient identified by MRN, date of birth, ID band Patient awake    Reviewed: Allergy & Precautions, H&P , NPO status , Patient's Chart, lab work & pertinent test results, reviewed documented beta blocker date and time   Airway Mallampati: II  TM Distance: >3 FB Neck ROM: full    Dental no notable dental hx.    Pulmonary asthma    Pulmonary exam normal breath sounds clear to auscultation       Cardiovascular Exercise Tolerance: Good hypertension, + CAD and + Past MI   Rhythm:regular Rate:Normal     Neuro/Psych negative neurological ROS  negative psych ROS   GI/Hepatic Neg liver ROS,GERD  ,,  Endo/Other  negative endocrine ROS    Renal/GU negative Renal ROS  negative genitourinary   Musculoskeletal   Abdominal   Peds  Hematology negative hematology ROS (+)   Anesthesia Other Findings   Reproductive/Obstetrics negative OB ROS                              Anesthesia Physical Anesthesia Plan  ASA: 3  Anesthesia Plan: General   Post-op Pain Management:    Induction:   PONV Risk Score and Plan: Propofol  infusion  Airway Management Planned:   Additional Equipment:   Intra-op Plan:   Post-operative Plan:   Informed Consent: I have reviewed the patients History and Physical, chart, labs and discussed the procedure including the risks, benefits and alternatives for the proposed anesthesia with the patient or authorized representative who has indicated his/her understanding and acceptance.     Dental Advisory Given  Plan Discussed with: CRNA  Anesthesia Plan Comments:         Anesthesia Quick Evaluation

## 2024-03-01 NOTE — Interval H&P Note (Signed)
 History and Physical Interval Note:  03/01/2024 8:59 AM  Melissa Ross  has presented today for surgery, with the diagnosis of NAUSEA, VOMITING.  The various methods of treatment have been discussed with the patient and family. After consideration of risks, benefits and other options for treatment, the patient has consented to  Procedure(s) with comments: EGD (ESOPHAGOGASTRODUODENOSCOPY) (N/A) - 945AM, OK RM 1-2 as a surgical intervention.  The patient's history has been reviewed, patient examined, no change in status, stable for surgery.  I have reviewed the patient's chart and labs.  Questions were answered to the patient's satisfaction.     Melissa Ross

## 2024-03-01 NOTE — Anesthesia Procedure Notes (Signed)
 Date/Time: 03/01/2024 9:48 AM  Performed by: Para Jerelene CROME, CRNAOxygen Delivery Method: Nasal cannula

## 2024-03-01 NOTE — Discharge Instructions (Addendum)
  Discharge instructions Please read the instructions outlined below and refer to this sheet in the next few weeks. These discharge instructions provide you with general information on caring for yourself after you leave the hospital. Your doctor may also give you specific instructions. While your treatment has been planned according to the most current medical practices available, unavoidable complications occasionally occur. If you have any problems or questions after discharge, please call your doctor. ACTIVITY You may resume your regular activity but move at a slower pace for the next 24 hours.  Take frequent rest periods for the next 24 hours.  Walking will help expel (get rid of) the air and reduce the bloated feeling in your abdomen.  No driving for 24 hours (because of the anesthesia (medicine) used during the test).  You may shower.  Do not sign any important legal documents or operate any machinery for 24 hours (because of the anesthesia used during the test).  NUTRITION Drink plenty of fluids.  You may resume your normal diet.  Begin with a light meal and progress to your normal diet.  Avoid alcoholic beverages for 24 hours or as instructed by your caregiver.  MEDICATIONS You may resume your normal medications unless your caregiver tells you otherwise.  WHAT YOU CAN EXPECT TODAY You may experience abdominal discomfort such as a feeling of fullness or "gas" pains.  FOLLOW-UP Your doctor will discuss the results of your test with you.  SEEK IMMEDIATE MEDICAL ATTENTION IF ANY OF THE FOLLOWING OCCUR: Excessive nausea (feeling sick to your stomach) and/or vomiting.  Severe abdominal pain and distention (swelling).  Trouble swallowing.  Temperature over 101 F (37.8 C).  Rectal bleeding or vomiting of blood.   Pantoprazole  40mg   , twice daily  Avoid  using high dose aspirin  including Goody/BC powders, NSAIDs such as Aleve, ibuprofen, naproxen, Motrin, Voltaren or Advil (even the  topical ones)      I hope you have a great rest of your week!   Keniah Klemmer Faizan Artice Bergerson , M.D.. Gastroenterology and Hepatology [facility]

## 2024-03-02 ENCOUNTER — Encounter (HOSPITAL_COMMUNITY): Payer: Self-pay | Admitting: Gastroenterology

## 2024-03-02 ENCOUNTER — Ambulatory Visit (INDEPENDENT_AMBULATORY_CARE_PROVIDER_SITE_OTHER): Payer: Self-pay | Admitting: Gastroenterology

## 2024-03-02 ENCOUNTER — Telehealth (INDEPENDENT_AMBULATORY_CARE_PROVIDER_SITE_OTHER): Payer: Self-pay | Admitting: *Deleted

## 2024-03-02 LAB — SURGICAL PATHOLOGY

## 2024-03-02 NOTE — Telephone Encounter (Signed)
 Referral sent as Urgent, they will contact patient with apt

## 2024-03-02 NOTE — Telephone Encounter (Signed)
-----   Message from Melissa Ross sent at 03/02/2024 10:54 AM EDT ----- Regarding: referral Hi [name] ,  Can you please arrange a referral for this patient to oncology ( urgent)   Diagnosis: Gastric cancer   Thanks,  Muhammad Faizan Ahmed, MD Gastroenterology and Hepatology [facility]

## 2024-03-05 NOTE — Progress Notes (Signed)
 Referral sent, they will contact patient with apt

## 2024-03-06 ENCOUNTER — Telehealth (INDEPENDENT_AMBULATORY_CARE_PROVIDER_SITE_OTHER): Payer: Self-pay | Admitting: Gastroenterology

## 2024-03-06 NOTE — Telephone Encounter (Signed)
 Pt left voicemail stating that Jeralyn at [facility] wanted Dr.Ahmed to call him. Contacted [facility] and spoke with [place]. Jeralyn states that pt is having a hard time with stomach and recently diagnosed with stomach cancer. Pt PCP sends in a GI cocktail(Maalox and Phenergan  Combo) but doesn't send in a lot. Suspension does help patient and Jeralyn is wanting to know if we could send in the suspension. Cocktail includes 50 mL promethazine  syrup and 250 mL Maalox. Pete asked if we could send in 300 mL. A larger quantity would be most cost effective also for pt.  Contacted pt to let her know that I spoke with Jeralyn due to provider being at hospital doing procedure. Pt asked that we give refills on suspension if we send it in also.   Please advise. Thank you!!

## 2024-03-07 MED ORDER — PROMETHAZINE HCL 25 MG PO TABS: 25.0000 mg | ORAL_TABLET | Freq: Four times a day (QID) | ORAL | 0 refills | Status: DC | PRN

## 2024-03-07 MED ORDER — GI COCKTAIL ~~LOC~~: 30.0000 mL | Freq: Two times a day (BID) | ORAL | 0 refills | Status: DC | PRN

## 2024-03-07 NOTE — Addendum Note (Signed)
 Addended by: CINDERELLA DEATRICE SMILES on: 03/07/2024 08:36 AM   Modules accepted: Orders

## 2024-03-07 NOTE — Telephone Encounter (Signed)
 Pt son Juliane left voicemail and states that he is trying to take care of pt at home but she is sick, weak, and not enough breath to talk. Pt son wanted to know if Dr.Ahmed would admit pt to hospital until next Wednesday when she sees the cancer center. Please advise. Thank you!  [phone]

## 2024-03-07 NOTE — Telephone Encounter (Signed)
 Hi Tanya  Please let the patient son know that I do understand the situation . But I do not have admitting capability , that's up to the Emergency department and medicine doctors.  I do recommend that if patient is feeling sick, weak especially not able to breath to come to the ED and get assessed for further management

## 2024-03-07 NOTE — Telephone Encounter (Signed)
 Phenergan  can cause confusion and dizziness in elderly. Given her significant symptoms I have prescribed it . Please let patient know to be vigilant about any drowsiness, sedation, confusion, disorientation, dizziness, blurred vision

## 2024-03-07 NOTE — Telephone Encounter (Signed)
 Pt son contacted. Pt son states that pt has worsened since seeing the doctor. Advised pt son that if pt has worsened, they would need to head to ER. Son states that he spoke with his mom before 8:00 this morning and pt stated she was ready to go. Pt son states that it may better to call the EMS to pick pt up. Advised son that would be a good idea. Advised pt that pt could go to [facility] since our providers are there. Son verbalized understanding. Also advised son about phenergan  and informed him that we have sent the tablets and the suspension in to pharmacy. Son states that he may wait a day or two to see how pt does.

## 2024-03-08 NOTE — Anesthesia Postprocedure Evaluation (Signed)
 Anesthesia Post Note  Patient: Melissa Ross  Procedure(s) Performed: EGD (ESOPHAGOGASTRODUODENOSCOPY)  Patient location during evaluation: Phase II Anesthesia Type: General Level of consciousness: awake Pain management: pain level controlled Vital Signs Assessment: post-procedure vital signs reviewed and stable Respiratory status: spontaneous breathing and respiratory function stable Cardiovascular status: blood pressure returned to baseline and stable Postop Assessment: no headache and no apparent nausea or vomiting Anesthetic complications: no Comments: Late entry   No notable events documented.   Last Vitals:  Vitals:   03/01/24 0836 03/01/24 1012  BP: (!) 190/73 (!) 150/56  Pulse: 83 84  Resp: (!) 23 15  Temp: 37.2 C 36.7 C  SpO2: 100% 99%    Last Pain:  Vitals:   03/01/24 1012  TempSrc: Oral  PainSc: 0-No pain                 Yvonna JINNY Bosworth

## 2024-03-13 NOTE — Progress Notes (Unsigned)
 Hematology-Oncology Clinic Note  Melissa Leta NOVAK, MD   Reason for Referral: Gastric adenocarcinoma  Oncology History: I have reviewed her chart and materials related to her cancer extensively and collaborated history with the patient. Summary of oncologic history is as follows:  Diagnosis: Gastric adenocarcinoma  -08/26/2021: CT [facility]: Mild wall thickening in the mid stomach. Gastritis would be a consideration. -08/16/2023: [facility]: Eccentric gastric wall thickening and hyperenhancement along the lesser curvature, mass or inflammation could give this appearance. -03/01/2024: EGD:LA Grade B reflux esophagitis with no bleeding. Gastroesophageal flap valve classified as Hill Grade IV ( no fold, wide open lumen, hiatal hernia present) . Non- bleeding large gastric ulcer with a clean ulcer base ( Forrest Class III)  -03/01/2024: Stomach ulcer biopsy:   -Moderate to poorly differentiated invasive adenocarcinoma.    History of Presenting Illness: Melissa Ross 89 y.o. female is referred by Dr. Cinderella  for newly diagnosed gastric adenocarcinoma.  Patient was accompanied by her son and 2 daughter-in-law's today.  Patient was sitting in the wheelchair and laying her head over the examination table.She experiences significant nausea and vomiting, with episodes occurring shortly after eating or drinking. Even water  and nutritional supplements like Ensure are often vomited within ten minutes of ingestion. She describes a sensation of fullness and has difficulty keeping food down, although there is no trouble swallowing.  Diarrhea is present, and these symptoms have persisted, significantly impacting her ability to maintain nutrition and hydration.No pain or bleeding.  Substantial weight loss has occurred over the past few years, estimated at around 100 pounds. Her family notes that she has been sick for a long time, with symptoms worsening after meals.   She has not smoked or consumed alcohol. There  is a family history of various cancers, including prostate, lung, and liver cancer among her relatives.  She lives with her husband who is 64 years old and has dementia.  Family lives very close by.   Medical History: Past Medical History:  Diagnosis Date   Arthritis    Asthma    GERD (gastroesophageal reflux disease)    Hypertension    Myocardial infarction (HCC) 03/2013   Respiratory failure (HCC)    August 2016, requiring intubation   Ventral hernia     Surgical history: Past Surgical History:  Procedure Laterality Date   APPENDECTOMY     CATARACT EXTRACTION W/PHACO  11/09/2011   Procedure: CATARACT EXTRACTION PHACO AND INTRAOCULAR LENS PLACEMENT (IOC);  Surgeon: Oneil T. Roz, MD;  Location: [facility];  Service: Ophthalmology;  Laterality: Right;  CDE 10.01   CHOLECYSTECTOMY     COLONOSCOPY N/A 10/30/2015   Procedure: COLONOSCOPY;  Surgeon: Claudis RAYMOND Rivet, MD;  Location: [facility];  Service: Endoscopy;  Laterality: N/A;  2:20 - moved to 4/20 @ 1:25   ESOPHAGOGASTRODUODENOSCOPY N/A 03/01/2024   Procedure: EGD (ESOPHAGOGASTRODUODENOSCOPY);  Surgeon: Cinderella Deatrice FALCON, MD;  Location: [facility];  Service: Endoscopy;  Laterality: N/A;  945AM, OK RM 1-2   ESOPHAGOGASTRODUODENOSCOPY (EGD) WITH ESOPHAGEAL DILATION  05/11/2012   Procedure: ESOPHAGOGASTRODUODENOSCOPY (EGD) WITH ESOPHAGEAL DILATION;  Surgeon: Claudis RAYMOND Rivet, MD;  Location: [facility];  Service: Endoscopy;  Laterality: N/A;   EYE SURGERY     left KPE   INSERTION OF MESH  07/17/2012   Procedure: INSERTION OF MESH;  Surgeon: Camellia CHRISTELLA Blush, MD,FACS;  Location: [facility] OR;  Service: General;  Laterality: N/A;   LEFT HEART CATHETERIZATION WITH CORONARY ANGIOGRAM N/A 03/19/2013   Procedure: LEFT HEART  CATHETERIZATION WITH CORONARY ANGIOGRAM;  Surgeon: Rober LOISE Chroman, MD;  Location: [facility] CATH LAB;  Service: Cardiovascular;  Laterality: N/A;   TOE SURGERY     right foot   TONSILLECTOMY     TUBAL LIGATION     VENTRAL HERNIA  REPAIR  07/17/2012   Procedure: LAPAROSCOPIC VENTRAL HERNIA;  Surgeon: Camellia CHRISTELLA Blush, MD,FACS;  Location: [facility] OR;  Service: General;  Laterality: N/A;     Allergies:  is allergic to bacitracin, neosporin [neomycin-bacitracin zn-polymyx], polymyxin b, and penicillins.  Medications:  Current Outpatient Medications  Medication Sig Dispense Refill   pantoprazole  (PROTONIX ) 40 MG tablet Take 1 tablet (40 mg total) by mouth 2 (two) times daily. 30 tablet 1   promethazine  (PHENERGAN ) 25 MG tablet Take 1 tablet (25 mg total) by mouth every 6 (six) hours as needed for nausea or vomiting. 30 tablet 0   Alum & Mag Hydroxide-Simeth (GI COCKTAIL) SUSP suspension Take 30 mLs by mouth 2 (two) times daily as needed for indigestion. Shake well. (Patient not taking: Reported on 03/14/2024) 300 mL 0   amLODipine (NORVASC) 5 MG tablet Take by mouth. (Patient not taking: Reported on 03/14/2024)     aspirin  81 MG chewable tablet Chew 1 tablet (81 mg total) by mouth daily. (Patient not taking: Reported on 03/14/2024) 30 tablet 3   atorvastatin  (LIPITOR) 10 MG tablet Take 5 mg by mouth daily. (Patient not taking: Reported on 03/14/2024)  2   Cholecalciferol (VITAMIN D3 GUMMIES ADULT PO) Take by mouth. (Patient not taking: Reported on 03/14/2024)     clopidogrel  (PLAVIX ) 75 MG tablet Take 75 mg by mouth daily. (Patient not taking: Reported on 03/14/2024)     cyanocobalamin 100 MCG tablet Take 100 mcg by mouth daily. (Patient not taking: Reported on 03/14/2024)     lisinopril  (ZESTRIL ) 5 MG tablet Take 1 tablet (5 mg total) by mouth daily. (Patient not taking: Reported on 03/14/2024) 20 tablet 0   nitroGLYCERIN  (NITROSTAT ) 0.4 MG SL tablet Place 1 tablet (0.4 mg total) under the tongue every 5 (five) minutes x 3 doses as needed for chest pain. (Patient not taking: Reported on 03/14/2024) 25 tablet 12   ondansetron  (ZOFRAN -ODT) 8 MG disintegrating tablet Take 1 tablet (8 mg total) by mouth every 8 (eight) hours as needed for nausea or  vomiting. 30 tablet 3   OVER THE COUNTER MEDICATION nuropathy (Patient not taking: Reported on 03/14/2024)     polyethylene glycol powder (GLYCOLAX /MIRALAX ) 17 GM/SCOOP powder Take 8.5 g by mouth daily. (Patient not taking: Reported on 03/14/2024)     zolpidem  (AMBIEN ) 5 MG tablet Take 1 tablet (5 mg total) by mouth at bedtime as needed for sleep. 10 tablet 0   No current facility-administered medications for this visit.    Review of Systems: Constitutional: Denies fevers, chills or abnormal night sweats Eyes: Denies blurriness of vision, double vision or watery eyes Ears, nose, mouth, throat, and face: Denies mucositis or sore throat Respiratory: Denies cough, dyspnea or wheezes Cardiovascular: Denies palpitation, chest discomfort or lower extremity swelling Gastrointestinal:  Denies nausea, heartburn or change in bowel habits Skin: Denies abnormal skin rashes Lymphatics: Denies new lymphadenopathy or easy bruising Neurological:Denies numbness, tingling or new weaknesses Behavioral/Psych: Mood is stable, no new changes  All other systems were reviewed with the patient and are negative.  Physical Examination: ECOG PERFORMANCE STATUS: 3 - Symptomatic, >50% confined to bed  Vitals:   03/14/24 1058  BP: 120/76  Pulse: (!) 104  Resp: 20  Temp: ROLLEN)  96.7 F (35.9 C)  SpO2: 96%   Filed Weights   03/14/24 1058  Weight: 140 lb 6.9 oz (63.7 kg)    GENERAL: Frail appearing SKIN: Very dry skin  LYMPH:  no palpable lymphadenopathy in the cervical, axillary or inguinal LUNGS: clear to auscultation and percussion with normal breathing effort HEART: regular rate & rhythm and no murmurs and no lower extremity edema ABDOMEN:abdomen soft, non-tender and normal bowel sounds Musculoskeletal:no cyanosis of digits and no clubbing  PSYCH: alert & oriented x 3 with fluent speech NEURO: no focal motor/sensory deficits   Laboratory Data: I have reviewed the data as listed Lab Results  Component  Value Date   WBC 5.6 03/14/2024   HGB 16.1 (H) 03/14/2024   HCT 48.9 (H) 03/14/2024   MCV 93.3 03/14/2024   PLT 316 03/14/2024   Recent Labs    08/16/23 1644 03/14/24 1143  NA  --  136  K  --  4.3  CL  --  101  CO2  --  23  GLUCOSE  --  130*  BUN  --  18  CREATININE 1.30* 1.20*  CALCIUM   --  9.6  GFRNONAA  --  43*  PROT  --  6.4*  ALBUMIN  --  3.6  AST  --  18  ALT  --  12  ALKPHOS  --  127*  BILITOT  --  1.0    Latest Reference Range & Units 03/14/24 11:42  Iron 28 - 170 ug/dL 52  UIBC ug/dL 733  TIBC 749 - 549 ug/dL 681  Saturation Ratios 10.4 - 31.8 % 16  Ferritin 11 - 307 ng/mL 24  Folate >5.9 ng/mL 15.8  Vitamin B12 180 - 914 pg/mL 420   Radiographic Studies: I have personally reviewed the radiological images as listed and agreed with the findings in the report.  CT ABDOMEN PELVIS W CONTRAST CLINICAL DATA:  Inguinal region pain. History of ventral hernia repair  EXAM: CT ABDOMEN AND PELVIS WITH CONTRAST  TECHNIQUE: Multidetector CT imaging of the abdomen and pelvis was performed using the standard protocol following bolus administration of intravenous contrast.  RADIATION DOSE REDUCTION: This exam was performed according to the departmental dose-optimization program which includes automated exposure control, adjustment of the mA and/or kV according to patient size and/or use of iterative reconstruction technique.  CONTRAST:  80mL OMNIPAQUE  IOHEXOL  300 MG/ML  SOLN  COMPARISON:  08/26/2021  FINDINGS: Lower chest: Coronary and aortic atherosclerosis. Bands of scarring in the lower lungs which are stable.  Hepatobiliary: No focal liver abnormality.Cholecystectomy. No biliary dilatation.  Pancreas: Atrophy similar to prior.  Spleen: Unremarkable.  Adrenals/Urinary Tract: Negative adrenals. No hydronephrosis or stone. Bilateral renal hilar cysts. Symmetric renal cortical lobulation attributed to scarring. Physiologic distension of the bladder  without focal or convincing wall thickening given degree of distension.  Stomach/Bowel: Widespread colonic diverticulosis. Gastric body thickening especially along the lesser curvature where there is accentuated mucosal enhancement. No extraluminal mass or inflammation seen.  Vascular/Lymphatic: Heavily calcified aorta with ostial narrowing of the visceral branches. No mass or adenopathy.  Reproductive:Calcified intramural fibroid towards the right measuring 16 mm.  Other: No ascites or pneumoperitoneum. Fatty enlargement of the bilateral upper groin attributed to shallow hernia. Midline hernia repair using mesh.  Musculoskeletal: Cortical thickening and trabecular coarsening involving the right hemipelvis consistent with Paget's. No acute or destructive change. Generalized degenerative facet spurring.  IMPRESSION: 1. Eccentric gastric wall thickening and hyperenhancement along the lesser curvature, mass or inflammation could give  this appearance. 2. Fatty enlargement of the bilateral upper groin compatible with fatty hernias. Stable appearance of the treated midline hernia without recurrence. 3. Advanced atherosclerosis, colonic diverticulosis, and pelvic Paget's.  Electronically Signed   By: Dorn Roulette M.D.   On: 08/31/2023 19:23    ASSESSMENT & PLAN:  Patient is a 89 y.o. female presenting for newly diagnosed gastric adenocarcinoma  Assessment & Plan Malignant neoplasm of stomach, unspecified location (HCC) Newly diagnosed gastric adenocarcinoma diagnosed via biopsy on EGD Symptoms include weight loss, nausea, vomiting, and difficulty eating  - We discussed diagnosis in detail and that we do not have a staging for the disease as we do not have imaging of her chest.  Patient expressed her interest not to get chemotherapy or radiation.  I do not believe surgery is an option for her due to her age and comorbidities. - The patient would like to get a PET scan done to  assess for the extent of the disease.  Will order a PET scan today. - Discussed various treatment options including palliative radiation or hospice for symptom management and quality of life. - Patient is leaning more towards not getting further treatment for cancer but would like to know the extent of the disease. - We might consider PEG tube if nutritional intake remains inadequate. - Will obtain baseline labs today  Return to clinic after PET scan to discuss results and further management  Projectile vomiting with nausea Chronic nausea and vomiting likely due to stomach cancer. Difficulty with food and fluid intake causing dehydration and weight loss.  - Will administer Zofran  injection in clinic. - Prescribe sublingual Zofran  for home use. - Prescribe Phenergan  for home use, alternate with Zofran  every four hours. - Administer IV fluids in clinic for dehydration. Erythrocytosis Likely secondary to dehydration.  - Will administer IV fluids in infusion clinic today.    Orders Placed This Encounter  Procedures   NM PET Image Initial (PI) Skull Base To Thigh    Standing Status:   Future    Expected Date:   03/21/2024    Expiration Date:   03/14/2025    If indicated for the ordered procedure, I authorize the administration of a radiopharmaceutical per Radiology protocol:   Yes    Preferred imaging location?:   [facility]    Release to patient:   Immediate   CBC with Differential    Standing Status:   Future    Number of Occurrences:   1    Expected Date:   03/14/2024    Expiration Date:   06/12/2024   Comprehensive metabolic panel    Standing Status:   Future    Number of Occurrences:   1    Expected Date:   03/14/2024    Expiration Date:   06/12/2024   Magnesium     Standing Status:   Future    Number of Occurrences:   1    Expected Date:   03/14/2024    Expiration Date:   06/12/2024   Iron and TIBC ([facility] ONLY)    Standing Status:   Future    Number of  Occurrences:   1    Expected Date:   03/14/2024    Expiration Date:   06/12/2024   Ferritin    Standing Status:   Future    Number of Occurrences:   1    Expected Date:   03/14/2024    Expiration Date:   06/12/2024   Vitamin B12  Standing Status:   Future    Number of Occurrences:   1    Expected Date:   03/14/2024    Expiration Date:   06/12/2024   Folate    Standing Status:   Future    Number of Occurrences:   1    Expected Date:   03/14/2024    Expiration Date:   06/12/2024    The total time spent in the appointment was 60 minutes encounter with patients including review of chart and various tests results, discussions about plan of care and coordination of care plan   All questions were answered. The patient knows to call the clinic with any problems, questions or concerns. No barriers to learning was detected.  Mickiel Dry, MD 9/3/20254:22 PM

## 2024-03-14 ENCOUNTER — Inpatient Hospital Stay

## 2024-03-14 ENCOUNTER — Inpatient Hospital Stay: Attending: Oncology | Admitting: Oncology

## 2024-03-14 ENCOUNTER — Other Ambulatory Visit: Payer: Self-pay

## 2024-03-14 VITALS — BP 120/76 | HR 104 | Temp 96.7°F | Resp 20 | Wt 140.4 lb

## 2024-03-14 DIAGNOSIS — Z6823 Body mass index (BMI) 23.0-23.9, adult: Secondary | ICD-10-CM | POA: Diagnosis not present

## 2024-03-14 DIAGNOSIS — Z808 Family history of malignant neoplasm of other organs or systems: Secondary | ICD-10-CM | POA: Diagnosis not present

## 2024-03-14 DIAGNOSIS — Z9049 Acquired absence of other specified parts of digestive tract: Secondary | ICD-10-CM | POA: Diagnosis not present

## 2024-03-14 DIAGNOSIS — J45909 Unspecified asthma, uncomplicated: Secondary | ICD-10-CM | POA: Diagnosis not present

## 2024-03-14 DIAGNOSIS — Z79899 Other long term (current) drug therapy: Secondary | ICD-10-CM | POA: Insufficient documentation

## 2024-03-14 DIAGNOSIS — D751 Secondary polycythemia: Secondary | ICD-10-CM | POA: Diagnosis not present

## 2024-03-14 DIAGNOSIS — Z8249 Family history of ischemic heart disease and other diseases of the circulatory system: Secondary | ICD-10-CM | POA: Diagnosis not present

## 2024-03-14 DIAGNOSIS — R1013 Epigastric pain: Secondary | ICD-10-CM | POA: Diagnosis not present

## 2024-03-14 DIAGNOSIS — R634 Abnormal weight loss: Secondary | ICD-10-CM | POA: Diagnosis not present

## 2024-03-14 DIAGNOSIS — Z7902 Long term (current) use of antithrombotics/antiplatelets: Secondary | ICD-10-CM | POA: Diagnosis not present

## 2024-03-14 DIAGNOSIS — Z801 Family history of malignant neoplasm of trachea, bronchus and lung: Secondary | ICD-10-CM | POA: Insufficient documentation

## 2024-03-14 DIAGNOSIS — I2510 Atherosclerotic heart disease of native coronary artery without angina pectoris: Secondary | ICD-10-CM | POA: Diagnosis not present

## 2024-03-14 DIAGNOSIS — E860 Dehydration: Secondary | ICD-10-CM | POA: Diagnosis not present

## 2024-03-14 DIAGNOSIS — Z515 Encounter for palliative care: Secondary | ICD-10-CM | POA: Diagnosis not present

## 2024-03-14 DIAGNOSIS — Z823 Family history of stroke: Secondary | ICD-10-CM | POA: Diagnosis not present

## 2024-03-14 DIAGNOSIS — Z8042 Family history of malignant neoplasm of prostate: Secondary | ICD-10-CM | POA: Insufficient documentation

## 2024-03-14 DIAGNOSIS — R1112 Projectile vomiting: Secondary | ICD-10-CM | POA: Diagnosis not present

## 2024-03-14 DIAGNOSIS — I10 Essential (primary) hypertension: Secondary | ICD-10-CM | POA: Diagnosis not present

## 2024-03-14 DIAGNOSIS — Z881 Allergy status to other antibiotic agents status: Secondary | ICD-10-CM | POA: Diagnosis not present

## 2024-03-14 DIAGNOSIS — C169 Malignant neoplasm of stomach, unspecified: Secondary | ICD-10-CM

## 2024-03-14 DIAGNOSIS — K429 Umbilical hernia without obstruction or gangrene: Secondary | ICD-10-CM | POA: Diagnosis not present

## 2024-03-14 DIAGNOSIS — M1990 Unspecified osteoarthritis, unspecified site: Secondary | ICD-10-CM | POA: Diagnosis not present

## 2024-03-14 DIAGNOSIS — K449 Diaphragmatic hernia without obstruction or gangrene: Secondary | ICD-10-CM | POA: Diagnosis not present

## 2024-03-14 DIAGNOSIS — E785 Hyperlipidemia, unspecified: Secondary | ICD-10-CM | POA: Diagnosis not present

## 2024-03-14 DIAGNOSIS — E8809 Other disorders of plasma-protein metabolism, not elsewhere classified: Secondary | ICD-10-CM | POA: Diagnosis not present

## 2024-03-14 DIAGNOSIS — E441 Mild protein-calorie malnutrition: Secondary | ICD-10-CM | POA: Diagnosis not present

## 2024-03-14 DIAGNOSIS — G893 Neoplasm related pain (acute) (chronic): Secondary | ICD-10-CM | POA: Diagnosis not present

## 2024-03-14 DIAGNOSIS — K5730 Diverticulosis of large intestine without perforation or abscess without bleeding: Secondary | ICD-10-CM | POA: Diagnosis not present

## 2024-03-14 DIAGNOSIS — Z66 Do not resuscitate: Secondary | ICD-10-CM | POA: Diagnosis not present

## 2024-03-14 DIAGNOSIS — R935 Abnormal findings on diagnostic imaging of other abdominal regions, including retroperitoneum: Secondary | ICD-10-CM

## 2024-03-14 DIAGNOSIS — Z880 Allergy status to penicillin: Secondary | ICD-10-CM | POA: Diagnosis not present

## 2024-03-14 DIAGNOSIS — K219 Gastro-esophageal reflux disease without esophagitis: Secondary | ICD-10-CM

## 2024-03-14 DIAGNOSIS — R112 Nausea with vomiting, unspecified: Secondary | ICD-10-CM | POA: Diagnosis not present

## 2024-03-14 DIAGNOSIS — I252 Old myocardial infarction: Secondary | ICD-10-CM | POA: Diagnosis not present

## 2024-03-14 DIAGNOSIS — R197 Diarrhea, unspecified: Secondary | ICD-10-CM | POA: Insufficient documentation

## 2024-03-14 LAB — CBC WITH DIFFERENTIAL/PLATELET
Abs Immature Granulocytes: 0.01 K/uL (ref 0.00–0.07)
Basophils Absolute: 0 K/uL (ref 0.0–0.1)
Basophils Relative: 1 %
Eosinophils Absolute: 0.1 K/uL (ref 0.0–0.5)
Eosinophils Relative: 2 %
HCT: 48.9 % — ABNORMAL HIGH (ref 36.0–46.0)
Hemoglobin: 16.1 g/dL — ABNORMAL HIGH (ref 12.0–15.0)
Immature Granulocytes: 0 %
Lymphocytes Relative: 35 %
Lymphs Abs: 2 K/uL (ref 0.7–4.0)
MCH: 30.7 pg (ref 26.0–34.0)
MCHC: 32.9 g/dL (ref 30.0–36.0)
MCV: 93.3 fL (ref 80.0–100.0)
Monocytes Absolute: 0.5 K/uL (ref 0.1–1.0)
Monocytes Relative: 9 %
Neutro Abs: 3 K/uL (ref 1.7–7.7)
Neutrophils Relative %: 53 %
Platelets: 316 K/uL (ref 150–400)
RBC: 5.24 MIL/uL — ABNORMAL HIGH (ref 3.87–5.11)
RDW: 13.2 % (ref 11.5–15.5)
WBC: 5.6 K/uL (ref 4.0–10.5)
nRBC: 0 % (ref 0.0–0.2)

## 2024-03-14 LAB — COMPREHENSIVE METABOLIC PANEL WITH GFR
ALT: 12 U/L (ref 0–44)
AST: 18 U/L (ref 15–41)
Albumin: 3.6 g/dL (ref 3.5–5.0)
Alkaline Phosphatase: 127 U/L — ABNORMAL HIGH (ref 38–126)
Anion gap: 12 (ref 5–15)
BUN: 18 mg/dL (ref 8–23)
CO2: 23 mmol/L (ref 22–32)
Calcium: 9.6 mg/dL (ref 8.9–10.3)
Chloride: 101 mmol/L (ref 98–111)
Creatinine, Ser: 1.2 mg/dL — ABNORMAL HIGH (ref 0.44–1.00)
GFR, Estimated: 43 mL/min — ABNORMAL LOW (ref >60)
Glucose, Bld: 130 mg/dL — ABNORMAL HIGH (ref 70–99)
Potassium: 4.3 mmol/L (ref 3.5–5.1)
Sodium: 136 mmol/L (ref 135–145)
Total Bilirubin: 1 mg/dL (ref 0.0–1.2)
Total Protein: 6.4 g/dL — ABNORMAL LOW (ref 6.5–8.1)

## 2024-03-14 LAB — FERRITIN: Ferritin: 24 ng/mL (ref 11–307)

## 2024-03-14 LAB — MAGNESIUM: Magnesium: 1.9 mg/dL (ref 1.7–2.4)

## 2024-03-14 LAB — IRON AND TIBC
Iron: 52 ug/dL (ref 28–170)
Saturation Ratios: 16 % (ref 10.4–31.8)
TIBC: 318 ug/dL (ref 250–450)
UIBC: 266 ug/dL

## 2024-03-14 LAB — FOLATE: Folate: 15.8 ng/mL (ref >5.9)

## 2024-03-14 LAB — VITAMIN B12: Vitamin B-12: 420 pg/mL (ref 180–914)

## 2024-03-14 MED ORDER — MAGNESIUM SULFATE 2 GM/50ML IV SOLN
2.0000 g | Freq: Once | INTRAVENOUS | Status: AC
  Administered 2024-03-14: 2 g via INTRAVENOUS
  Filled 2024-03-14: qty 50

## 2024-03-14 MED ORDER — ONDANSETRON HCL 4 MG/2ML IJ SOLN
8.0000 mg | Freq: Once | INTRAMUSCULAR | Status: AC
  Administered 2024-03-14: 8 mg via INTRAVENOUS
  Filled 2024-03-14: qty 4

## 2024-03-14 MED ORDER — POTASSIUM CHLORIDE IN NACL 20-0.9 MEQ/L-% IV SOLN
Freq: Once | INTRAVENOUS | Status: AC
  Filled 2024-03-14: qty 1000

## 2024-03-14 MED ORDER — ONDANSETRON 8 MG PO TBDP: 8.0000 mg | ORAL_TABLET | Freq: Three times a day (TID) | ORAL | 3 refills | Status: DC | PRN

## 2024-03-14 MED ORDER — ZOLPIDEM TARTRATE 5 MG PO TABS: 5.0000 mg | ORAL_TABLET | Freq: Every evening | ORAL | 0 refills | Status: DC | PRN

## 2024-03-14 NOTE — Progress Notes (Signed)
 Patient presents today for house IVF.  Patient complains of nausea, vomiting, diarrhea, and weakness.  Vital signs are stable.  IV placed in L arm.  IV flushed well with good blood return noted.  We will proceed with infusion per provider orders.    Patient tolerated infusion well with no complaints voiced.  Patient left via wheelchair with son in stable condition.  Vital signs stable at discharge.  Follow up as scheduled.

## 2024-03-14 NOTE — Progress Notes (Signed)
   03/14/24 1200  Spiritual Encounters  Type of Visit Attempt (pt unavailable)  Care provided to: Patient   Pt referred to me by RN navigator as one who could benefit from Spiritual Care Support. I was told Pt was in the infusion clinic receiving fluids and anti-nausea medication.  Pt actually check-out and left.  Chaplain will plan to connect with Pt as next appointment.  Maude Roll, MDiv  Chaplain, [email or web address] [phone]

## 2024-03-14 NOTE — Assessment & Plan Note (Signed)
 Likely secondary to dehydration.  - Will administer IV fluids in infusion clinic today.

## 2024-03-14 NOTE — Patient Instructions (Addendum)
 [facility]  Discharge Instructions  You were seen and examined today by Dr. Davonna. Dr. Davonna is a medical oncologist, meaning that she specializes in the treatment of cancer diagnoses. Dr. Davonna discussed your past medical history, family history of cancers, and the events that led to you being here today.  You were referred to Dr. Davonna due to a new diagnosis of gastric (stomach) cancer. This has been confirmed by biopsy but without a recent CT scan, we are unable to know the current stage of the cancer.  Dr. Davonna has recommended a PET scan to identify the stage of the cancer.  Dr. Davonna has recommended fluids and antinausea medicine in the clinic today.  Dr. Davonna will send in sublingual zofran  so that you can alternate that with the phenergan  at home.  Follow-up after the PET scan.  Thank you for choosing [facility] to provide your oncology and hematology care.   To afford each patient quality time with our provider, please arrive at least 15 minutes before your scheduled appointment time. You may need to reschedule your appointment if you arrive late (10 or more minutes). Arriving late affects you and other patients whose appointments are after yours.  Also, if you miss three or more appointments without notifying the office, you may be dismissed from the clinic at the provider's discretion.    Again, thank you for choosing [facility].  Our hope is that these requests will decrease the amount of time that you wait before being seen by our physicians.   If you have a lab appointment with the Cancer Center - please note that after April 8th, all labs will be drawn in the cancer center.  You do not have to check in or register with the main entrance as you have in the past but will complete your check-in at the cancer center.            _____________________________________________________________  Should  you have questions after your visit to [facility], please contact our office at [phone] and follow the prompts.  Our office hours are 8:00 a.m. to 4:30 p.m. Monday - Thursday and 8:00 a.m. to 2:30 p.m. Friday.  Please note that voicemails left after 4:00 p.m. may not be returned until the following business day.  We are closed weekends and all major holidays.  You do have access to a nurse 24-7, just call the main number to the clinic [phone] and do not press any options, hold on the line and a nurse will answer the phone.    For prescription refill requests, have your pharmacy contact our office and allow 72 hours.    Masks are no longer required in the cancer centers. If you would like for your care team to wear a mask while they are taking care of you, please let them know. You may have one support person who is at least 88 years old accompany you for your appointments.

## 2024-03-14 NOTE — Patient Instructions (Signed)
 [facility] - A DEPT OF [facility]  Discharge Instructions: Thank you for choosing [facility] to provide your oncology and hematology care.  If you have a lab appointment with the Cancer Center - please note that after April 8th, 2024, all labs will be drawn in the cancer center.  You do not have to check in or register with the main entrance as you have in the past but will complete your check-in in the cancer center.  Wear comfortable clothing and clothing appropriate for easy access to any Portacath or PICC line.   We strive to give you quality time with your provider. You may need to reschedule your appointment if you arrive late (15 or more minutes).  Arriving late affects you and other patients whose appointments are after yours.  Also, if you miss three or more appointments without notifying the office, you may be dismissed from the clinic at the provider's discretion.      For prescription refill requests, have your pharmacy contact our office and allow 72 hours for refills to be completed.    Today you received the following chemotherapy and/or immunotherapy agents magnesium  sulfate.  Magnesium  Sulfate Injection What is this medication? MAGNESIUM  SULFATE (mag NEE zee um SUL fate) prevents and treats low levels of magnesium  in your body. It may also be used to prevent and treat seizures during pregnancy in people with high blood pressure disorders, such as preeclampsia or eclampsia. Magnesium  plays an important role in maintaining the health of your muscles and nervous system. This medicine may be used for other purposes; ask your health care provider or pharmacist if you have questions. What should I tell my care team before I take this medication? They need to know if you have any of these conditions: Heart disease History of irregular heart beat Kidney disease An unusual or allergic reaction to magnesium  sulfate, medications, foods,  dyes, or preservatives Pregnant or trying to get pregnant Breast-feeding How should I use this medication? This medication is for infusion into a vein. It is given in a hospital or clinic setting. Talk to your care team about the use of this medication in children. While this medication may be prescribed for selected conditions, precautions do apply. Overdosage: If you think you have taken too much of this medicine contact a poison control center or emergency room at once. NOTE: This medicine is only for you. Do not share this medicine with others. What if I miss a dose? This does not apply. What may interact with this medication? Certain medications for anxiety or sleep Certain medications for seizures, such phenobarbital Digoxin Medications that relax muscles for surgery Narcotic medications for pain This list may not describe all possible interactions. Give your health care provider a list of all the medicines, herbs, non-prescription drugs, or dietary supplements you use. Also tell them if you smoke, drink alcohol, or use illegal drugs. Some items may interact with your medicine. What should I watch for while using this medication? Your condition will be monitored carefully while you are receiving this medication. You may need blood work done while you are receiving this medication. What side effects may I notice from receiving this medication? Side effects that you should report to your care team as soon as possible: Allergic reactions--skin rash, itching, hives, swelling of the face, lips, tongue, or throat High magnesium  level--confusion, drowsiness, facial flushing, redness, sweating, muscle weakness, fast or irregular heartbeat, trouble breathing Low  blood pressure--dizziness, feeling faint or lightheaded, blurry vision Side effects that usually do not require medical attention (report to your care team if they continue or are bothersome): Headache Nausea This list may not  describe all possible side effects. Call your doctor for medical advice about side effects. You may report side effects to FDA at 1-800-FDA-1088. Where should I keep my medication? This medication is given in a hospital or clinic and will not be stored at home. NOTE: This sheet is a summary. It may not cover all possible information. If you have questions about this medicine, talk to your doctor, pharmacist, or health care provider.  2024 Elsevier/Gold Standard (2021-03-11 00:00:00)       To help prevent nausea and vomiting after your treatment, we encourage you to take your nausea medication as directed.  BELOW ARE SYMPTOMS THAT SHOULD BE REPORTED IMMEDIATELY: *FEVER GREATER THAN 100.4 F (38 C) OR HIGHER *CHILLS OR SWEATING *NAUSEA AND VOMITING THAT IS NOT CONTROLLED WITH YOUR NAUSEA MEDICATION *UNUSUAL SHORTNESS OF BREATH *UNUSUAL BRUISING OR BLEEDING *URINARY PROBLEMS (pain or burning when urinating, or frequent urination) *BOWEL PROBLEMS (unusual diarrhea, constipation, pain near the anus) TENDERNESS IN MOUTH AND THROAT WITH OR WITHOUT PRESENCE OF ULCERS (sore throat, sores in mouth, or a toothache) UNUSUAL RASH, SWELLING OR PAIN  UNUSUAL VAGINAL DISCHARGE OR ITCHING   Items with * indicate a potential emergency and should be followed up as soon as possible or go to the Emergency Department if any problems should occur.  Please show the CHEMOTHERAPY ALERT CARD or IMMUNOTHERAPY ALERT CARD at check-in to the Emergency Department and triage nurse.  Should you have questions after your visit or need to cancel or reschedule your appointment, please contact [facility] [place] - A DEPT OF [facility] [phone]  and follow the prompts.  Office hours are 8:00 a.m. to 4:30 p.m. Monday - Friday. Please note that voicemails left after 4:00 p.m. may not be returned until the following business day.  We are closed weekends and major holidays. You have access to a nurse  at all times for urgent questions. Please call the main number to the clinic [phone] and follow the prompts.  For any non-urgent questions, you may also contact your provider using MyChart. We now offer e-Visits for anyone 68 and older to request care online for non-urgent symptoms. For details visit [email or web address].   Also download the MyChart app! Go to the app store, search MyChart, open the app, select [place], and log in with your MyChart username and password.

## 2024-03-14 NOTE — Assessment & Plan Note (Signed)
 Chronic nausea and vomiting likely due to stomach cancer. Difficulty with food and fluid intake causing dehydration and weight loss.  - Will administer Zofran  injection in clinic. - Prescribe sublingual Zofran  for home use. - Prescribe Phenergan  for home use, alternate with Zofran  every four hours. - Administer IV fluids in clinic for dehydration.

## 2024-03-15 ENCOUNTER — Encounter (HOSPITAL_COMMUNITY): Payer: Self-pay

## 2024-03-15 ENCOUNTER — Emergency Department (HOSPITAL_COMMUNITY)

## 2024-03-15 ENCOUNTER — Other Ambulatory Visit: Payer: Self-pay

## 2024-03-15 ENCOUNTER — Inpatient Hospital Stay (HOSPITAL_COMMUNITY)
Admission: EM | Admit: 2024-03-15 | Discharge: 2024-03-17 | DRG: 375 | Disposition: A | Discharge status: Hospice | Attending: Family Medicine | Admitting: Family Medicine

## 2024-03-15 DIAGNOSIS — E785 Hyperlipidemia, unspecified: Secondary | ICD-10-CM | POA: Diagnosis present

## 2024-03-15 DIAGNOSIS — K5730 Diverticulosis of large intestine without perforation or abscess without bleeding: Secondary | ICD-10-CM | POA: Diagnosis not present

## 2024-03-15 DIAGNOSIS — C169 Malignant neoplasm of stomach, unspecified: Principal | ICD-10-CM | POA: Diagnosis present

## 2024-03-15 DIAGNOSIS — R112 Nausea with vomiting, unspecified: Secondary | ICD-10-CM | POA: Diagnosis present

## 2024-03-15 DIAGNOSIS — E46 Unspecified protein-calorie malnutrition: Secondary | ICD-10-CM | POA: Insufficient documentation

## 2024-03-15 DIAGNOSIS — E8809 Other disorders of plasma-protein metabolism, not elsewhere classified: Secondary | ICD-10-CM | POA: Diagnosis present

## 2024-03-15 DIAGNOSIS — Z881 Allergy status to other antibiotic agents status: Secondary | ICD-10-CM

## 2024-03-15 DIAGNOSIS — M1990 Unspecified osteoarthritis, unspecified site: Secondary | ICD-10-CM | POA: Diagnosis present

## 2024-03-15 DIAGNOSIS — I2510 Atherosclerotic heart disease of native coronary artery without angina pectoris: Secondary | ICD-10-CM | POA: Diagnosis present

## 2024-03-15 DIAGNOSIS — Z6823 Body mass index (BMI) 23.0-23.9, adult: Secondary | ICD-10-CM

## 2024-03-15 DIAGNOSIS — I252 Old myocardial infarction: Secondary | ICD-10-CM

## 2024-03-15 DIAGNOSIS — R1013 Epigastric pain: Principal | ICD-10-CM

## 2024-03-15 DIAGNOSIS — Z880 Allergy status to penicillin: Secondary | ICD-10-CM

## 2024-03-15 DIAGNOSIS — Z79899 Other long term (current) drug therapy: Secondary | ICD-10-CM

## 2024-03-15 DIAGNOSIS — E860 Dehydration: Secondary | ICD-10-CM | POA: Diagnosis present

## 2024-03-15 DIAGNOSIS — E441 Mild protein-calorie malnutrition: Secondary | ICD-10-CM | POA: Diagnosis present

## 2024-03-15 DIAGNOSIS — K429 Umbilical hernia without obstruction or gangrene: Secondary | ICD-10-CM | POA: Diagnosis not present

## 2024-03-15 DIAGNOSIS — J45909 Unspecified asthma, uncomplicated: Secondary | ICD-10-CM | POA: Diagnosis present

## 2024-03-15 DIAGNOSIS — I10 Essential (primary) hypertension: Secondary | ICD-10-CM | POA: Diagnosis present

## 2024-03-15 DIAGNOSIS — R109 Unspecified abdominal pain: Secondary | ICD-10-CM | POA: Insufficient documentation

## 2024-03-15 DIAGNOSIS — Z85028 Personal history of other malignant neoplasm of stomach: Secondary | ICD-10-CM

## 2024-03-15 DIAGNOSIS — Z66 Do not resuscitate: Secondary | ICD-10-CM | POA: Diagnosis present

## 2024-03-15 DIAGNOSIS — K449 Diaphragmatic hernia without obstruction or gangrene: Secondary | ICD-10-CM | POA: Diagnosis not present

## 2024-03-15 DIAGNOSIS — G893 Neoplasm related pain (acute) (chronic): Secondary | ICD-10-CM | POA: Diagnosis present

## 2024-03-15 DIAGNOSIS — Z515 Encounter for palliative care: Secondary | ICD-10-CM

## 2024-03-15 DIAGNOSIS — Z823 Family history of stroke: Secondary | ICD-10-CM

## 2024-03-15 DIAGNOSIS — Z9049 Acquired absence of other specified parts of digestive tract: Secondary | ICD-10-CM

## 2024-03-15 DIAGNOSIS — Z7902 Long term (current) use of antithrombotics/antiplatelets: Secondary | ICD-10-CM

## 2024-03-15 DIAGNOSIS — Z8249 Family history of ischemic heart disease and other diseases of the circulatory system: Secondary | ICD-10-CM

## 2024-03-15 LAB — COMPREHENSIVE METABOLIC PANEL WITH GFR
ALT: 9 U/L (ref 0–44)
AST: 15 U/L (ref 15–41)
Albumin: 3 g/dL — ABNORMAL LOW (ref 3.5–5.0)
Alkaline Phosphatase: 103 U/L (ref 38–126)
Anion gap: 10 (ref 5–15)
BUN: 16 mg/dL (ref 8–23)
CO2: 26 mmol/L (ref 22–32)
Calcium: 9.2 mg/dL (ref 8.9–10.3)
Chloride: 106 mmol/L (ref 98–111)
Creatinine, Ser: 1.14 mg/dL — ABNORMAL HIGH (ref 0.44–1.00)
GFR, Estimated: 46 mL/min — ABNORMAL LOW (ref >60)
Glucose, Bld: 113 mg/dL — ABNORMAL HIGH (ref 70–99)
Potassium: 3.9 mmol/L (ref 3.5–5.1)
Sodium: 142 mmol/L (ref 135–145)
Total Bilirubin: 0.9 mg/dL (ref 0.0–1.2)
Total Protein: 5.3 g/dL — ABNORMAL LOW (ref 6.5–8.1)

## 2024-03-15 LAB — CBC
HCT: 45.2 % (ref 36.0–46.0)
Hemoglobin: 14.5 g/dL (ref 12.0–15.0)
MCH: 30.8 pg (ref 26.0–34.0)
MCHC: 32.1 g/dL (ref 30.0–36.0)
MCV: 96 fL (ref 80.0–100.0)
Platelets: 220 K/uL (ref 150–400)
RBC: 4.71 MIL/uL (ref 3.87–5.11)
RDW: 12.9 % (ref 11.5–15.5)
WBC: 4.9 K/uL (ref 4.0–10.5)
nRBC: 0 % (ref 0.0–0.2)

## 2024-03-15 LAB — LIPASE, BLOOD: Lipase: 48 U/L (ref 11–51)

## 2024-03-15 MED ORDER — IOHEXOL 300 MG/ML  SOLN
100.0000 mL | Freq: Once | INTRAMUSCULAR | Status: AC | PRN
  Administered 2024-03-15: 100 mL via INTRAVENOUS

## 2024-03-15 MED ORDER — ONDANSETRON HCL 4 MG/2ML IJ SOLN
4.0000 mg | Freq: Once | INTRAMUSCULAR | Status: AC
  Administered 2024-03-15: 4 mg via INTRAVENOUS
  Filled 2024-03-15: qty 2

## 2024-03-15 NOTE — ED Notes (Signed)
 Patient transported to CT

## 2024-03-15 NOTE — ED Provider Notes (Signed)
 [facility] EMERGENCY DEPARTMENT AT [facility] Provider Note   CSN: [id number] Arrival date & time: 03/15/24  2044     Patient presents with: Emesis   Melissa Ross is a 89 y.o. female.  {Add pertinent medical, surgical, social history, OB history to HPI:32947} Patient is a 89 year old female with past medical history of stomach cancer diagnosed by endoscopy several weeks ago.  Patient has decided to not undergo treatment due to advanced age.  She presents here today with complaints of nausea and vomiting.  Son present at bedside states that the emesis is dark and green in color.  No diarrhea or constipation.  No bloody stools.  No fevers or chills.  No ill contacts.  No aggravating or alleviating factors.  She does describe some generalized abdominal cramping, but no localized or persistent pain.       Prior to Admission medications   Medication Sig Start Date End Date Taking? Authorizing Provider  Alum & Mag Hydroxide-Simeth (GI COCKTAIL) SUSP suspension Take 30 mLs by mouth 2 (two) times daily as needed for indigestion. Shake well. Patient not taking: Reported on 03/14/2024 03/07/24   Ahmed, Muhammad F, MD  amLODipine (NORVASC) 5 MG tablet Take by mouth. Patient not taking: Reported on 03/14/2024 04/01/20   [provider]  aspirin  81 MG chewable tablet Chew 1 tablet (81 mg total) by mouth daily. Patient not taking: Reported on 03/14/2024 11/04/15   Golda Claudis PENNER, MD  atorvastatin  (LIPITOR) 10 MG tablet Take 5 mg by mouth daily. Patient not taking: Reported on 03/14/2024 09/02/15   [provider]  Cholecalciferol (VITAMIN D3 GUMMIES ADULT PO) Take by mouth. Patient not taking: Reported on 03/14/2024    [provider]  clopidogrel  (PLAVIX ) 75 MG tablet Take 75 mg by mouth daily. Patient not taking: Reported on 03/14/2024    [provider]  cyanocobalamin 100 MCG tablet Take 100 mcg by mouth daily. Patient not taking: Reported on 03/14/2024     [provider]  lisinopril  (ZESTRIL ) 5 MG tablet Take 1 tablet (5 mg total) by mouth daily. Patient not taking: Reported on 03/14/2024 12/28/19   Triplett, Tammy, PA-C  nitroGLYCERIN  (NITROSTAT ) 0.4 MG SL tablet Place 1 tablet (0.4 mg total) under the tongue every 5 (five) minutes x 3 doses as needed for chest pain. Patient not taking: Reported on 03/14/2024 03/20/13   Levern Hutching, MD  ondansetron  (ZOFRAN -ODT) 8 MG disintegrating tablet Take 1 tablet (8 mg total) by mouth every 8 (eight) hours as needed for nausea or vomiting. 03/14/24   Davonna Siad, MD  OVER THE COUNTER MEDICATION nuropathy Patient not taking: Reported on 03/14/2024    [provider]  pantoprazole  (PROTONIX ) 40 MG tablet Take 1 tablet (40 mg total) by mouth 2 (two) times daily. 03/01/24 05/30/24  Ahmed, Muhammad F, MD  polyethylene glycol powder (GLYCOLAX /MIRALAX ) 17 GM/SCOOP powder Take 8.5 g by mouth daily. Patient not taking: Reported on 03/14/2024 02/20/24   Ahmed, Muhammad F, MD  promethazine  (PHENERGAN ) 25 MG tablet Take 1 tablet (25 mg total) by mouth every 6 (six) hours as needed for nausea or vomiting. 03/07/24   Ahmed, Deatrice FALCON, MD  zolpidem  (AMBIEN ) 5 MG tablet Take 1 tablet (5 mg total) by mouth at bedtime as needed for sleep. 03/14/24   Kandala, Hyndavi, MD  famotidine  (PEPCID ) 20 MG tablet Take 1 tablet (20 mg total) by mouth 2 (two) times daily. Patient taking differently: Take 20 mg by mouth daily. 03/20/13 12/28/19  Levern Hutching, MD    Allergies: Bacitracin, Neosporin [neomycin-bacitracin zn-polymyx], Polymyxin b, and Penicillins    Review of Systems  All other systems reviewed and are negative.   Updated Vital Signs BP (!) 167/75   Pulse 84   Resp 11   Ht 5' 5 (1.651 m)   Wt 63.7 kg   SpO2 92%   BMI 23.37 kg/m   Physical Exam Vitals and nursing note reviewed.  Constitutional:      General: She is not in acute distress.    Appearance: She is well-developed. She is not diaphoretic.   HENT:     Head: Normocephalic and atraumatic.  Cardiovascular:     Rate and Rhythm: Normal rate and regular rhythm.     Heart sounds: No murmur heard.    No friction rub. No gallop.  Pulmonary:     Effort: Pulmonary effort is normal. No respiratory distress.     Breath sounds: Normal breath sounds. No wheezing.  Abdominal:     General: Bowel sounds are normal. There is no distension.     Palpations: Abdomen is soft.     Tenderness: There is abdominal tenderness. There is no right CVA tenderness, left CVA tenderness, guarding or rebound.     Comments: There is generalized abdominal tenderness with  Musculoskeletal:        General: Normal range of motion.     Cervical back: Normal range of motion and neck supple.  Skin:    General: Skin is warm and dry.  Neurological:     General: No focal deficit present.     Mental Status: She is alert and oriented to person, place, and time.     (all labs ordered are listed, but only abnormal results are displayed) Labs Reviewed  COMPREHENSIVE METABOLIC PANEL WITH GFR - Abnormal; Notable for the following components:      Result Value   Glucose, Bld 113 (*)    Creatinine, Ser 1.14 (*)    Total Protein 5.3 (*)    Albumin 3.0 (*)    GFR, Estimated 46 (*)    All other components within normal limits  LIPASE, BLOOD  CBC  URINALYSIS, ROUTINE W REFLEX MICROSCOPIC    EKG: None  Radiology: No results found.  {Document cardiac monitor, telemetry assessment procedure when appropriate:32947} Procedures   Medications Ordered in the ED  iohexol  (OMNIPAQUE ) 300 MG/ML solution 100 mL (has no administration in time range)  ondansetron  (ZOFRAN ) injection 4 mg (4 mg Intravenous Given 03/15/24 2228)      {Click here for ABCD2, HEART and other calculators REFRESH Note before signing:1}                              Medical Decision Making Amount and/or Complexity of Data Reviewed Labs: ordered.   ***  {Document critical care time when  appropriate  Document review of labs and clinical decision tools ie CHADS2VASC2, etc  Document your independent review of radiology images and any outside records  Document your discussion with family members, caretakers and with consultants  Document social determinants of health affecting pt's care  Document your decision making why or why not admission, treatments were needed:32947:::1}   Final diagnoses:  None    ED Discharge Orders     None

## 2024-03-15 NOTE — ED Triage Notes (Signed)
 [facility] from home. Cc of emesis x3 at home and weakness. Has stomach cancer-unknown stage- last visit to cancer center yesterday. Denies nausea now. 500ml ns in 20g l ac.  Only c/o weakness. Slept with ems on ambulance.

## 2024-03-16 ENCOUNTER — Other Ambulatory Visit: Payer: Self-pay

## 2024-03-16 DIAGNOSIS — Z880 Allergy status to penicillin: Secondary | ICD-10-CM | POA: Diagnosis not present

## 2024-03-16 DIAGNOSIS — Z9049 Acquired absence of other specified parts of digestive tract: Secondary | ICD-10-CM | POA: Diagnosis not present

## 2024-03-16 DIAGNOSIS — C169 Malignant neoplasm of stomach, unspecified: Secondary | ICD-10-CM | POA: Diagnosis not present

## 2024-03-16 DIAGNOSIS — E860 Dehydration: Secondary | ICD-10-CM | POA: Diagnosis not present

## 2024-03-16 DIAGNOSIS — E46 Unspecified protein-calorie malnutrition: Secondary | ICD-10-CM | POA: Diagnosis not present

## 2024-03-16 DIAGNOSIS — R1013 Epigastric pain: Secondary | ICD-10-CM | POA: Diagnosis present

## 2024-03-16 DIAGNOSIS — Z7189 Other specified counseling: Secondary | ICD-10-CM | POA: Diagnosis not present

## 2024-03-16 DIAGNOSIS — Z7902 Long term (current) use of antithrombotics/antiplatelets: Secondary | ICD-10-CM | POA: Diagnosis not present

## 2024-03-16 DIAGNOSIS — E8809 Other disorders of plasma-protein metabolism, not elsewhere classified: Secondary | ICD-10-CM

## 2024-03-16 DIAGNOSIS — Z881 Allergy status to other antibiotic agents status: Secondary | ICD-10-CM | POA: Diagnosis not present

## 2024-03-16 DIAGNOSIS — J45909 Unspecified asthma, uncomplicated: Secondary | ICD-10-CM | POA: Diagnosis present

## 2024-03-16 DIAGNOSIS — G893 Neoplasm related pain (acute) (chronic): Secondary | ICD-10-CM | POA: Diagnosis present

## 2024-03-16 DIAGNOSIS — Z6823 Body mass index (BMI) 23.0-23.9, adult: Secondary | ICD-10-CM | POA: Diagnosis not present

## 2024-03-16 DIAGNOSIS — Z823 Family history of stroke: Secondary | ICD-10-CM | POA: Diagnosis not present

## 2024-03-16 DIAGNOSIS — Z743 Need for continuous supervision: Secondary | ICD-10-CM | POA: Diagnosis not present

## 2024-03-16 DIAGNOSIS — R112 Nausea with vomiting, unspecified: Secondary | ICD-10-CM | POA: Diagnosis not present

## 2024-03-16 DIAGNOSIS — Z558 Other problems related to education and literacy: Secondary | ICD-10-CM | POA: Diagnosis not present

## 2024-03-16 DIAGNOSIS — R262 Difficulty in walking, not elsewhere classified: Secondary | ICD-10-CM | POA: Diagnosis not present

## 2024-03-16 DIAGNOSIS — I2510 Atherosclerotic heart disease of native coronary artery without angina pectoris: Secondary | ICD-10-CM | POA: Diagnosis present

## 2024-03-16 DIAGNOSIS — Z66 Do not resuscitate: Secondary | ICD-10-CM | POA: Diagnosis present

## 2024-03-16 DIAGNOSIS — I10 Essential (primary) hypertension: Secondary | ICD-10-CM | POA: Diagnosis present

## 2024-03-16 DIAGNOSIS — E785 Hyperlipidemia, unspecified: Secondary | ICD-10-CM | POA: Diagnosis present

## 2024-03-16 DIAGNOSIS — R109 Unspecified abdominal pain: Secondary | ICD-10-CM

## 2024-03-16 DIAGNOSIS — M1990 Unspecified osteoarthritis, unspecified site: Secondary | ICD-10-CM | POA: Diagnosis present

## 2024-03-16 DIAGNOSIS — Z79899 Other long term (current) drug therapy: Secondary | ICD-10-CM | POA: Diagnosis not present

## 2024-03-16 DIAGNOSIS — Z8249 Family history of ischemic heart disease and other diseases of the circulatory system: Secondary | ICD-10-CM | POA: Diagnosis not present

## 2024-03-16 DIAGNOSIS — Z515 Encounter for palliative care: Secondary | ICD-10-CM | POA: Diagnosis not present

## 2024-03-16 DIAGNOSIS — I252 Old myocardial infarction: Secondary | ICD-10-CM | POA: Diagnosis not present

## 2024-03-16 DIAGNOSIS — E441 Mild protein-calorie malnutrition: Secondary | ICD-10-CM | POA: Diagnosis present

## 2024-03-16 LAB — PHOSPHORUS: Phosphorus: 3.7 mg/dL (ref 2.5–4.6)

## 2024-03-16 LAB — COMPREHENSIVE METABOLIC PANEL WITH GFR
ALT: 11 U/L (ref 0–44)
AST: 16 U/L (ref 15–41)
Albumin: 3 g/dL — ABNORMAL LOW (ref 3.5–5.0)
Alkaline Phosphatase: 98 U/L (ref 38–126)
Anion gap: 7 (ref 5–15)
BUN: 16 mg/dL (ref 8–23)
CO2: 25 mmol/L (ref 22–32)
Calcium: 9.1 mg/dL (ref 8.9–10.3)
Chloride: 108 mmol/L (ref 98–111)
Creatinine, Ser: 1.12 mg/dL — ABNORMAL HIGH (ref 0.44–1.00)
GFR, Estimated: 47 mL/min — ABNORMAL LOW (ref >60)
Glucose, Bld: 119 mg/dL — ABNORMAL HIGH (ref 70–99)
Potassium: 4 mmol/L (ref 3.5–5.1)
Sodium: 140 mmol/L (ref 135–145)
Total Bilirubin: 0.9 mg/dL (ref 0.0–1.2)
Total Protein: 5.3 g/dL — ABNORMAL LOW (ref 6.5–8.1)

## 2024-03-16 LAB — CBC
HCT: 42 % (ref 36.0–46.0)
Hemoglobin: 13.7 g/dL (ref 12.0–15.0)
MCH: 30.5 pg (ref 26.0–34.0)
MCHC: 32.6 g/dL (ref 30.0–36.0)
MCV: 93.5 fL (ref 80.0–100.0)
Platelets: 259 K/uL (ref 150–400)
RBC: 4.49 MIL/uL (ref 3.87–5.11)
RDW: 13 % (ref 11.5–15.5)
WBC: 5.6 K/uL (ref 4.0–10.5)
nRBC: 0 % (ref 0.0–0.2)

## 2024-03-16 LAB — MAGNESIUM: Magnesium: 1.8 mg/dL (ref 1.7–2.4)

## 2024-03-16 LAB — URINALYSIS, ROUTINE W REFLEX MICROSCOPIC
Bilirubin Urine: NEGATIVE
Glucose, UA: NEGATIVE mg/dL
Hgb urine dipstick: NEGATIVE
Ketones, ur: NEGATIVE mg/dL
Leukocytes,Ua: NEGATIVE
Nitrite: NEGATIVE
Protein, ur: NEGATIVE mg/dL
Specific Gravity, Urine: 1.042 — ABNORMAL HIGH (ref 1.005–1.030)
pH: 5 (ref 5.0–8.0)

## 2024-03-16 MED ORDER — ACETAMINOPHEN 325 MG PO TABS
650.0000 mg | ORAL_TABLET | Freq: Four times a day (QID) | ORAL | Status: DC | PRN
Start: 2024-03-16 — End: 2024-03-18

## 2024-03-16 MED ORDER — ONDANSETRON HCL 4 MG/2ML IJ SOLN
4.0000 mg | Freq: Once | INTRAMUSCULAR | Status: AC
  Administered 2024-03-16: 4 mg via INTRAVENOUS
  Filled 2024-03-16: qty 2

## 2024-03-16 MED ORDER — FENTANYL CITRATE PF 50 MCG/ML IJ SOSY
25.0000 ug | PREFILLED_SYRINGE | INTRAMUSCULAR | Status: DC | PRN
  Administered 2024-03-17: 25 ug via INTRAVENOUS
  Filled 2024-03-16: qty 1

## 2024-03-16 MED ORDER — PROCHLORPERAZINE 25 MG RE SUPP
25.0000 mg | Freq: Once | RECTAL | Status: AC
  Administered 2024-03-16: 25 mg via RECTAL
  Filled 2024-03-16: qty 1

## 2024-03-16 MED ORDER — MORPHINE SULFATE (PF) 2 MG/ML IV SOLN: 2.0000 mg | INTRAVENOUS | Status: DC | PRN

## 2024-03-16 MED ORDER — POLYETHYLENE GLYCOL 3350 17 G PO PACK
17.0000 g | PACK | Freq: Every day | ORAL | Status: DC
  Administered 2024-03-16: 17 g via ORAL
  Filled 2024-03-16: qty 1

## 2024-03-16 MED ORDER — ENOXAPARIN SODIUM 30 MG/0.3ML IJ SOSY
30.0000 mg | PREFILLED_SYRINGE | INTRAMUSCULAR | Status: DC
  Administered 2024-03-16: 30 mg via SUBCUTANEOUS
  Filled 2024-03-16: qty 0.3

## 2024-03-16 MED ORDER — ACETAMINOPHEN 650 MG RE SUPP: 650.0000 mg | Freq: Four times a day (QID) | RECTAL | Status: DC | PRN

## 2024-03-16 MED ORDER — MORPHINE SULFATE (PF) 4 MG/ML IV SOLN
4.0000 mg | Freq: Once | INTRAVENOUS | Status: AC
  Administered 2024-03-16: 4 mg via INTRAVENOUS
  Filled 2024-03-16: qty 1

## 2024-03-16 MED ORDER — OLANZAPINE 5 MG PO TABS
2.5000 mg | ORAL_TABLET | Freq: Every day | ORAL | Status: DC
  Administered 2024-03-16: 2.5 mg via ORAL
  Filled 2024-03-16: qty 1

## 2024-03-16 MED ORDER — OXYCODONE HCL 5 MG PO TABS
5.0000 mg | ORAL_TABLET | ORAL | Status: DC | PRN
  Administered 2024-03-16: 5 mg via ORAL
  Filled 2024-03-16: qty 1

## 2024-03-16 MED ORDER — LACTATED RINGERS IV SOLN: INTRAVENOUS | Status: AC

## 2024-03-16 MED ORDER — LORAZEPAM 2 MG/ML IJ SOLN
0.5000 mg | INTRAMUSCULAR | Status: DC | PRN
  Administered 2024-03-16 – 2024-03-17 (×3): 0.5 mg via INTRAVENOUS
  Filled 2024-03-16 (×4): qty 1

## 2024-03-16 MED ORDER — ONDANSETRON HCL 4 MG/2ML IJ SOLN
4.0000 mg | Freq: Four times a day (QID) | INTRAMUSCULAR | Status: DC | PRN
  Administered 2024-03-16 – 2024-03-17 (×2): 4 mg via INTRAVENOUS
  Filled 2024-03-16 (×2): qty 2

## 2024-03-16 MED ORDER — ENSURE PLUS HIGH PROTEIN PO LIQD
237.0000 mL | Freq: Two times a day (BID) | ORAL | Status: DC
  Administered 2024-03-16: 237 mL via ORAL

## 2024-03-16 MED ORDER — POLYETHYLENE GLYCOL 3350 17 G PO PACK: 17.0000 g | PACK | Freq: Every day | ORAL | Status: DC

## 2024-03-16 MED ORDER — PROCHLORPERAZINE EDISYLATE 10 MG/2ML IJ SOLN
10.0000 mg | Freq: Four times a day (QID) | INTRAMUSCULAR | Status: DC | PRN
  Administered 2024-03-16 (×2): 10 mg via INTRAVENOUS
  Filled 2024-03-16 (×2): qty 2

## 2024-03-16 MED ORDER — BOOST / RESOURCE BREEZE PO LIQD CUSTOM: 1.0000 | ORAL | Status: DC

## 2024-03-16 NOTE — Evaluation (Signed)
 Physical Therapy Evaluation Patient Details Name: Melissa Ross MRN: [id number] DOB: 05/09/1931 Today's Date: 03/16/2024  History of Present Illness  Melissa Ross is a 89 y.o. female with medical history significant of hypertension, hyperlipidemia, CAD, cancer of the stomach diagnosed by endoscopy few weeks ago who presents to the emergency department due to nausea and vomiting.  Vomitus was described as dark and green in color and was associated with generalized abdominal cramping.  She denies diarrhea, constipation, chest pain or shortness of breath   Clinical Impression  Patient demonstrates slow labored movement for sitting up at bedside requiring verbal/tactile cueing for scooting to EOB with fair carryover, once seated had episode of falling backwards onto to bed due to weakness. Patient very unsteady on feet and limited to a few steps forward/backwards before having to sit due to c/o fatigue and weakness. Patient tolerated sitting up in chair after therapy. Patient will benefit from continued skilled physical therapy in hospital and recommended venue below to increase strength, balance, endurance for safe ADLs and gait.          If plan is discharge home, recommend the following: A lot of help with walking and/or transfers;Help with stairs or ramp for entrance;Assistance with cooking/housework;A lot of help with bathing/dressing/bathroom   Can travel by private vehicle   Yes    Equipment Recommendations None recommended by PT  Recommendations for Other Services       Functional Status Assessment Patient has had a recent decline in their functional status and demonstrates the ability to make significant improvements in function in a reasonable and predictable amount of time.     Precautions / Restrictions Precautions Precautions: Fall Recall of Precautions/Restrictions: Intact Restrictions Weight Bearing Restrictions Per Provider Order: No      Mobility  Bed  Mobility Overal bed mobility: Needs Assistance Bed Mobility: Supine to Sit     Supine to sit: Min assist, HOB elevated     General bed mobility comments: increased time, labored movement with difficulty scooting to EOB    Transfers Overall transfer level: Needs assistance Equipment used: Rolling walker (2 wheels) Transfers: Sit to/from Stand, Bed to chair/wheelchair/BSC Sit to Stand: Min assist, Mod assist   Step pivot transfers: Min assist, Mod assist       General transfer comment: had difficulty completing sit to stands, transferring to chair due to weakness and c/o nausea    Ambulation/Gait Ambulation/Gait assistance: Mod assist Gait Distance (Feet): 8 Feet Assistive device: Rolling walker (2 wheels) Gait Pattern/deviations: Decreased step length - right, Decreased step length - left, Decreased stride length, Trunk flexed Gait velocity: slow     General Gait Details: limited to a few steps forward/backwards at bedside before having to sit due to c/o fatigue, weakness  Stairs            Wheelchair Mobility     Tilt Bed    Modified Rankin (Stroke Patients Only)       Balance Overall balance assessment: Needs assistance Sitting-balance support: Feet supported, No upper extremity supported Sitting balance-Leahy Scale: Poor Sitting balance - Comments: fair/poor seated at EOB Postural control: Posterior lean Standing balance support: Bilateral upper extremity supported, During functional activity, Reliant on assistive device for balance Standing balance-Leahy Scale: Poor Standing balance comment: fair/poor using RW                             Pertinent Vitals/Pain Pain Assessment Pain Assessment: No/denies pain  Home Living Family/patient expects to be discharged to:: Private residence Living Arrangements: Spouse/significant other Available Help at Discharge: Family;Available 24 hours/day;Personal care attendant Type of Home:  House Home Access: Stairs to enter Entrance Stairs-Rails: None Entrance Stairs-Number of Steps: 1   Home Layout: One level Home Equipment: Shower seat - built in;Grab bars - tub/shower;Cane - single Librarian, academic (2 wheels);Wheelchair - manual Additional Comments: Care attendant M, W, F for 4 hours. PCA typically cooks and cleans.    Prior Function Prior Level of Function : Needs assist       Physical Assist : ADLs (physical);Mobility (physical) Mobility (physical): Transfers;Gait;Stairs;Bed mobility ADLs (physical): Bathing;Dressing;IADLs Mobility Comments: Pt reports she only ambulates with supervision and SPC since she has been weak over the past 3 months. Assisted out of bed recently as well. ADLs Comments: Reports shower transfer assist and dressing assist. Assisted IADL's by PCA.     Extremity/Trunk Assessment   Upper Extremity Assessment Upper Extremity Assessment: Defer to OT evaluation    Lower Extremity Assessment Lower Extremity Assessment: Generalized weakness    Cervical / Trunk Assessment Cervical / Trunk Assessment: Kyphotic  Communication   Communication Communication: Impaired Factors Affecting Communication: Hearing impaired    Cognition Arousal: Alert Behavior During Therapy: WFL for tasks assessed/performed   PT - Cognitive impairments: No apparent impairments                         Following commands: Intact       Cueing Cueing Techniques: Verbal cues, Tactile cues     General Comments      Exercises     Assessment/Plan    PT Assessment Patient needs continued PT services  PT Problem List Decreased strength;Decreased activity tolerance;Decreased balance;Decreased mobility       PT Treatment Interventions DME instruction;Gait training;Stair training;Functional mobility training;Therapeutic activities;Therapeutic exercise;Balance training;Patient/family education    PT Goals (Current goals can be found in the Care  Plan section)  Acute Rehab PT Goals Patient Stated Goal: return home with family, home aides to assist PT Goal Formulation: With patient Time For Goal Achievement: 03/30/24 Potential to Achieve Goals: Good    Frequency Min 3X/week     Co-evaluation PT/OT/SLP Co-Evaluation/Treatment: Yes Reason for Co-Treatment: To address functional/ADL transfers PT goals addressed during session: Mobility/safety with mobility;Balance;Proper use of DME OT goals addressed during session: ADL's and self-care       AM-PAC PT 6 Clicks Mobility  Outcome Measure Help needed turning from your back to your side while in a flat bed without using bedrails?: A Little Help needed moving from lying on your back to sitting on the side of a flat bed without using bedrails?: A Lot Help needed moving to and from a bed to a chair (including a wheelchair)?: A Lot Help needed standing up from a chair using your arms (e.g., wheelchair or bedside chair)?: A Lot Help needed to walk in hospital room?: A Lot Help needed climbing 3-5 steps with a railing? : A Lot 6 Click Score: 13    End of Session   Activity Tolerance: Patient tolerated treatment well;Patient limited by fatigue Patient left: in chair;with call bell/phone within reach;with chair alarm set Nurse Communication: Mobility status PT Visit Diagnosis: Unsteadiness on feet (R26.81);Other abnormalities of gait and mobility (R26.89);Muscle weakness (generalized) (M62.81)    Time: 9179-9159 PT Time Calculation (min) (ACUTE ONLY): 20 min   Charges:   PT Evaluation $PT Eval Moderate Complexity: 1 Mod PT Treatments $Therapeutic Activity:  8-22 mins PT General Charges $$ ACUTE PT VISIT: 1 Visit         12:13 PM, 03/16/24 Lynwood Music, MPT Physical Therapist with [facility] [phone] office [phone] mobile phone

## 2024-03-16 NOTE — H&P (Addendum)
 History and Physical    Patient: Melissa Ross FMW:[id number] DOB: 06-14-1931 DOA: 03/15/2024 DOS: the patient was seen and examined on 03/16/2024 PCP: [name], MD  Patient coming from: Home  Chief Complaint:  Chief Complaint  Patient presents with   Emesis   HPI: Melissa Ross is a 89 y.o. female with medical history significant of hypertension, hyperlipidemia, CAD, cancer of the stomach diagnosed by endoscopy few weeks ago who presents to the emergency department due to nausea and vomiting.  Vomitus was described as dark and green in color and was associated with generalized abdominal cramping.  She denies diarrhea, constipation, chest pain or shortness of breath  ED Course:  In the emergency department, BP was 162/65, other vital signs were within normal range.  Workup in the ED showed normal CBC and BMP except for blood glucose of 113, creatinine 1.14, albumin 3.0, lipase 48, urinalysis was normal. CT abdomen and pelvis with contrast showed moderate circumferential thickening of the gastric body with disproportionate mucosal enhancement along the thickening. Findings consistent with known gastric carcinoma. Patient was traveling morphine , Zofran .  TRH was asked to admit patient  Review of Systems: Review of systems as noted in the HPI. All other systems reviewed and are negative.   Past Medical History:  Diagnosis Date   Arthritis    Asthma    GERD (gastroesophageal reflux disease)    Hypertension    Myocardial infarction (HCC) 03/2013   Respiratory failure [facility])    August 2016, requiring intubation   Ventral hernia    Past Surgical History:  Procedure Laterality Date   APPENDECTOMY     CATARACT EXTRACTION W/PHACO  11/09/2011   Procedure: CATARACT EXTRACTION PHACO AND INTRAOCULAR LENS PLACEMENT (IOC);  Surgeon: Oneil T. Roz, MD;  Location: [facility] ORS;  Service: Ophthalmology;  Laterality: Right;  CDE 10.01   CHOLECYSTECTOMY     COLONOSCOPY N/A 10/30/2015    Procedure: COLONOSCOPY;  Surgeon: Claudis RAYMOND Rivet, MD;  Location: [facility] ENDO SUITE;  Service: Endoscopy;  Laterality: N/A;  2:20 - moved to 4/20 @ 1:25   ESOPHAGOGASTRODUODENOSCOPY N/A 03/01/2024   Procedure: EGD (ESOPHAGOGASTRODUODENOSCOPY);  Surgeon: Cinderella Deatrice FALCON, MD;  Location: [facility] ENDO SUITE;  Service: Endoscopy;  Laterality: N/A;  945AM, OK RM 1-2   ESOPHAGOGASTRODUODENOSCOPY (EGD) WITH ESOPHAGEAL DILATION  05/11/2012   Procedure: ESOPHAGOGASTRODUODENOSCOPY (EGD) WITH ESOPHAGEAL DILATION;  Surgeon: Claudis RAYMOND Rivet, MD;  Location: [facility] ENDO SUITE;  Service: Endoscopy;  Laterality: N/A;   EYE SURGERY     left KPE   INSERTION OF MESH  07/17/2012   Procedure: INSERTION OF MESH;  Surgeon: Camellia CHRISTELLA Blush, MD,FACS;  Location: [facility] OR;  Service: General;  Laterality: N/A;   LEFT HEART CATHETERIZATION WITH CORONARY ANGIOGRAM N/A 03/19/2013   Procedure: LEFT HEART CATHETERIZATION WITH CORONARY ANGIOGRAM;  Surgeon: Rober LOISE Chroman, MD;  Location: [facility] CATH LAB;  Service: Cardiovascular;  Laterality: N/A;   TOE SURGERY     right foot   TONSILLECTOMY     TUBAL LIGATION     VENTRAL HERNIA REPAIR  07/17/2012   Procedure: LAPAROSCOPIC VENTRAL HERNIA;  Surgeon: Camellia CHRISTELLA Blush, MD,FACS;  Location: [facility] OR;  Service: General;  Laterality: N/A;    Social History:  reports that she has never smoked. She has never used smokeless tobacco. She reports that she does not drink alcohol and does not use drugs.   Allergies  Allergen Reactions   Bacitracin Other (See Comments)    Not known/From OSH records [facility]  Not known/From OSH records [facility]   Neosporin [Neomycin-Bacitracin Zn-Polymyx] Other (See Comments)    blisters   Polymyxin B Other (See Comments)    Not known/From OSH records [facility]  Not known/From OSH records [facility]   Penicillins Swelling and Rash    Has patient had a PCN reaction causing immediate rash, facial/tongue/throat swelling, SOB or lightheadedness with  hypotension:unknown Has patient had a PCN reaction causing severe rash involving mucus membranes or skin necrosis: unknown Has patient had a PCN reaction that required hospitalization: unknown Has patient had a PCN reaction occurring within the last 10 years: unknown If all of the above answers are NO, then may proceed with Cephalosporin use.      Family History  Problem Relation Age of Onset   Heart disease Mother    Stroke Mother    Stroke Father    Heart disease Father    Hypertension Sister    Cancer Brother        liver   Breast cancer Other    Ovarian cancer Other    Colon cancer Other    Malignant hyperthermia Neg Hx    Pseudochol deficiency Neg Hx    Hypotension Neg Hx    Anesthesia problems Neg Hx      Prior to Admission medications   Medication Sig Start Date End Date Taking? Authorizing Provider  Alum & Mag Hydroxide-Simeth (GI COCKTAIL) SUSP suspension Take 30 mLs by mouth 2 (two) times daily as needed for indigestion. Shake well. Patient not taking: Reported on 03/14/2024 03/07/24   Ahmed, Muhammad F, MD  amLODipine (NORVASC) 5 MG tablet Take by mouth. Patient not taking: Reported on 03/14/2024 04/01/20   [provider]  aspirin  81 MG chewable tablet Chew 1 tablet (81 mg total) by mouth daily. Patient not taking: Reported on 03/14/2024 11/04/15   Golda Claudis PENNER, MD  atorvastatin  (LIPITOR) 10 MG tablet Take 5 mg by mouth daily. Patient not taking: Reported on 03/14/2024 09/02/15   [provider]  Cholecalciferol (VITAMIN D3 GUMMIES ADULT PO) Take by mouth. Patient not taking: Reported on 03/14/2024    [provider]  clopidogrel  (PLAVIX ) 75 MG tablet Take 75 mg by mouth daily. Patient not taking: Reported on 03/14/2024    [provider]  cyanocobalamin 100 MCG tablet Take 100 mcg by mouth daily. Patient not taking: Reported on 03/14/2024    [provider]  lisinopril  (ZESTRIL ) 5 MG tablet Take 1 tablet (5 mg total) by mouth  daily. Patient not taking: Reported on 03/14/2024 12/28/19   Triplett, Tammy, PA-C  nitroGLYCERIN  (NITROSTAT ) 0.4 MG SL tablet Place 1 tablet (0.4 mg total) under the tongue every 5 (five) minutes x 3 doses as needed for chest pain. Patient not taking: Reported on 03/14/2024 03/20/13   Levern Hutching, MD  ondansetron  (ZOFRAN -ODT) 8 MG disintegrating tablet Take 1 tablet (8 mg total) by mouth every 8 (eight) hours as needed for nausea or vomiting. 03/14/24   Davonna Siad, MD  OVER THE COUNTER MEDICATION nuropathy Patient not taking: Reported on 03/14/2024    [provider]  pantoprazole  (PROTONIX ) 40 MG tablet Take 1 tablet (40 mg total) by mouth 2 (two) times daily. 03/01/24 05/30/24  Ahmed, Muhammad F, MD  polyethylene glycol powder (GLYCOLAX /MIRALAX ) 17 GM/SCOOP powder Take 8.5 g by mouth daily. Patient not taking: Reported on 03/14/2024 02/20/24   Cinderella Deatrice FALCON, MD  promethazine  (PHENERGAN ) 25 MG tablet Take 1 tablet (25 mg total) by mouth every 6 (  six) hours as needed for nausea or vomiting. 03/07/24   Ahmed, Deatrice FALCON, MD  zolpidem  (AMBIEN ) 5 MG tablet Take 1 tablet (5 mg total) by mouth at bedtime as needed for sleep. 03/14/24   Kandala, Hyndavi, MD  famotidine  (PEPCID ) 20 MG tablet Take 1 tablet (20 mg total) by mouth 2 (two) times daily. Patient taking differently: Take 20 mg by mouth daily. 03/20/13 12/28/19  Levern Hutching, MD    Physical Exam: BP 124/69 (BP Location: Right Arm)   Pulse 94   Temp 97.7 F (36.5 C)   Resp 15   Ht 5' 5 (1.651 m)   Wt 64.7 kg   SpO2 92%   BMI 23.74 kg/m   General: 89 y.o. year-old female well developed well nourished in no acute distress.  Alert and oriented x3. HEENT: NCAT, EOMI, dry mucous membrane Neck: Supple, trachea medial Cardiovascular: Regular rate and rhythm with no rubs or gallops.  No thyromegaly or JVD noted.  No lower extremity edema. 2/4 pulses in all 4 extremities. Respiratory: Clear to auscultation with no wheezes or rales. Good  inspiratory effort. Abdomen: Soft, tender to palpation without guarding.  Nondistended with normal bowel sounds x4 quadrants. Muskuloskeletal: No cyanosis, clubbing or edema noted bilaterally Neuro: CN II-XII intact,  sensation, reflexes intact Skin: Decreased skin turgor.  No ulcerative lesions noted or rashes Psychiatry: Judgement and insight appear normal. Mood is appropriate for condition and setting          Labs on Admission:  Basic Metabolic Panel: Recent Labs  Lab 03/14/24 1143 03/15/24 2131 03/16/24 0528  NA 136 142 140  K 4.3 3.9 4.0  CL 101 106 108  CO2 23 26 25   GLUCOSE 130* 113* 119*  BUN 18 16 16   CREATININE 1.20* 1.14* 1.12*  CALCIUM  9.6 9.2 9.1  MG 1.9  --  1.8   Liver Function Tests: Recent Labs  Lab 03/14/24 1143 03/15/24 2131 03/16/24 0528  AST 18 15 16   ALT 12 9 11   ALKPHOS 127* 103 98  BILITOT 1.0 0.9 0.9  PROT 6.4* 5.3* 5.3*  ALBUMIN 3.6 3.0* 3.0*   Recent Labs  Lab 03/15/24 2131  LIPASE 48   No results for input(s): AMMONIA in the last 168 hours. CBC: Recent Labs  Lab 03/14/24 1143 03/15/24 2131 03/16/24 0528  WBC 5.6 4.9 5.6  NEUTROABS 3.0  --   --   HGB 16.1* 14.5 13.7  HCT 48.9* 45.2 42.0  MCV 93.3 96.0 93.5  PLT 316 220 259   Cardiac Enzymes: No results for input(s): CKTOTAL, CKMB, CKMBINDEX, TROPONINI in the last 168 hours.  BNP (last 3 results) No results for input(s): BNP in the last 8760 hours.  ProBNP (last 3 results) No results for input(s): PROBNP in the last 8760 hours.  CBG: No results for input(s): GLUCAP in the last 168 hours.  Radiological Exams on Admission: CT ABDOMEN PELVIS W CONTRAST Result Date: 03/15/2024 CLINICAL DATA:  Abdominal pain, acute, nonlocalized. Bowel obstruction suspected. Stomach cancer unknown stage. EXAM: CT ABDOMEN AND PELVIS WITH CONTRAST TECHNIQUE: Multidetector CT imaging of the abdomen and pelvis was performed using the standard protocol following bolus  administration of intravenous contrast. RADIATION DOSE REDUCTION: This exam was performed according to the departmental dose-optimization program which includes automated exposure control, adjustment of the mA and/or kV according to patient size and/or use of iterative reconstruction technique. CONTRAST:  OMNIPAQUE  IOHEXOL  300 MG/ML  SOLN COMPARISON:  CTs with IV contrast 08/16/2023 and 08/26/2021. FINDINGS:  Lower chest: Chronic linear scar-like opacities both lower lobes. There is a chronic 3 mm noncalcified nodule laterally in the left lower lobe on the first image, and a stable chronic 4 mm left lower lobe nodule on 6:3. Benign process given length of stability. Also stable is a 5 mm medial basal right lower lobe nodule on 6:10 and a 2 mm subpleural right middle lobe nodule anteriorly on 6:14. Hepatobiliary: No focal liver abnormality is seen. Status post cholecystectomy. No biliary dilatation. Pancreas: No abnormality. Spleen: No abnormality. Adrenals/Urinary Tract: There are occasional subcentimeter left renal cortical Bosniak 2 cysts which are too small to characterize. No follow-up imaging is recommended. There is no adrenal or renal mass enhancement, no urinary stone or obstruction. The bladder unremarkable for the degree of distention. Stomach/Bowel: Moderate circumferential thickening of the gastric body again is noted with disproportionate mucosal enhancement along the thickening. Just above this along the lesser gastric curve, there is hazy interstitial change in the adjacent fat, which could be due to inflammation or could be from lymphangitic carcinomatosis. Small hiatal hernia.  Normal caliber unopacified small bowel. The appendix is not seen. There is diffuse diverticulosis of the colon with no evidence of acute diverticulitis. Vascular/Lymphatic: Heavy aortic and proximal visceral branch vessel atherosclerosis. No AAA. No dissection or aortic stenosis. There are no enlarged lymph nodes, but  there is an increased number of visible subcentimeter lymph nodes along the gastrohepatic ligament, specifically on 2: 21-023, which could be reactive or neoplastic. Reproductive: 1.8 cm calcified uterine fibroid. The uterus is not enlarged. Unremarkable atrophic ovaries. Other: Small umbilical fat hernia. Small inguinal fat hernias. No free fluid. Musculoskeletal: Osteopenia and degenerative change. Chronic Pagetoid change right hemipelvis. No acute or other significant osseous findings or new abnormality. IMPRESSION: 1. Moderate circumferential thickening of the gastric body with disproportionate mucosal enhancement along the thickening. Findings consistent with known gastric carcinoma. 2. Hazy interstitial change in the adjacent fat along the lesser gastric curve, which could be due to inflammation or lymphangitic carcinomatosis. 3. Increased number of visible subcentimeter lymph nodes along the gastrohepatic ligament, which could be reactive or neoplastic. 4. No bowel obstruction or inflammation. 5. Diverticulosis without evidence of diverticulitis. 6. Aortic and coronary artery atherosclerosis. 7. Small hiatal hernia.  Small umbilical and inguinal fat hernias. 8. Osteopenia and degenerative change. 9. Chronic Pagetoid change right hemipelvis. Aortic Atherosclerosis (ICD10-I70.0). Electronically Signed   By: Francis Quam M.D.   On: 03/15/2024 23:57    EKG: I independently viewed the EKG done and my findings are as followed: Normal sinus rhythm at rate of 84 bpm with VPCs  Assessment/Plan Present on Admission:  Intractable nausea and vomiting  Gastric adenocarcinoma (HCC)  Principal Problem:   Intractable nausea and vomiting Active Problems:   Gastric adenocarcinoma (HCC)   Intractable abdominal pain   Dehydration   Hypoalbuminemia due to protein-calorie malnutrition (HCC)  Intractable nausea and vomiting This is possibly due to patient's history of gastric carcinoma Continue IV Compazine   p.r.n. Continue IV hydration Patient will be started on clear liquid diet in the morning with plan to advance as tolerated  Intractable abdominal pain Continue IV morphine  2 mg q.4h p.r.n. for moderate to severe pain Continue clear liquid diet with plan to advance diet as tolerated  Dehydration IV hydration were provided  Hypoalbuminemia possibly secondary to moderate protein calorie malnutrition Albumin 3.0, protein supplement will be provided  History of gastric Adenocarcinoma Patient was not interested in getting chemotherapy or radiation She follows with Dr.  Davonna Last office visit was 03/14/2024   DVT prophylaxis: Lovenox    Family Communication: None at bedside   Advance Care Planning:   Code Status: Full Code  Consults: None  Severity of Illness: The appropriate patient status for this patient is INPATIENT. Inpatient status is judged to be reasonable and necessary in order to provide the required intensity of service to ensure the patient's safety. The patient's presenting symptoms, physical exam findings, and initial radiographic and laboratory data in the context of their chronic comorbidities is felt to place them at high risk for further clinical deterioration. Furthermore, it is not anticipated that the patient will be medically stable for discharge from the hospital within 2 midnights of admission.   * I certify that at the point of admission it is my clinical judgment that the patient will require inpatient hospital care spanning beyond 2 midnights from the point of admission due to high intensity of service, high risk for further deterioration and high frequency of surveillance required.*  Author: Dyamond Tolosa, DO 03/16/2024 7:08 AM  For on call review [email or web address].

## 2024-03-16 NOTE — ED Notes (Signed)
 ED Provider at bedside.

## 2024-03-16 NOTE — [place] FL2 (Signed)
 [place]  MEDICAID [id number] LEVEL OF CARE FORM     IDENTIFICATION  Patient Name: Melissa Ross Birthdate: Sep 22, 1930 Sex: female Admission Date (Current Location): 03/15/2024  [facility] and [place] Number:  [employer] and Address:  [facility],  [address] [place] [address]      Provider Number: [id number]  Attending Physician Name and Address:  Pearlean Manus, MD  Relative Name and Phone Number:  [name], Keil [phone])  [phone]    Current Level of Care: Hospital Recommended Level of Care: Skilled Nursing Facility Prior Approval Number:    Date Approved/Denied:   PASRR Number: [id number]  Discharge Plan: SNF    Current Diagnoses: Patient Active Problem List   Diagnosis Date Noted   Intractable nausea and vomiting 03/16/2024   Intractable abdominal pain 03/16/2024   Dehydration 03/16/2024   Hypoalbuminemia due to protein-calorie malnutrition (HCC) 03/16/2024   Erythrocytosis 03/14/2024   Gastric adenocarcinoma (HCC) 03/14/2024   Acute gastric ulcer without hemorrhage or perforation 03/01/2024   Pyloric stenosis 03/01/2024   Abnormal CT of the abdomen 09/07/2023   Essential hypertension 08/10/2023   Pancreatic cyst 08/10/2023   Elevated alkaline phosphatase level 06/15/2023   Gastroesophageal reflux disease 06/15/2023   Projectile vomiting with nausea 06/15/2023   Protein-calorie malnutrition, severe 09/12/2015   Syncopal episodes 09/11/2015   Chronic leg pain 09/11/2015   Dyslipidemia 09/11/2015   Subacute Bronchitis 09/11/2015   History of Respiratory failure requiring intubation (HCC) 09/11/2015   CAD (coronary artery disease) 09/11/2015   Constipation 08/25/2015   HTN (hypertension) 07/18/2012    Orientation RESPIRATION BLADDER Height & Weight     Self, Time, Situation, Place  Normal External catheter Weight: 64.7 kg Height:  5' 5 (165.1 cm)  BEHAVIORAL SYMPTOMS/MOOD NEUROLOGICAL BOWEL NUTRITION STATUS       Continent Diet (See DC summary)  AMBULATORY STATUS COMMUNICATION OF NEEDS Skin   Extensive Assist Verbally Bruising                       Personal Care Assistance Level of Assistance  Bathing, Feeding, Dressing Bathing Assistance: Maximum assistance Feeding assistance: Limited assistance Dressing Assistance: Maximum assistance     Functional Limitations Info  Sight, Speech, Hearing Sight Info: Impaired Hearing Info: Impaired Speech Info: Adequate    SPECIAL CARE FACTORS FREQUENCY  PT (By licensed PT)     PT Frequency: 5 times a week              Contractures Contractures Info: Not present    Additional Factors Info  Code Status, Allergies Code Status Info: DNR Allergies Info: Bacitracin, neosporin, polymyxin, penicillins           Current Medications (03/16/2024):  This is the current hospital active medication list Current Facility-Administered Medications  Medication Dose Route Frequency Provider Last Rate Last Admin   acetaminophen  (TYLENOL ) tablet 650 mg  650 mg Oral Q6H PRN Adefeso, Oladapo, DO       Or   acetaminophen  (TYLENOL ) suppository 650 mg  650 mg Rectal Q6H PRN Adefeso, Oladapo, DO       feeding supplement (BOOST / RESOURCE BREEZE) liquid 1 Container  1 Container Oral Q24H Emokpae, Courage, MD       feeding supplement (ENSURE PLUS HIGH PROTEIN) liquid 237 mL  237 mL Oral BID BM Adefeso, Oladapo, DO   237 mL at 03/16/24 0814   lactated ringers  infusion   Intravenous Continuous Adefeso, Oladapo, DO 70 mL/hr at 03/16/24 0814 New Bag at  03/16/24 0814   LORazepam  (ATIVAN ) injection 0.5 mg  0.5 mg Intravenous Q4H PRN Cleotilde Laymon HERO, NP       morphine  (PF) 2 MG/ML injection 2 mg  2 mg Intravenous Q4H PRN Adefeso, Oladapo, DO       OLANZapine  (ZYPREXA ) tablet 2.5 mg  2.5 mg Oral QHS Cleotilde Laymon HERO, NP       prochlorperazine  (COMPAZINE ) injection 10 mg  10 mg Intravenous Q6H PRN Adefeso, Oladapo, DO   10 mg at 03/16/24 9185     Discharge  Medications: Please see discharge summary for a list of discharge medications.  Relevant Imaging Results:  Relevant Lab Results:   Additional Information SS#[id number]  Sharlyne Stabs, RN

## 2024-03-16 NOTE — Plan of Care (Signed)
  Problem: Acute Rehab PT Goals(only PT should resolve) Goal: Pt Will Go Supine/Side To Sit Outcome: Progressing Flowsheets (Taken 03/16/2024 1214) Pt will go Supine/Side to Sit:  with contact guard assist  with minimal assist Goal: Patient Will Transfer Sit To/From Stand Outcome: Progressing Flowsheets (Taken 03/16/2024 1214) Patient will transfer sit to/from stand:  with contact guard assist  with minimal assist Goal: Pt Will Transfer Bed To Chair/Chair To Bed Outcome: Progressing Flowsheets (Taken 03/16/2024 1214) Pt will Transfer Bed to Chair/Chair to Bed:  with contact guard assist  with min assist Goal: Pt Will Ambulate Outcome: Progressing Flowsheets (Taken 03/16/2024 1214) Pt will Ambulate:  25 feet  with minimal assist  with rolling walker   12:14 PM, 03/16/24 Lynwood Music, MPT Physical Therapist with [facility] 336 [phone] office [phone] mobile phone

## 2024-03-16 NOTE — Progress Notes (Signed)
 Initial Nutrition Assessment  DOCUMENTATION CODES:   Severe malnutrition in context of acute illness/injury  INTERVENTION:   Continue Ensure Plus High Protein po BID, each supplement provides 350 kcal and 20 grams of protein  Boost Breeze po once daily, each supplement provides 250 kcal and 9 grams of protein  NUTRITION DIAGNOSIS:   Severe Malnutrition related to acute illness (newly diagnosed gastric cancer) as evidenced by energy intake < or equal to 50% for > or equal to 5 days, percent weight loss (5.4% weight loss x 1 month).  GOAL:   Patient will meet greater than or equal to 90% of their needs  MONITOR:   PO intake, Supplement acceptance, Diet advancement  REASON FOR ASSESSMENT:   Malnutrition Screening Tool    ASSESSMENT:   89 yo female admitted with intractable N/V. PMH includes HTN, HLD, CAD, asthma, recently diagnosed stomach cancer.  Patient has had difficulty with food and fluid intake for several months with ongoing nausea and vomiting. She has lost a lot of weight over the past year. Currently on a clear liquid diet. Tolerating okay. She consumed 65% of breakfast today. Also being offered Ensure Plus High Protein BID.  Weight history reviewed. She has had 5.4% weight loss over the past month, which is severe for the time frame. Intake for the past month has been minimal d/t nausea and vomiting. Patient meets criteria for severe malnutrition, given intake meeting </=50% of estimated energy requirement for >/= 5 days and > 5% weight loss x 1 month.  Labs and medications reviewed.  Receiving IV compazine  prn for nausea. IVF: LR at 70 ml/h  Diet Order:   Diet Order             Diet clear liquid Room service appropriate? Yes; Fluid consistency: Thin  Diet effective now                   EDUCATION NEEDS:   Not appropriate for education at this time  Skin:  Skin Assessment: Reviewed RN Assessment  Last BM:  8/28  Height:   Ht Readings from Last  1 Encounters:  03/15/24 5' 5 (1.651 m)    Weight:   Wt Readings from Last 1 Encounters:  03/16/24 64.7 kg    Ideal Body Weight:  56.8 kg  BMI:  Body mass index is 23.74 kg/m.  Estimated Nutritional Needs:   Kcal:  1700-1900  Protein:  80-95 gm  Fluid:  1.7-1.9 L   Suzen HUNT RD, LDN, CNSC Contact via secure chat. If unavailable, use group chat RD Inpatient.

## 2024-03-16 NOTE — TOC CM/SW Note (Signed)
 Spoke c/Misty at [facility] about bed availability and presented possible options to pt's son [name] who states family would prefer the [facility]. Notified [facility] of family's decision and they will evaluate pt tomorrow for inpatient hospice.

## 2024-03-16 NOTE — Consult Note (Signed)
 Consultation Note Date: 03/16/2024   Patient Name: Melissa Ross  DOB: 29-Apr-1931  MRN: [id number]  Age / Sex: 89 y.o., female  PCP: Melissa Leta NOVAK, MD Referring Physician: Pearlean Manus, MD  Reason for Consultation: Establishing goals of care  HPI/Patient Profile: 89 y.o. female  with past medical history of hypertension, hyperlipidemia, CAD, and recently diagnosed gastric adenocarcinoma admitted on 03/15/2024 with intractable N/V and abd pain.   Patient was admitted to the hospital for intractable nausea and vomiting in the setting of gastric carcinoma.  CT scan negative for obstruction.  She also has some intractable abdominal pain.  She has declined chemotherapy or radiation.  Family is considering transition to comfort care.  PMT has been consulted to assist with goals of care conversation.  Today, labs and diagnostics independently reviewed.  Renal function stable/improving with creatinine going from 1.20 on admission to 1.12 today.  Albumin level low at 3.  CBC reviewed and reveals normal white count and stable H&H.  CT abdomen and pelvis independently reviewed thickening of gastric body noted as well as some possible inflammatory changes noted to gastric curvature.  Some visible lymph nodes in the area as well. EKG independently reviewed.  Some artifact present but otherwise appears to be in sinus rhythm.  QTc 476.  Vital signs have been relatively stable with some intermittent hypertension.  Patient has required IV Zofran , rectal Compazine , and IV Compazine  for nausea and continues to complain of persistent nausea.  She did have 5 mg of oxycodone  administered for pain on 24-hour look back.  When patient is aroused, she denies any pain but does report some persistent nausea and severe nausea.  She also states she is tired.  Nursing staff state that patient continues to complain of significant nausea despite receiving numerous different antiemetics.  They  also state that she has had little to no p.o. intake today.   Clinical Assessment and Goals of Care:  I have reviewed medical records including EPIC notes, labs and imaging (independently reviewed), medication administration record, vital signs, assessed the patient and then met with patient and her son Melissa Ross) at bedside to discuss diagnosis prognosis, GOC, EOL wishes, disposition and options.  Independent history is obtained from nursing staff and patient's son as she is lethargic and unable to significantly participate in history taking.  Collaborated directly with attending physician, bedside nursing staff, TOC.   I introduced Palliative Medicine as specialized medical care for people living with serious illness. It focuses on providing relief from the symptoms and stress of a serious illness. The goal is to improve quality of life for both the patient and the family.  The family consented to a voluntary Advance Care Planning Conversation in person. Individuals present for the conversation: this NP, patient, and son Melissa Ross)  We discussed a brief life review of the patient and then focused on their current illness.   I attempted to elicit values and goals of care important to the patient.    Medical History Review and Family/Patient Understanding:   I spoke at length with son regarding the seriousness of her current illness especially in light of her advanced age and comorbidities (including recent diagnosis of gastric cancer with possible progression to lymph node involvement), discussed that patient appears to be in terminal phase of her illness. Patient's son has a good understanding of patient's current health condition.  Social History:  Patient has been married to her husband for 67 years.  They have 2 sons both  sons are supportive however, 1 son is only really able to meaningfully participate in care as the other time had a stroke that left him with significant  deficits.  Patient's husband has dementia and requires care as well.  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    Functional and Nutritional State:  Patient currently lives at home with her husband however, they have significant assistance.  Patient's son has hired 2 caregivers who help in the home and he states his parents have both needed more care over the past 10 years but that over the past 3 months care needs have increased and he has basically been living in the home helping care for his parents most recently his mother who seems to have decline more rapidly.  He shares she has been struggling greatly with nausea and vomiting and over the past few days has been so weak she can barely get out of bed.  Prior to this hospital stay, she was able to toilet herself but cannot really eat as everything she eats causes her to have nausea and vomiting.  He states that she has lost a significant amount of weight over the past year he estimates  over 100 pounds.    Palliative Symptoms:  Abdominal pain Intractable nausea and vomiting  Advance Directives/Goals of Care/Anticipatory care planning Discussion:  A detailed discussion regarding GOC, advanced directives, and anticipatory care planning was had.  Long discussion was had with patient's son.  With education, he understands that patient is gravely and terminally ill.  At this point, his main goal is for her to be comfortable and have good quality of life.  He shares that she has expressed that she does not want to go through chemo or radiation and does not want to be kept alive by machines.  He states that she has said that she is tired of being sick like this and ready to just be at peace.  Given patient's goals of care and terminal status, offered hospice.  Reviewed hospice philosophy of care, including focus on comfort, quality of life, and support for patients and families facing  terminal illness. Discussed eligibility criteria and potential benefits of enrollment.  Patient's son is interested in pursuing hospice.  We discussed the various locations where hospice care can be provided and the benefits and drawbacks of care being provided in these locations.  Patient's son expresses a concern about his ability to continue to manage her symptoms at home.  He shares that she has been absolutely miserable and he has had a very hard time lately keeping her nausea under control and that he does not want to bring her home and have her be persistently miserable related to the intractable symptoms.  He also shares how it has been increasingly difficult for him to care for her at home due to progressive weakness.  Will reach out to [facility] to discuss disposition options.  Given where patient is now I let son know that she may ultimately end up being appropriate for inpatient hospice. Engaged in discussion of advance directives including establishing a healthcare proxy.  Patient son states that his father is unable to be healthcare proxy due to his dementia and his brother has suffered a stroke so he has been making medical decisions for patient if she is unable to.  He is interested in having spiritual care consult for spiritual support as well as healthcare power of  attorney documentation.  We also discussed the limitations and potential burdens of CPR and intubation, particularly in the context of advanced age and serious underlying health conditions.  Patient son indicates that she said she would not want this.  Therefore, in agreement with transitioning to DNR/DNI. The difference between aggressive medical intervention and comfort care was considered in light of the patient's goals of care.  Son is interested in comfort care.  Extensively reviewed the components of inpatient comfort care, including cessation of non-essential interventions (e.g., labs, CBGs, telemetry, frequent vitals), discontinuation  of non-comfort medications, and initiation of medications to manage symptoms and promote comfort.   Documents completed as a result of this discussion: MOST/golden rod. Reviewed the MOST form in detail with the patient, including how its components reflect his care preferences. Golden rod form also completed. Documentation was finalized and scanned into the electronic medical record.   I completed a MOST form today. The patient and family outlined their wishes for the following treatment decisions:  Cardiopulmonary Resuscitation: Do Not Attempt Resuscitation (DNR/No CPR)  Medical Interventions: Comfort Measures: Keep clean, warm, and dry. Use medication by any route, positioning, wound care, and other measures to relieve pain and suffering. Use oxygen , suction and manual treatment of airway obstruction as needed for comfort. Do not transfer to the hospital unless comfort needs cannot be met in current location.  Antibiotics: Determine use of limitation of antibiotics when infection occurs  IV Fluids: IV fluids for a defined trial period  Feeding Tube: No feeding tube     Discussed the importance of continued conversation with family and the medical providers regarding overall plan of care and treatment options, ensuring decisions are within the context of the patient's values and GOCs.   Questions and concerns were addressed. The family was encouraged to call with questions or concerns.  PMT will continue to support holistically.  Primary Decision maker and health care surrogate:  OTHER (patient's son Melissa Ross)   I spent 40 minutes providing separately identifiable ACP services with the patient and/or surrogate decision maker in a voluntary, in-person conversation discussing the patient's wishes and goals as detailed in the above note.   Code Status:  DNR/DNI/comfort care    SUMMARY OF RECOMMENDATIONS    Transition to DNR/DNI/comfort care Aggressive management of symptoms  including nausea Referral to hospice given goals of care and terminal prognosis, likely inpatient hospice given severity of symptoms and lack of p.o. intake Discontinue noncomfort medication and initiate comfort meds Palliative medicine team will continue to follow for ongoing goals of care discussion, symptom management, and coordination of care. Spiritual care consult for healthcare power of attorney and faith support  Code Status/Advance Care Planning: DNR   Symptom Management:  Continue fentanyl  25 to 50 mcg IV every 2 hours as needed Ordered olanzapine  2.5 mg tablet nightly for nausea/decreased appetite Continue Compazine  10 mg every 6 hours as needed nausea vomiting Ordered Ativan  0.5 mg IV every 4 hours as needed breakthrough nausea and vomiting  Psycho-social/Spiritual:  Desire for further Chaplaincy support:yes  Prognosis:  < 2 weeks  Discharge Planning: To be determined but most likely inpatient hospice      Primary Diagnoses: Present on Admission:  Intractable nausea and vomiting  Gastric adenocarcinoma (HCC)    Physical Exam Constitutional:      Appearance: She is ill-appearing.  HENT:     Mouth/Throat:     Mouth: Mucous membranes are dry.  Pulmonary:     Effort: Pulmonary effort is normal.  No respiratory distress.  Skin:    Coloration: Skin is pale.  Neurological:     Mental Status: She is lethargic.     Vital Signs: BP 124/69 (BP Location: Right Arm)   Pulse 94   Temp 97.7 F (36.5 C)   Resp 15   Ht 5' 5 (1.651 m)   Wt 64.7 kg   SpO2 92%   BMI 23.74 kg/m  Pain Scale: 0-10   Pain Score: 4    SpO2: SpO2: 92 % O2 Device:SpO2: 92 % O2 Flow Rate: .O2 Flow Rate (L/min): 1 L/min   Palliative Assessment/Data: 10-20%     Billing based on MDM: High   Problems Addressed: One acute or chronic illness or injury that poses a threat to life or bodily function  Amount and/or Complexity of Data: Category 1:Assessment requiring an independent  historian(s), Category 2:Independent interpretation of a test performed by another physician/other qualified health care professional (not separately reported), and Category 3:Discussion of management or test interpretation with external physician/other qualified health care professional/appropriate source (not separately reported)  Risks: Parenteral controlled substances   Melissa CHRISTELLA Pinal, NP  Palliative Medicine Team Team phone # [phone]  Thank you for allowing the Palliative Medicine Team to assist in the care of this patient. Please utilize secure chat with additional questions, if there is no response within 30 minutes please call the above phone number.  Palliative Medicine Team providers are available by phone from 7am to 7pm daily and can be reached through the team cell phone.  Should this patient require assistance outside of these hours, please call the patient's attending physician.

## 2024-03-16 NOTE — Plan of Care (Signed)
   Problem: Activity: Goal: Risk for activity intolerance will decrease Outcome: Progressing   Problem: Coping: Goal: Level of anxiety will decrease Outcome: Progressing

## 2024-03-16 NOTE — Evaluation (Signed)
 Occupational Therapy Evaluation Patient Details Name: Melissa Ross MRN: [id number] DOB: [birth year] Today's Date: 03/16/2024   History of Present Illness   Melissa Ross is a 89 y.o. female with medical history significant of hypertension, hyperlipidemia, CAD, cancer of the stomach diagnosed by endoscopy few weeks ago who presents to the emergency department due to nausea and vomiting.  Vomitus was described as dark and green in color and was associated with generalized abdominal cramping.  She denies diarrhea, constipation, chest pain or shortness of breath (per DO)     Clinical Impressions Pt agreeable to OT and PT co-evaluation. No family present to confirm available assist at home. Pt reports she has needed increased assist in the past 3 months. Pt required min A for bed mobility and min to mod A for transfer to chair with RW and brief ambulation in the room. Pt seems at a level of mod to max A for lower body dressing and CGA for upper body ADL's. Pt is generally weak and limited by fatigue/nausea today. Pt left in the chair with call bell within reach and chair alarm set. Pt will benefit from continued OT in the hospital and recommended venue below to increase strength, balance, and endurance for safe ADL's.        If plan is discharge home, recommend the following:   A little help with walking and/or transfers;A lot of help with bathing/dressing/bathroom;Assistance with cooking/housework;Assist for transportation;Help with stairs or ramp for entrance     Functional Status Assessment   Patient has had a recent decline in their functional status and demonstrates the ability to make significant improvements in function in a reasonable and predictable amount of time.     Equipment Recommendations   None recommended by OT             Precautions/Restrictions   Precautions Precautions: Fall Recall of Precautions/Restrictions: Intact Restrictions Weight Bearing  Restrictions Per Provider Order: No     Mobility Bed Mobility Overal bed mobility: Needs Assistance Bed Mobility: Supine to Sit     Supine to sit: Min assist, HOB elevated     General bed mobility comments: mild labored effort; increased nausea.    Transfers Overall transfer level: Needs assistance Equipment used: Rolling walker (2 wheels) Transfers: Sit to/from Stand, Bed to chair/wheelchair/BSC Sit to Stand: Min assist, Mod assist     Step pivot transfers: Min assist, Mod assist     General transfer comment: EOB to chair with RW; slow and labored movement      Balance Overall balance assessment: Needs assistance Sitting-balance support: No upper extremity supported, Feet supported Sitting balance-Leahy Scale: Poor Sitting balance - Comments: noted to return to supine after a few minutes of sitting upright   Standing balance support: Bilateral upper extremity supported, During functional activity, Reliant on assistive device for balance Standing balance-Leahy Scale: Poor Standing balance comment: poor to fair with RW                           ADL either performed or assessed with clinical judgement   ADL Overall ADL's : Needs assistance/impaired     Grooming: Set up;Sitting;Contact guard assist   Upper Body Bathing: Contact guard assist;Sitting;Minimal assistance   Lower Body Bathing: Moderate assistance;Maximal assistance;Sitting/lateral leans   Upper Body Dressing : Contact guard assist;Set up;Sitting   Lower Body Dressing: Moderate assistance;Maximal assistance;Sitting/lateral leans Lower Body Dressing Details (indicate cue type and reason): Able to doff sock  but required assist to don while seated at the EOB. Toilet Transfer: Minimal assistance;Moderate assistance;Stand-pivot;Rolling walker (2 wheels) Toilet Transfer Details (indicate cue type and reason): EOB to chair with RW Toileting- Clothing Manipulation and Hygiene: Moderate  assistance;Maximal assistance;Sitting/lateral lean       Functional mobility during ADLs: Minimal assistance;Moderate assistance;Rolling walker (2 wheels) General ADL Comments: Pt able to take a few steps forward and backward from chair.     Vision Baseline Vision/History: 1 Wears glasses Ability to See in Adequate Light: 2 Moderately impaired (Pt reports poor vision with and without glasses.) Patient Visual Report: No change from baseline (reported she did not know if she had any acute vision changes) Vision Assessment?: No apparent visual deficits     Perception Perception: Not tested       Praxis Praxis: Not tested       Pertinent Vitals/Pain Pain Assessment Pain Assessment:  (nauseous)     Extremity/Trunk Assessment Upper Extremity Assessment Upper Extremity Assessment: Generalized weakness   Lower Extremity Assessment Lower Extremity Assessment: Defer to PT evaluation   Cervical / Trunk Assessment Cervical / Trunk Assessment: Kyphotic   Communication Communication Communication: Impaired Factors Affecting Communication: Hearing impaired   Cognition Arousal: Lethargic Behavior During Therapy: WFL for tasks assessed/performed Cognition: No apparent impairments                               Following commands: Intact       Cueing  General Comments   Cueing Techniques: Verbal cues;Tactile cues                 Home Living Family/patient expects to be discharged to:: Private residence Living Arrangements: Spouse/significant other Available Help at Discharge: Family;Available 24 hours/day;Personal care attendant (Pt reports family can help 24/7 if needed, but later reported that her son has cancer. Also stated that she cares for her husband.) Type of Home: House Home Access: Stairs to enter   Entrance Stairs-Rails: None Home Layout: One level     Bathroom Shower/Tub: Producer, television/film/video: Handicapped height Bathroom  Accessibility: No   Home Equipment: Shower seat - built in;Grab bars - tub/shower;Cane - single Librarian, academic (2 wheels);Wheelchair - manual   Additional Comments: Care attendant M, W, F for 4 hours. PCA typically cooks and cleans.      Prior Functioning/Environment Prior Level of Function : Needs assist       Physical Assist : ADLs (physical);Mobility (physical) Mobility (physical): Transfers;Gait;Stairs;Bed mobility ADLs (physical): Bathing;Dressing;IADLs Mobility Comments: Pt reports she only ambulates with supervision and SPC since she has been weak over the past 3 months. Assisted out of bed recently as well. ADLs Comments: Reports shower transfer assist and dressing assist. Assisted IADL's by PCA.    OT Problem List: Decreased strength;Decreased activity tolerance;Impaired balance (sitting and/or standing)   OT Treatment/Interventions: Self-care/ADL training;Therapeutic exercise;DME and/or AE instruction;Therapeutic activities;Patient/family education;Energy conservation;Balance training      OT Goals(Current goals can be found in the care plan section)   Acute Rehab OT Goals Patient Stated Goal: improve function OT Goal Formulation: With patient Time For Goal Achievement: 03/30/24 Potential to Achieve Goals: Good   OT Frequency:  Min 3X/week    Co-evaluation PT/OT/SLP Co-Evaluation/Treatment: Yes Reason for Co-Treatment: To address functional/ADL transfers   OT goals addressed during session: ADL's and self-care  End of Session Equipment Utilized During Treatment: Rolling walker (2 wheels)  Activity Tolerance: Patient limited by fatigue;Other (comment) (nausea) Patient left: in chair;with call bell/phone within reach;with chair alarm set  OT Visit Diagnosis: Unsteadiness on feet (R26.81);Other abnormalities of gait and mobility (R26.89);Muscle weakness (generalized) (M62.81)                Time: 9179-9159 OT Time  Calculation (min): 20 min Charges:  OT General Charges $OT Visit: 1 Visit OT Evaluation $OT Eval Low Complexity: 1 Low  Davida Falconi OT, MOT  Jayson Person 03/16/2024, 11:10 AM

## 2024-03-16 NOTE — Progress Notes (Addendum)
 PROGRESS NOTE  Melissa Ross, is a 89 y.o. female, DOB - January 08, 1931, FMW:[id number]  Admit date - 03/15/2024   Admitting Physician Posey Maier, DO  Outpatient Primary MD for the patient is Melissa Leta NOVAK, MD  LOS - 0  Chief Complaint  Patient presents with   Emesis      Brief Narrative:  89 y.o. female with medical history significant of hypertension, hyperlipidemia, CAD, cancer of the stomach diagnosed by endoscopy few weeks ago admitted on 03/16/24 with persistent emesis  Assessment and Plan: 1)Intractable Nausea and Vomiting in the setting of Gastric Carcinoma- CT AP---w/o obstruction C/n anti-emetics  Nausea is persistent ----unable to advance diet    2)Intractable Abdominal Pain - PRn oxycodone  as needed moderate pain and IV fentanyl  as needed severe pain Continue clear liquid diet with plan to advance diet as tolerated   3) protein caloric malnutrition/hypoalbuminemia--nutritional supplements as advised  4)History of Gastric Adenocarcinoma Patient was not interested in getting chemotherapy or radiation She follows with Dr. Davonna Last office visit was 03/14/2024  5)Social/Ethics--- palliative care consult appreciated - Patient and son Melissa Ross request DNR/DNI status and transition to comfort care -- They are interested in hospice involvement -- Given poor oral intake she may be a candidate for residential hospice  6) generalized weakness and deconditioning--- PT OT eval appreciated  -- Patient seen and evaluated, chart reviewed, please see EMR for updated orders. Please see full H&P dictated by admitting physician Dr. Adefeso for same date of service.  Total care time 43 minutes  Status is: Inpatient   Disposition: The patient is from: Home              Anticipated d/c is to: TBD              Anticipated d/c date is: 2 days              Patient currently is not medically stable to d/c. Barriers: Not Clinically Stable-   Code Status :  -  Code Status: Do not  attempt resuscitation (DNR) - Comfort care   Family Communication:   Discussed with Son Melissa Ross at bedside  DVT Prophylaxis  :   - SCDs   SCDs Start: 03/16/24 0430   Lab Results  Component Value Date   PLT 259 03/16/2024   Inpatient Medications  Scheduled Meds:  feeding supplement  1 Container Oral Q24H   feeding supplement  237 mL Oral BID BM   OLANZapine   2.5 mg Oral QHS   Continuous Infusions:  lactated ringers  70 mL/hr at 03/16/24 0814   PRN Meds:.acetaminophen  **OR** acetaminophen , LORazepam , morphine  injection, prochlorperazine    Anti-infectives (From admission, onward)    None      Subjective: Melissa Ross today has no fevers,  No chest pain,    - Son at bedside - Nausea persist, emesis not as severe anymore - Oral intake remains poor  Patient seen and evaluated, chart reviewed, please see EMR for updated orders. Please see full H&P dictated by admitting physician Dr. Adefeso for same date of service.  Total care time 43 minutes  Objective: Vitals:   03/16/24 0200 03/16/24 0208 03/16/24 0545 03/16/24 1343  BP:  (!) 188/82 124/69 137/60  Pulse:  85 94 73  Resp:  20 15 16   Temp:  98.4 F (36.9 C) 97.7 F (36.5 C) 98.3 F (36.8 C)  TempSrc:    Oral  SpO2:  98% 92% 95%  Weight: 64.7 kg     Height:  Intake/Output Summary (Last 24 hours) at 03/16/2024 1418 Last data filed at 03/16/2024 0900 Gross per 24 hour  Intake 240 ml  Output 300 ml  Net -60 ml   Filed Weights   03/15/24 2057 03/16/24 0200  Weight: 63.7 kg 64.7 kg    Physical Exam Gen:- Awake Alert,  in no apparent distress  HEENT:- [place].AT, No sclera icterus Neck-Supple Neck,No JVD,.  Lungs-  CTAB , fair symmetrical air movement CV- S1, S2 normal, regular  Abd-  +ve B.Sounds, Abd Soft, No tenderness,    Extremity/Skin:- No  edema, pedal pulses present  Psych-affect is appropriate, oriented x3 Neuro-no new focal deficits, no tremors  Data Reviewed: I have personally reviewed  following labs and imaging studies  CBC: Recent Labs  Lab 03/14/24 1143 03/15/24 2131 03/16/24 0528  WBC 5.6 4.9 5.6  NEUTROABS 3.0  --   --   HGB 16.1* 14.5 13.7  HCT 48.9* 45.2 42.0  MCV 93.3 96.0 93.5  PLT 316 220 259   Basic Metabolic Panel: Recent Labs  Lab 03/14/24 1143 03/15/24 2131 03/16/24 0528  NA 136 142 140  K 4.3 3.9 4.0  CL 101 106 108  CO2 23 26 25   GLUCOSE 130* 113* 119*  BUN 18 16 16   CREATININE 1.20* 1.14* 1.12*  CALCIUM  9.6 9.2 9.1  MG 1.9  --  1.8  PHOS  --   --  3.7   GFR: Estimated Creatinine Clearance: 30 mL/min (A) (by C-G formula based on SCr of 1.12 mg/dL (H)). Liver Function Tests: Recent Labs  Lab 03/14/24 1143 03/15/24 2131 03/16/24 0528  AST 18 15 16   ALT 12 9 11   ALKPHOS 127* 103 98  BILITOT 1.0 0.9 0.9  PROT 6.4* 5.3* 5.3*  ALBUMIN 3.6 3.0* 3.0*   Radiology Studies: CT ABDOMEN PELVIS W CONTRAST Result Date: 03/15/2024 CLINICAL DATA:  Abdominal pain, acute, nonlocalized. Bowel obstruction suspected. Stomach cancer unknown stage. EXAM: CT ABDOMEN AND PELVIS WITH CONTRAST TECHNIQUE: Multidetector CT imaging of the abdomen and pelvis was performed using the standard protocol following bolus administration of intravenous contrast. RADIATION DOSE REDUCTION: This exam was performed according to the departmental dose-optimization program which includes automated exposure control, adjustment of the mA and/or kV according to patient size and/or use of iterative reconstruction technique. CONTRAST:  OMNIPAQUE  IOHEXOL  300 MG/ML  SOLN COMPARISON:  CTs with IV contrast 08/16/2023 and 08/26/2021. FINDINGS: Lower chest: Chronic linear scar-like opacities both lower lobes. There is a chronic 3 mm noncalcified nodule laterally in the left lower lobe on the first image, and a stable chronic 4 mm left lower lobe nodule on 6:3. Benign process given length of stability. Also stable is a 5 mm medial basal right lower lobe nodule on 6:10 and a 2 mm  subpleural right middle lobe nodule anteriorly on 6:14. Hepatobiliary: No focal liver abnormality is seen. Status post cholecystectomy. No biliary dilatation. Pancreas: No abnormality. Spleen: No abnormality. Adrenals/Urinary Tract: There are occasional subcentimeter left renal cortical Bosniak 2 cysts which are too small to characterize. No follow-up imaging is recommended. There is no adrenal or renal mass enhancement, no urinary stone or obstruction. The bladder unremarkable for the degree of distention. Stomach/Bowel: Moderate circumferential thickening of the gastric body again is noted with disproportionate mucosal enhancement along the thickening. Just above this along the lesser gastric curve, there is hazy interstitial change in the adjacent fat, which could be due to inflammation or could be from lymphangitic carcinomatosis. Small hiatal hernia.  Normal caliber  unopacified small bowel. The appendix is not seen. There is diffuse diverticulosis of the colon with no evidence of acute diverticulitis. Vascular/Lymphatic: Heavy aortic and proximal visceral branch vessel atherosclerosis. No AAA. No dissection or aortic stenosis. There are no enlarged lymph nodes, but there is an increased number of visible subcentimeter lymph nodes along the gastrohepatic ligament, specifically on 2: 21-023, which could be reactive or neoplastic. Reproductive: 1.8 cm calcified uterine fibroid. The uterus is not enlarged. Unremarkable atrophic ovaries. Other: Small umbilical fat hernia. Small inguinal fat hernias. No free fluid. Musculoskeletal: Osteopenia and degenerative change. Chronic Pagetoid change right hemipelvis. No acute or other significant osseous findings or new abnormality. IMPRESSION: 1. Moderate circumferential thickening of the gastric body with disproportionate mucosal enhancement along the thickening. Findings consistent with known gastric carcinoma. 2. Hazy interstitial change in the adjacent fat along the  lesser gastric curve, which could be due to inflammation or lymphangitic carcinomatosis. 3. Increased number of visible subcentimeter lymph nodes along the gastrohepatic ligament, which could be reactive or neoplastic. 4. No bowel obstruction or inflammation. 5. Diverticulosis without evidence of diverticulitis. 6. Aortic and coronary artery atherosclerosis. 7. Small hiatal hernia.  Small umbilical and inguinal fat hernias. 8. Osteopenia and degenerative change. 9. Chronic Pagetoid change right hemipelvis. Aortic Atherosclerosis (ICD10-I70.0). Electronically Signed   By: Francis Quam M.D.   On: 03/15/2024 23:57   Scheduled Meds:  feeding supplement  1 Container Oral Q24H   feeding supplement  237 mL Oral BID BM   OLANZapine   2.5 mg Oral QHS   Continuous Infusions:  lactated ringers  70 mL/hr at 03/16/24 0814    LOS: 0 days   Rendall Carwin M.D on 03/16/2024 at 2:18 PM  Go to [email or web address] - for contact info  [facility] - Office  [phone]  If 7PM-7AM, please contact night-coverage [email or web address] 03/16/2024, 2:18 PM

## 2024-03-16 NOTE — Plan of Care (Signed)
  Problem: Acute Rehab OT Goals (only OT should resolve) Goal: Pt. Will Perform Grooming Flowsheets (Taken 03/16/2024 1112) Pt Will Perform Grooming:  with modified independence  standing Goal: Pt. Will Perform Upper Body Dressing Flowsheets (Taken 03/16/2024 1112) Pt Will Perform Upper Body Dressing:  with modified independence  sitting Goal: Pt. Will Perform Lower Body Dressing Flowsheets (Taken 03/16/2024 1112) Pt Will Perform Lower Body Dressing:  with modified independence  sitting/lateral leans Goal: Pt. Will Transfer To Toilet Flowsheets (Taken 03/16/2024 1112) Pt Will Transfer to Toilet:  with modified independence  ambulating Goal: Pt. Will Perform Toileting-Clothing Manipulation Flowsheets (Taken 03/16/2024 1112) Pt Will Perform Toileting - Clothing Manipulation and hygiene:  with modified independence  sitting/lateral leans  sit to/from stand Goal: Pt/Caregiver Will Perform Home Exercise Program Flowsheets (Taken 03/16/2024 1112) Pt/caregiver will Perform Home Exercise Program:  Increased strength  Both right and left upper extremity  Independently  Jennifer Holland OT, MOT

## 2024-03-16 NOTE — TOC Initial Note (Signed)
 Transition of Care [facility]) - Initial/Assessment Note   Patient Details  Name: Melissa Ross MRN: [id number] Date of Birth: October 16, 1930  [facility] CM/SW Contact:    Nena LITTIE Coffee, RN Phone Number: 03/16/2024, 2:31 PM  Clinical Narrative:                 Pt from home c/husband who has dementia, they have supportive children nearby who assist.  PT recommending SNF.  Referral sent to [facility], auth started.   Expected Discharge Plan: Skilled Nursing Facility Barriers to Discharge: Continued Medical Work up  Patient Goals and CMS Choice Patient states their goals for this hospitalization and ongoing recovery are:: Return home CMS Medicare.gov Compare Post Acute Care list provided to:: Patient Choice offered to / list presented to : Patient, Adult Children [place] ownership interest in [facility].provided to:: Patient   Expected Discharge Plan and Services In-house Referral: Clinical Social Work Discharge Planning Services: CM Consult Post Acute Care Choice: Skilled Nursing Facility Living arrangements for the past 2 months: Single Family Home                 Prior Living Arrangements/Services Living arrangements for the past 2 months: Single Family Home Lives with:: Spouse Patient language and need for interpreter reviewed:: Yes Do you feel safe going back to the place where you live?: Yes      Need for Family Participation in Patient Care: Yes (Comment) Care giver support system in place?: Yes (comment)   Criminal Activity/Legal Involvement Pertinent to Current Situation/Hospitalization: No - Comment as needed  Activities of Daily Living   ADL Screening (condition at time of admission) Independently performs ADLs?: No Does the patient have a NEW difficulty with bathing/dressing/toileting/self-feeding that is expected to last >3 days?: Yes (Initiates electronic notice to provider for possible OT consult) Does the patient have a NEW difficulty with  getting in/out of bed, walking, or climbing stairs that is expected to last >3 days?: Yes (Initiates electronic notice to provider for possible PT consult) Does the patient have a NEW difficulty with communication that is expected to last >3 days?: No Is the patient deaf or have difficulty hearing?: Yes Does the patient have difficulty seeing, even when wearing glasses/contacts?: No Does the patient have difficulty concentrating, remembering, or making decisions?: No  Permission Sought/Granted Permission sought to share information with : Case Manager, Magazine features editor, Family Supports Permission granted to share information with : Yes, Verbal Permission Granted    Emotional Assessment Appearance:: Appears stated age Attitude/Demeanor/Rapport: Engaged Affect (typically observed): Appropriate Orientation: : Oriented to Self, Oriented to Place, Oriented to  Time, Oriented to Situation Alcohol / Substance Use: Not Applicable Psych Involvement: No (comment)  Admission diagnosis:  Epigastric pain [R10.13] History of stomach cancer [Z85.028] Intractable nausea and vomiting [R11.2] Patient Active Problem List   Diagnosis Date Noted   Intractable nausea and vomiting 03/16/2024   Intractable abdominal pain 03/16/2024   Dehydration 03/16/2024   Hypoalbuminemia due to protein-calorie malnutrition (HCC) 03/16/2024   Erythrocytosis 03/14/2024   Gastric adenocarcinoma (HCC) 03/14/2024   Acute gastric ulcer without hemorrhage or perforation 03/01/2024   Pyloric stenosis 03/01/2024   Abnormal CT of the abdomen 09/07/2023   Essential hypertension 08/10/2023   Pancreatic cyst 08/10/2023   Elevated alkaline phosphatase level 06/15/2023   Gastroesophageal reflux disease 06/15/2023   Projectile vomiting with nausea 06/15/2023   Protein-calorie malnutrition, severe 09/12/2015   Syncopal episodes 09/11/2015   Chronic leg pain 09/11/2015  Dyslipidemia 09/11/2015   Subacute Bronchitis  09/11/2015   History of Respiratory failure requiring intubation (HCC) 09/11/2015   CAD (coronary artery disease) 09/11/2015   Constipation 08/25/2015   HTN (hypertension) 07/18/2012   PCP:  [name], MD Pharmacy:   [facility] - [place], [place] - [address] [place] [address] Phone: [phone] Fax: [phone]  [facility] - [place], [place] - [address] [facility] [address] [place] [address] [place] [address] Phone: [phone] Fax: [phone]  Social Drivers of Health (SDOH) Social History: SDOH Screenings   Food Insecurity: No Food Insecurity (03/16/2024)  Housing: Low Risk  (03/16/2024)  Transportation Needs: No Transportation Needs (03/16/2024)  Utilities: Not At Risk (03/16/2024)  Depression (PHQ2-9): Low Risk  (03/14/2024)  Financial Resource Strain: Low Risk  (03/18/2020)   Received from [facility]  Social Connections: Unknown (03/16/2024)  Stress: No Stress Concern Present (03/18/2020)   Received from [facility]  Tobacco Use: Low Risk  (03/15/2024)  Health Literacy: Medium Risk (03/18/2020)   Received from [facility]   SDOH Interventions:   Readmission Risk Interventions    03/16/2024    2:29 PM  Readmission Risk Prevention Plan  Transportation Screening Complete  PCP or Specialist Appt within 5-7 Days Complete  Home Care Screening Complete  Medication Review (RN CM) Complete

## 2024-03-17 DIAGNOSIS — R112 Nausea with vomiting, unspecified: Secondary | ICD-10-CM | POA: Diagnosis not present

## 2024-03-17 MED ORDER — HYDROMORPHONE HCL 1 MG/ML IJ SOLN
1.0000 mg | Freq: Once | INTRAMUSCULAR | Status: AC
  Administered 2024-03-17: 1 mg via INTRAVENOUS
  Filled 2024-03-17: qty 1

## 2024-03-17 NOTE — Plan of Care (Signed)
   Problem: Education: Goal: Knowledge of General Education information will improve Description Including pain rating scale, medication(s)/side effects and non-pharmacologic comfort measures Outcome: Progressing

## 2024-03-17 NOTE — Progress Notes (Signed)
 Reported called to [employer] at [facility].

## 2024-03-17 NOTE — Discharge Instructions (Signed)
--   Discharged to residential hospice House with full comfort care measures

## 2024-03-17 NOTE — Progress Notes (Signed)
 Melissa Ross 89  [facility] Liaison Note  Referral received from [facility] for family interest in [facility].   Met with patient and family to explain services and hospice philosophy and all questions answered.   Hospice Home is able to accept patient this afternoon once consents are complete.   RN staff, you may call report at any time to [facility]  @ [phone], room is assigned when report is called.   Please leave IV intact and send completed DNR with patient.   Updated attending and [facility] manager and team via [employer].  Thank you for the opportunity to participate in this patient's care   Nat Babe, BSN, RN Hospice Nurse Liaison [phone]

## 2024-03-17 NOTE — TOC Progression Note (Signed)
 Transition of Care [facility]) - Progression Note    Patient Details  Name: Melissa Ross MRN: [id number] Date of Birth: Jan 22, 1931  Transition of Care [facility]) CM/SW Contact  Lorraine LILLETTE [place], [place] Phone Number: 03/17/2024, 1:09 PM  Clinical Narrative:    [facility] evaluated pt today and accepted to [facility] in [place].  NP with [facility] requested CSW facilitate transport to placement at [facility].  CSW called [facility] and arranged transport after completing Med Necessity form.  No further ICM needs at this time.    Expected Discharge Plan: Skilled Nursing Facility Barriers to Discharge: No Barriers Identified               Expected Discharge Plan and Services In-house Referral: Clinical Social Work Discharge Planning Services: CM Consult Post Acute Care Choice: Skilled Nursing Facility Living arrangements for the past 2 months: Single Family Home Expected Discharge Date: 03/17/24                                     Social Drivers of Health (SDOH) Interventions SDOH Screenings   Food Insecurity: No Food Insecurity (03/16/2024)  Housing: Low Risk  (03/16/2024)  Transportation Needs: No Transportation Needs (03/16/2024)  Utilities: Not At Risk (03/16/2024)  Depression (PHQ2-9): Low Risk  (03/14/2024)  Financial Resource Strain: Low Risk  (03/18/2020)   Received from [facility]  Social Connections: Unknown (03/16/2024)  Stress: No Stress Concern Present (03/18/2020)   Received from [facility]  Tobacco Use: Low Risk  (03/15/2024)  Health Literacy: Medium Risk (03/18/2020)   Received from [facility]    Readmission Risk Interventions    03/16/2024    2:29 PM  Readmission Risk Prevention Plan  Transportation Screening Complete  PCP or Specialist Appt within 5-7 Days Complete  Home Care Screening Complete  Medication Review (RN CM) Complete

## 2024-03-17 NOTE — Discharge Summary (Signed)
 Melissa Ross, is a 89 y.o. female  DOB Mar 13, 1931  MRN [id number].  Admission date:  03/15/2024  Admitting Physician  Posey Maier, DO  Discharge Date:  03/17/2024   Primary MD  [name], MD  Recommendations for primary care physician for things to follow:  -- Discharged to residential hospice House with full comfort care measures  Admission Diagnosis  Epigastric pain [R10.13] History of stomach cancer [Z85.028] Intractable nausea and vomiting [R11.2]   Discharge Diagnosis  Epigastric pain [R10.13] History of stomach cancer [Z85.028] Intractable nausea and vomiting [R11.2]    Principal Problem:   Intractable nausea and vomiting Active Problems:   Gastric adenocarcinoma (HCC)   Intractable abdominal pain   Dehydration   Hypoalbuminemia due to protein-calorie malnutrition [facility])      Past Medical History:  Diagnosis Date   Arthritis    Asthma    GERD (gastroesophageal reflux disease)    Hypertension    Myocardial infarction (HCC) 03/2013   Respiratory failure (HCC)    August 2016, requiring intubation   Ventral hernia     Past Surgical History:  Procedure Laterality Date   APPENDECTOMY     CATARACT EXTRACTION W/PHACO  11/09/2011   Procedure: CATARACT EXTRACTION PHACO AND INTRAOCULAR LENS PLACEMENT (IOC);  Surgeon: Oneil T. Roz, MD;  Location: [facility] ORS;  Service: Ophthalmology;  Laterality: Right;  CDE 10.01   CHOLECYSTECTOMY     COLONOSCOPY N/A 10/30/2015   Procedure: COLONOSCOPY;  Surgeon: Claudis RAYMOND Rivet, MD;  Location: [facility] ENDO SUITE;  Service: Endoscopy;  Laterality: N/A;  2:20 - moved to 4/20 @ 1:25   ESOPHAGOGASTRODUODENOSCOPY N/A 03/01/2024   Procedure: EGD (ESOPHAGOGASTRODUODENOSCOPY);  Surgeon: Cinderella Deatrice FALCON, MD;  Location: [facility] ENDO SUITE;  Service: Endoscopy;  Laterality: N/A;  945AM, OK RM 1-2   ESOPHAGOGASTRODUODENOSCOPY (EGD) WITH ESOPHAGEAL DILATION  05/11/2012    Procedure: ESOPHAGOGASTRODUODENOSCOPY (EGD) WITH ESOPHAGEAL DILATION;  Surgeon: Claudis RAYMOND Rivet, MD;  Location: [facility] ENDO SUITE;  Service: Endoscopy;  Laterality: N/A;   EYE SURGERY     left KPE   INSERTION OF MESH  07/17/2012   Procedure: INSERTION OF MESH;  Surgeon: Camellia CHRISTELLA Blush, MD,FACS;  Location: [facility] OR;  Service: General;  Laterality: N/A;   LEFT HEART CATHETERIZATION WITH CORONARY ANGIOGRAM N/A 03/19/2013   Procedure: LEFT HEART CATHETERIZATION WITH CORONARY ANGIOGRAM;  Surgeon: Rober LOISE Chroman, MD;  Location: [facility] CATH LAB;  Service: Cardiovascular;  Laterality: N/A;   TOE SURGERY     right foot   TONSILLECTOMY     TUBAL LIGATION     VENTRAL HERNIA REPAIR  07/17/2012   Procedure: LAPAROSCOPIC VENTRAL HERNIA;  Surgeon: Camellia CHRISTELLA Blush, MD,FACS;  Location: [facility] OR;  Service: General;  Laterality: N/A;     HPI  from the history and physical done on the day of admission:    HPI: Melissa Ross is a 89 y.o. female with medical history significant of hypertension, hyperlipidemia, CAD, cancer of the stomach diagnosed by endoscopy few weeks ago who presents to the emergency department  due to nausea and vomiting.  Vomitus was described as dark and green in color and was associated with generalized abdominal cramping.  She denies diarrhea, constipation, chest pain or shortness of breath   ED Course:  In the emergency department, BP was 162/65, other vital signs were within normal range.  Workup in the ED showed normal CBC and BMP except for blood glucose of 113, creatinine 1.14, albumin 3.0, lipase 48, urinalysis was normal. CT abdomen and pelvis with contrast showed moderate circumferential thickening of the gastric body with disproportionate mucosal enhancement along the thickening. Findings consistent with known gastric carcinoma. Patient was traveling morphine , Zofran .  [facility] was asked to admit patient   Review of Systems: Review of systems as noted in the HPI. All other systems reviewed and are  negative.   Hospital Course:   Brief Narrative:  89 y.o. female with medical history significant of hypertension, hyperlipidemia, CAD, cancer of the stomach diagnosed by endoscopy few weeks ago admitted on 03/16/24 with persistent emesis   Assessment and Plan: 1)Intractable Nausea and Vomiting in the setting of Gastric Carcinoma- CT [facility]---w/o obstruction C/n anti-emetics  Nausea is persistent ----unable to advance diet  -- Patient and family have decided to transition to full comfort care and discharge to residential hospice house   2)Intractable Abdominal Pain - PRn pain meds- -- Patient and family have decided to transition to full comfort care and discharge to residential hospice house   3)Protein caloric malnutrition/hypoalbuminemia--nutritional supplements as tolerated   4)History of Gastric Adenocarcinoma Patient was not interested in getting chemotherapy or radiation She follows with Dr. Davonna Last office visit was 03/14/2024 --- Patient and family have decided to transition to full comfort care and discharge to residential hospice house   5)Social/Ethics--- palliative care consult appreciated - Patient and son [name] request DNR/DNI status and transition to comfort care  Given persistent abdominal pain, persistent emesis and inability to have oral intake----- Patient and family have decided to transition to full comfort care and discharge to residential hospice house    Discharge Condition: Overall prognosis is grave  Follow UP--- hospice team   Consults obtained -palliative and hospice team  Diet and Activity recommendation:  As advised  Discharge Instructions    Discharge Instructions     Diet general   Complete by: As directed    Discharge instructions   Complete by: As directed    -- Discharged to residential hospice House with full comfort care measures   Increase activity slowly   Complete by: As directed         Discharge Medications      Allergies as of 03/17/2024       Reactions   Bacitracin Other (See Comments)   Not known/From OSH records [facility]  Not known/From OSH records [facility]   Neosporin [neomycin-bacitracin Zn-polymyx] Other (See Comments)   blisters   Polymyxin B Other (See Comments)   Not known/From OSH records [facility]  Not known/From OSH records [facility]   Penicillins Swelling, Rash        Medication List     TAKE these medications    ondansetron  8 MG disintegrating tablet Commonly known as: ZOFRAN -ODT Take 1 tablet (8 mg total) by mouth every 8 (eight) hours as needed for nausea or vomiting.   pantoprazole  40 MG tablet Commonly known as: Protonix  Take 1 tablet (40 mg total) by mouth 2 (two) times daily.   promethazine  25 MG tablet Commonly known as: PHENERGAN  Take 1 tablet (  25 mg total) by mouth every 6 (six) hours as needed for nausea or vomiting.   zolpidem  5 MG tablet Commonly known as: AMBIEN  Take 1 tablet (5 mg total) by mouth at bedtime as needed for sleep.        Major procedures and Radiology Reports - PLEASE review detailed and final reports for all details, in brief -   CT ABDOMEN PELVIS W CONTRAST Result Date: 03/15/2024 CLINICAL DATA:  Abdominal pain, acute, nonlocalized. Bowel obstruction suspected. Stomach cancer unknown stage. EXAM: CT ABDOMEN AND PELVIS WITH CONTRAST TECHNIQUE: Multidetector CT imaging of the abdomen and pelvis was performed using the standard protocol following bolus administration of intravenous contrast. RADIATION DOSE REDUCTION: This exam was performed according to the departmental dose-optimization program which includes automated exposure control, adjustment of the mA and/or kV according to patient size and/or use of iterative reconstruction technique. CONTRAST:  OMNIPAQUE  IOHEXOL  300 MG/ML  SOLN COMPARISON:  CTs with IV contrast 08/16/2023 and 08/26/2021. FINDINGS: Lower chest: Chronic linear scar-like  opacities both lower lobes. There is a chronic 3 mm noncalcified nodule laterally in the left lower lobe on the first image, and a stable chronic 4 mm left lower lobe nodule on 6:3. Benign process given length of stability. Also stable is a 5 mm medial basal right lower lobe nodule on 6:10 and a 2 mm subpleural right middle lobe nodule anteriorly on 6:14. Hepatobiliary: No focal liver abnormality is seen. Status post cholecystectomy. No biliary dilatation. Pancreas: No abnormality. Spleen: No abnormality. Adrenals/Urinary Tract: There are occasional subcentimeter left renal cortical Bosniak 2 cysts which are too small to characterize. No follow-up imaging is recommended. There is no adrenal or renal mass enhancement, no urinary stone or obstruction. The bladder unremarkable for the degree of distention. Stomach/Bowel: Moderate circumferential thickening of the gastric body again is noted with disproportionate mucosal enhancement along the thickening. Just above this along the lesser gastric curve, there is hazy interstitial change in the adjacent fat, which could be due to inflammation or could be from lymphangitic carcinomatosis. Small hiatal hernia.  Normal caliber unopacified small bowel. The appendix is not seen. There is diffuse diverticulosis of the colon with no evidence of acute diverticulitis. Vascular/Lymphatic: Heavy aortic and proximal visceral branch vessel atherosclerosis. No AAA. No dissection or aortic stenosis. There are no enlarged lymph nodes, but there is an increased number of visible subcentimeter lymph nodes along the gastrohepatic ligament, specifically on 2: 21-023, which could be reactive or neoplastic. Reproductive: 1.8 cm calcified uterine fibroid. The uterus is not enlarged. Unremarkable atrophic ovaries. Other: Small umbilical fat hernia. Small inguinal fat hernias. No free fluid. Musculoskeletal: Osteopenia and degenerative change. Chronic Pagetoid change right hemipelvis. No acute or  other significant osseous findings or new abnormality. IMPRESSION: 1. Moderate circumferential thickening of the gastric body with disproportionate mucosal enhancement along the thickening. Findings consistent with known gastric carcinoma. 2. Hazy interstitial change in the adjacent fat along the lesser gastric curve, which could be due to inflammation or lymphangitic carcinomatosis. 3. Increased number of visible subcentimeter lymph nodes along the gastrohepatic ligament, which could be reactive or neoplastic. 4. No bowel obstruction or inflammation. 5. Diverticulosis without evidence of diverticulitis. 6. Aortic and coronary artery atherosclerosis. 7. Small hiatal hernia.  Small umbilical and inguinal fat hernias. 8. Osteopenia and degenerative change. 9. Chronic Pagetoid change right hemipelvis. Aortic Atherosclerosis (ICD10-I70.0). Electronically Signed   By: Francis Quam M.D.   On: 03/15/2024 23:57    Today   Subjective  Candis Macadam today has no new complaints - Female family friend at bedside, questions answered -- Patient and family have decided to transition to full comfort care and discharge to residential hospice house          Patient has been seen and examined prior to discharge   Objective   Blood pressure 135/66, pulse 95, temperature 98.4 F (36.9 C), temperature source Oral, resp. rate 16, height 5' 5 (1.651 m), weight 64.7 kg, SpO2 90%.   Intake/Output Summary (Last 24 hours) at 03/17/2024 1249 Last data filed at 03/17/2024 0830 Gross per 24 hour  Intake 590 ml  Output 200 ml  Net 390 ml    Exam Gen:- Awake Alert, no acute distress  HEENT:- [place].AT, No sclera icterus Neck-Supple Neck,No JVD,.  Lungs-  CTAB , good air movement bilaterally CV- S1, S2 normal, regular Abd-  +ve B.Sounds, Abd Soft, mild epigastric and periumbilical tenderness, no rebound or guarding Extremity/Skin:- No  edema,   good pulses Psych-affect is appropriate, oriented x3 Neuro-no new  focal deficits, no tremors    Data Review   CBC w Diff:  Lab Results  Component Value Date   WBC 5.6 03/16/2024   HGB 13.7 03/16/2024   HGB 12.2 10/20/2017   HCT 42.0 03/16/2024   HCT 37.4 10/20/2017   PLT 259 03/16/2024   PLT 266 10/20/2017   LYMPHOPCT 35 03/14/2024   MONOPCT 9 03/14/2024   EOSPCT 2 03/14/2024   BASOPCT 1 03/14/2024    CMP:  Lab Results  Component Value Date   NA 140 03/16/2024   NA 144 10/20/2017   K 4.0 03/16/2024   CL 108 03/16/2024   CO2 25 03/16/2024   BUN 16 03/16/2024   BUN 24 10/20/2017   CREATININE 1.12 (H) 03/16/2024   PROT 5.3 (L) 03/16/2024   PROT 6.9 09/27/2017   ALBUMIN 3.0 (L) 03/16/2024   ALBUMIN 4.4 09/27/2017   BILITOT 0.9 03/16/2024   BILITOT 0.7 09/27/2017   ALKPHOS 98 03/16/2024   AST 16 03/16/2024   ALT 11 03/16/2024  .  Total Discharge time is about 33 minutes  Rendall Carwin M.D on 03/17/2024 at 12:49 PM  Go to [email or web address] -  for contact info  [facility] - Office  [phone]

## 2024-03-22 ENCOUNTER — Ambulatory Visit (HOSPITAL_COMMUNITY): Admission: RE | Admit: 2024-03-22 | Source: Ambulatory Visit

## 2024-03-26 ENCOUNTER — Inpatient Hospital Stay: Admitting: Oncology

## 2024-04-11 DEATH — deceased
